# Patient Record
Sex: Female | Born: 1945
Health system: Southern US, Community
[De-identification: ages and names within clinical notes are randomized; demographics above are authoritative.]

## PROBLEM LIST (undated history)

## (undated) DIAGNOSIS — M8580 Other specified disorders of bone density and structure, unspecified site: Secondary | ICD-10-CM

## (undated) DIAGNOSIS — H04123 Dry eye syndrome of bilateral lacrimal glands: Secondary | ICD-10-CM

## (undated) DIAGNOSIS — M25559 Pain in unspecified hip: Secondary | ICD-10-CM

## (undated) DIAGNOSIS — C439 Malignant melanoma of skin, unspecified: Secondary | ICD-10-CM

## (undated) DIAGNOSIS — M810 Age-related osteoporosis without current pathological fracture: Secondary | ICD-10-CM

## (undated) DIAGNOSIS — R251 Tremor, unspecified: Secondary | ICD-10-CM

## (undated) DIAGNOSIS — I456 Pre-excitation syndrome: Secondary | ICD-10-CM

## (undated) DIAGNOSIS — R55 Syncope and collapse: Principal | ICD-10-CM

## (undated) HISTORY — DX: Syncope and collapse: R55

## (undated) HISTORY — PX: TONSILLECTOMY: SUR1361

## (undated) HISTORY — PX: BREAST BIOPSY: SHX20

## (undated) HISTORY — PX: SPINE SURGERY: SHX786

## (undated) HISTORY — DX: Age-related osteoporosis without current pathological fracture: M81.0

## (undated) HISTORY — PX: WISDOM TOOTH EXTRACTION: SHX21

## (undated) HISTORY — DX: Malignant melanoma of skin, unspecified: C43.9

## (undated) HISTORY — DX: Pre-excitation syndrome: I45.6

## (undated) HISTORY — DX: Tremor, unspecified: R25.1

## (undated) HISTORY — PX: TUBAL LIGATION: SHX77

## (undated) HISTORY — DX: Other specified disorders of bone density and structure, unspecified site: M85.80

## (undated) HISTORY — PX: BREAST CYST ASPIRATION: SHX578

## (undated) HISTORY — PX: BUNIONECTOMY: SHX129

## (undated) HISTORY — PX: COLONOSCOPY: SHX174

---

## 1973-10-12 HISTORY — PX: BREAST BIOPSY: SHX20

## 1973-10-12 HISTORY — PX: BREAST EXCISIONAL BIOPSY: SUR124

## 1975-10-13 HISTORY — PX: DILATION AND CURETTAGE OF UTERUS: SHX78

## 1990-10-12 DIAGNOSIS — I456 Pre-excitation syndrome: Secondary | ICD-10-CM

## 1990-10-12 HISTORY — PX: OTHER SURGICAL HISTORY: SHX169

## 1990-10-12 HISTORY — DX: Pre-excitation syndrome: I45.6

## 2009-02-05 ENCOUNTER — Ambulatory Visit: Payer: Self-pay | Admitting: Sports Medicine

## 2009-02-05 DIAGNOSIS — M21619 Bunion of unspecified foot: Secondary | ICD-10-CM

## 2009-02-05 DIAGNOSIS — M79609 Pain in unspecified limb: Secondary | ICD-10-CM | POA: Insufficient documentation

## 2009-02-05 DIAGNOSIS — M7750 Other enthesopathy of unspecified foot: Secondary | ICD-10-CM | POA: Insufficient documentation

## 2009-02-05 DIAGNOSIS — Q667 Congenital pes cavus, unspecified foot: Secondary | ICD-10-CM

## 2009-06-12 HISTORY — PX: MELANOMA EXCISION: SHX5266

## 2009-10-12 DIAGNOSIS — C439 Malignant melanoma of skin, unspecified: Secondary | ICD-10-CM

## 2009-10-12 HISTORY — DX: Malignant melanoma of skin, unspecified: C43.9

## 2013-05-19 ENCOUNTER — Encounter: Payer: Self-pay | Admitting: Obstetrics & Gynecology

## 2013-05-19 ENCOUNTER — Ambulatory Visit (INDEPENDENT_AMBULATORY_CARE_PROVIDER_SITE_OTHER): Payer: BC Managed Care – PPO | Admitting: Obstetrics & Gynecology

## 2013-05-19 ENCOUNTER — Other Ambulatory Visit: Payer: Self-pay | Admitting: Obstetrics & Gynecology

## 2013-05-19 VITALS — BP 126/82 | HR 60 | Resp 16 | Ht 63.75 in | Wt 107.6 lb

## 2013-05-19 DIAGNOSIS — Z1231 Encounter for screening mammogram for malignant neoplasm of breast: Secondary | ICD-10-CM

## 2013-05-19 DIAGNOSIS — Z124 Encounter for screening for malignant neoplasm of cervix: Secondary | ICD-10-CM

## 2013-05-19 DIAGNOSIS — Z1211 Encounter for screening for malignant neoplasm of colon: Secondary | ICD-10-CM

## 2013-05-19 DIAGNOSIS — C439 Malignant melanoma of skin, unspecified: Secondary | ICD-10-CM

## 2013-05-19 DIAGNOSIS — Z01419 Encounter for gynecological examination (general) (routine) without abnormal findings: Secondary | ICD-10-CM

## 2013-05-19 NOTE — Progress Notes (Signed)
67 y.o. G2P2 MarriedCaucasianF here for annual exam.  Moving back to [place] from [facility].  No vaginal bleeding.  Knows she needs colonoscopy.  Fine with me referring her.  Seeing Dr. Mila Palmer.  Has follow-up this fall.  H/O melanoma.  Sees dermatologist in [place]. [place] every six months.    Patient's last menstrual period was 10/12/1997.          Sexually active: yes  The current method of family planning is post menopausal status.    Exercising: yes  walking and weights Smoker:  no  Health Maintenance: Pap:  11/13/11 WNL History of abnormal Pap:  no MMG:  2012 normal Colonoscopy:  none BMD:   2012 osteopenia TDaP:  ? 10 years  Screening Labs: PCP, Hb today: PCP, Urine today: PCP   reports that she has never smoked. She has never used smokeless tobacco. She reports that she drinks about 3.5 ounces of alcohol per week. She reports that she does not use illicit drugs.  Past Medical History  Diagnosis Date  . Wolff-Parkinson-White (WPW) syndrome 1992    heart ablation  . Osteopenia     was on fosamax for awhile  . Tremor     Past Surgical History  Procedure Laterality Date  . Heart ablation  1992  . Tonsillectomy    . Breast biopsy  1975    benign lump  . Breast cyst aspiration      benign    Current Outpatient Prescriptions  Medication Sig Dispense Refill  . aspirin 325 MG tablet Take 325 mg by mouth daily.      . Cholecalciferol (VITAMIN D) 2000 UNITS CAPS Take by mouth daily.      . Coenzyme Q10 (CO Q 10 PO) Take by mouth daily.      . fish oil-omega-3 fatty acids 1000 MG capsule Take 2 g by mouth daily.      . Multiple Vitamins-Minerals (MULTIVITAL PO) Take by mouth daily.      Marland Kitchen OVER THE COUNTER MEDICATION Joint stemi flex       No current facility-administered medications for this visit.    Family History  Problem Relation Age of Onset  . Lung cancer Father 63  . Hodgkin's lymphoma Paternal Grandfather   . Hypertension Mother   . Hyperlipidemia Mother    . Congestive Heart Failure Mother   . Hypertension Maternal Grandmother   . Congestive Heart Failure Paternal Grandmother   . Hyperlipidemia Maternal Grandmother   . Tremor Daughter     ROS:  Pertinent items are noted in HPI.  Otherwise, a comprehensive ROS was negative.  Exam:   BP 126/82  Pulse 60  Resp 16  Ht 5' 3.75" (1.619 m)  Wt 107 lb 9.6 oz (48.807 kg)  BMI 18.62 kg/m2  LMP 10/12/1997   Height: 5' 3.75" (161.9 cm)  Ht Readings from Last 3 Encounters:  05/19/13 5' 3.75" (1.619 m)  02/05/09 5\' 3"  (1.6 m)    General appearance: alert, cooperative and appears stated age Head: Normocephalic, without obvious abnormality, atraumatic Neck: no adenopathy, supple, symmetrical, trachea midline and thyroid normal to inspection and palpation Lungs: clear to auscultation bilaterally Breasts: normal appearance, no masses or tenderness Heart: regular rate and rhythm Abdomen: soft, non-tender; bowel sounds normal; no masses,  no organomegaly Extremities: extremities normal, atraumatic, no cyanosis or edema Skin: Skin color, texture, turgor normal. No rashes or lesions Lymph nodes: Cervical, supraclavicular, and axillary nodes normal. No abnormal inguinal nodes palpated Neurologic: Grossly normal  Pelvic: External genitalia:  no lesions              Urethra:  normal appearing urethra with no masses, tenderness or lesions              Bartholins and Skenes: normal                 Vagina: normal appearing vagina with normal color and discharge, no lesions              Cervix: no lesions              Pap taken: yes Bimanual Exam:  Uterus:  normal size, contour, position, consistency, mobility, non-tender              Adnexa: normal adnexa and no mass, fullness, tenderness               Rectovaginal: Confirms               Anus:  normal sphincter tone, no lesions  A:  Well Woman with normal exam PMP, on HRT but weaning self off Osteopenia H/O melanoma  P:   Mammogram yearly.   Appt scheduled for patient--06/05/13 @ 2:30pm. pap smear with HR HPV today. Dr. Paulino Rily gave rx for [place].  D/w pt weaning.  Recommended she have MMG. Recommended Tdap.  Declines today. Referral to Dr. Loreta Ave for screening colonoscopy. [facility]. Seeing Dr. Paulino Rily in fall.  Labs then. return annually or prn  An After Visit Summary was printed and given to the patient.

## 2013-05-19 NOTE — Patient Instructions (Signed)

## 2013-05-23 ENCOUNTER — Telehealth: Payer: Self-pay | Admitting: Obstetrics & Gynecology

## 2013-05-23 NOTE — Telephone Encounter (Signed)
Patient called to let know of appt. With Dr. Loreta Ave and Dermatology to be after the Sept. 15th and afternoon time appt.

## 2013-05-23 NOTE — Telephone Encounter (Signed)
Calling about the her appointment with Dr. Loreta Ave

## 2013-06-05 ENCOUNTER — Ambulatory Visit: Payer: Self-pay

## 2013-06-20 ENCOUNTER — Telehealth: Payer: Self-pay | Admitting: Obstetrics & Gynecology

## 2013-06-20 NOTE — Telephone Encounter (Signed)
Patient called to speak to [facility] said she called her yesterday. Did not see a phone note.

## 2013-06-20 NOTE — Telephone Encounter (Signed)
9/9 [id number]//kn

## 2013-06-21 ENCOUNTER — Ambulatory Visit
Admission: RE | Admit: 2013-06-21 | Discharge: 2013-06-21 | Disposition: A | Discharge status: Home / Self Care | Payer: Medicare Other | Source: Ambulatory Visit | Attending: Obstetrics & Gynecology | Admitting: Obstetrics & Gynecology

## 2013-06-21 DIAGNOSIS — Z1231 Encounter for screening mammogram for malignant neoplasm of breast: Secondary | ICD-10-CM

## 2013-06-29 ENCOUNTER — Encounter: Payer: Self-pay | Admitting: Obstetrics & Gynecology

## 2013-07-12 ENCOUNTER — Telehealth: Payer: Self-pay

## 2013-07-12 NOTE — Telephone Encounter (Signed)
Appointment made for 11/13/13 with Dr Karlyn Agee. It was going to be over one year to get in with Dr Swaziland. Talked with patient, this appointment date did not work for her so she is going to call them to reschedule and will call us back with the date and MD she will be seeing.

## 2013-07-12 NOTE — Telephone Encounter (Signed)
Message copied by Elisha Headland on Wed Jul 12, 2013 10:43 AM ------      Message from: Jerene Bears      Created: Sat Jul 01, 2013  3:05 AM       Megan Taylor was working on this because pt wanted to see Dr. Swaz[place]and and that appt was going to be a year out.  I don't know if she ever made it with anyone else.  Can you call [facility] and make appt for pt?  Thanks.            MSM      ----- Message -----         From: Lorrene Reid, CMA         Sent: 06/29/2013   1:21 PM           To: Annamaria Boots, MD            Spoke with patient-states did have MMG done on 06/21/13 at the [facility] center of [place]. I called them to get report, they are currently waiting to get a copy of her last one that was done in [place]. Also, patient states has colonoscopy scheduled 08/30/13. The only thing she is waiting on is a referral to Dr Amy Swaziland, dermatologist. She was not sure if she needs to make this appointment or if we were working on it. Please advise.        ------

## 2014-08-13 ENCOUNTER — Encounter: Payer: Self-pay | Admitting: Obstetrics & Gynecology

## 2014-09-11 ENCOUNTER — Ambulatory Visit (INDEPENDENT_AMBULATORY_CARE_PROVIDER_SITE_OTHER): Payer: Medicare Other | Admitting: Podiatry

## 2014-09-11 ENCOUNTER — Ambulatory Visit (INDEPENDENT_AMBULATORY_CARE_PROVIDER_SITE_OTHER): Payer: Medicare Other

## 2014-09-11 ENCOUNTER — Encounter: Payer: Self-pay | Admitting: Podiatry

## 2014-09-11 VITALS — BP 125/74 | HR 74 | Resp 16

## 2014-09-11 DIAGNOSIS — M2010 Hallux valgus (acquired), unspecified foot: Secondary | ICD-10-CM

## 2014-09-11 DIAGNOSIS — M2042 Other hammer toe(s) (acquired), left foot: Secondary | ICD-10-CM

## 2014-09-11 NOTE — Progress Notes (Signed)
   Subjective:    Patient ID: Megan Taylor, female    DOB: 1946-01-27, 68 y.o.   MRN: [id number]  HPI Comments: "I have these bunions and a toe that really hurts"  Patient c/o aching plantar forefoot and 1st MPJ bilateral for several years. Shoes are becoming uncomfortable. Also, the 2nd toe left sits on top of 1st toe left.   Foot Pain Associated symptoms include arthralgias.  Toe Pain       Review of Systems  Musculoskeletal: Positive for arthralgias.  All other systems reviewed and are negative.      Objective:   Physical Exam: I have reviewed her past metal history medications allergies surgery social history and review of systems. Pulses are strongly palpable bilateral. Neurologic sensorium is intact per Semmes-Weinstein monofilament. Deep tendon reflexes are intact bilateral and muscle strength is 5 over 5 dorsiflexion plantar flexors and inverters and everters all intrinsic musculature is intact. Orthopedic evaluation demonstrates hallux abductovalgus deformity bilateral left greater than right. She also has contracted second metatarsophalangeal joint with hammertoe deformity which appears to be flexible at the PIPJ second left. Radiographic evaluation confirms increase in the first intermetatarsal angle bilateral and elongated second metatarsals bilateral. This is resulting in the hammertoe deformity which does appear to be fixed in dorsiflexion but flexible otherwise.        Assessment & Plan:  Assessment: Hallux abductovalgus deformity plantar flexed elongated second metatarsal left hammertoe deformity second left.  Plan: Discussed etiology pathology conservative versus surgical therapies. We discussed surgery in great detail today. She will follow up with [place] in early spring for surgical consult.

## 2014-09-11 NOTE — Patient Instructions (Signed)

## 2014-09-21 ENCOUNTER — Ambulatory Visit: Payer: BC Managed Care – PPO | Admitting: Podiatrist

## 2014-10-29 ENCOUNTER — Encounter: Payer: Self-pay | Admitting: Nurse Practitioner

## 2014-10-29 ENCOUNTER — Ambulatory Visit (INDEPENDENT_AMBULATORY_CARE_PROVIDER_SITE_OTHER): Payer: Medicare Other | Admitting: Nurse Practitioner

## 2014-10-29 VITALS — BP 116/72 | HR 80 | Resp 18 | Ht 64.0 in | Wt 107.2 lb

## 2014-10-29 DIAGNOSIS — Z01419 Encounter for gynecological examination (general) (routine) without abnormal findings: Secondary | ICD-10-CM

## 2014-10-29 NOTE — Patient Instructions (Signed)

## 2014-10-29 NOTE — Progress Notes (Signed)
Encounter reviewed by Dr. Azazel Franze Silva.  

## 2014-10-29 NOTE — Progress Notes (Signed)
69 y.o. G2P2 Married  Caucasian Fe here for annual exam.  Off HRT 09/2013.  Mother passed 08/2013.  She complains that her work out sweats and long underwear have caused an irritation at the clitoral area.  Does not sting or burn just irritated.  She is now going to wear a pad for protection.  Patient's last menstrual period was 10/12/1997.          Sexually active: Yes.    The current method of family planning is post menopausal status.    Exercising: Yes.    Walking, weights 3-5x/wk, 3x/wk Smoker:  no  Health Maintenance: Pap: 05/19/13 NEG HR HPV negative MMG:  06/21/13 Bi-Rads 1: Negative Colonoscopy:  10/2013- Normal f/u in 10 years (2026) BMD:   4-5 years ago TDaP: over 10 years  Shingles vaccine: 08/09/14 Labs: Jonathon Jordan, MD ; Urine: Declined   reports that she has never smoked. She has never used smokeless tobacco. She reports that she drinks about 3.0 - 3.6 oz of alcohol per week. She reports that she does not use illicit drugs.  Past Medical History  Diagnosis Date  . Wolff-Parkinson-White (WPW) syndrome 1992    heart ablation  . Osteopenia     was on fosamax for awhile  . Tremor   . Melanoma     Past Surgical History  Procedure Laterality Date  . Heart ablation  1992  . Tonsillectomy    . Breast biopsy  1975    benign lump  . Breast cyst aspiration      benign  . Melanoma excision Right 06/2009    right ankle  . Dilation and curettage of uterus  1977    with BTL    Current Outpatient Prescriptions  Medication Sig Dispense Refill  . aspirin 81 MG tablet Take 81 mg by mouth daily.    . Cholecalciferol (VITAMIN D) 2000 UNITS CAPS Take by mouth daily.    . Coenzyme Q10 (CO Q 10 PO) Take by mouth daily.    . fish oil-omega-3 fatty acids 1000 MG capsule Take 2 g by mouth daily.    . Multiple Vitamins-Minerals (MULTIVITAL PO) Take by mouth daily.    Marland Kitchen OVER THE COUNTER MEDICATION Joint stemi flex    . Probiotic Product (PROBIOTIC DAILY PO) Take by mouth.     No  current facility-administered medications for this visit.    Family History  Problem Relation Age of Onset  . Lung cancer Father 40  . Hodgkin's lymphoma Paternal Grandfather   . Hypertension Mother   . Hyperlipidemia Mother   . Congestive Heart Failure Mother   . Hypertension Maternal Grandmother   . Hyperlipidemia Maternal Grandmother   . Congestive Heart Failure Paternal Grandmother   . Tremor Daughter     ROS:  Pertinent items are noted in HPI.  Otherwise, a comprehensive ROS was negative.  Exam:   BP 116/72 mmHg  Pulse 80  Resp 18  Ht 5\' 4"  (1.626 m)  Wt 107 lb 3.2 oz (48.626 kg)  BMI 18.39 kg/m2  LMP 10/12/1997 Height: 5\' 4"  (162.6 cm) Ht Readings from Last 3 Encounters:  10/29/14 5\' 4"  (1.626 m)  05/19/13 5' 3.75" (1.619 m)  02/05/09 5\' 3"  (1.6 m)    General appearance: alert, cooperative and appears stated age Head: Normocephalic, without obvious abnormality, atraumatic Neck: no adenopathy, supple, symmetrical, trachea midline and thyroid normal to inspection and palpation Lungs: clear to auscultation bilaterally Breasts: normal appearance, no masses or tenderness Heart: regular rate  and rhythm Abdomen: soft, non-tender; no masses,  no organomegaly Extremities: extremities normal, atraumatic, no cyanosis or edema Skin: Skin color, texture, turgor normal. No rashes or lesions Lymph nodes: Cervical, supraclavicular, and axillary nodes normal. No abnormal inguinal nodes palpated Neurologic: Grossly normal   Pelvic: External genitalia:  no lesions other than abrasive area above th clitoral hood that is healing.  Non tender and does not look like HSV.              Urethra:  normal appearing urethra with no masses, tenderness or lesions              Bartholin's and Skene's: normal                 Vagina: normal appearing vagina with normal color and discharge, no lesions              Cervix: anteverted              Pap taken: No. Bimanual Exam:  Uterus:  normal  size, contour, position, consistency, mobility, non-tender              Adnexa: no mass, fullness, tenderness               Rectovaginal: Confirms               Anus:  normal sphincter tone, no lesions  Chaperone present: NO  A:  Well Woman with normal exam  Postmenopausal off HRT 12/14  Osteopenia  H/O melanoma on right ankle 5 years ago  Abrasive area above the clitoris  P:   Reviewed health and wellness pertinent to exam  Pap smear not taken today  Mammogram is due now and will schedule 3D  May use OTC Neosporin cream if needed for abrasive area  Counseled on breast self exam, mammography screening, adequate intake of calcium and vitamin D, diet and exercise return annually or prn  An After Visit Summary was printed and given to the patient. Will get copy of BMD.

## 2014-12-18 ENCOUNTER — Encounter: Payer: Self-pay | Admitting: Podiatry

## 2014-12-18 ENCOUNTER — Ambulatory Visit (INDEPENDENT_AMBULATORY_CARE_PROVIDER_SITE_OTHER): Payer: Medicare Other | Admitting: Podiatry

## 2014-12-18 VITALS — BP 129/75 | HR 73

## 2014-12-18 DIAGNOSIS — M779 Enthesopathy, unspecified: Secondary | ICD-10-CM

## 2014-12-18 DIAGNOSIS — M2010 Hallux valgus (acquired), unspecified foot: Secondary | ICD-10-CM | POA: Diagnosis not present

## 2014-12-18 DIAGNOSIS — M778 Other enthesopathies, not elsewhere classified: Secondary | ICD-10-CM

## 2014-12-18 DIAGNOSIS — M7752 Other enthesopathy of left foot: Secondary | ICD-10-CM | POA: Diagnosis not present

## 2014-12-18 NOTE — Progress Notes (Signed)
   Subjective:    Patient ID: Megan Taylor, female    DOB: [birth year], 69 y.o.   MRN: [id number]  HPI wants to discuss bunion surgery   Review of Systems     Objective:   Physical Exam: I have reviewed her past medical history medications allergies surgery social history and review of systems. At this point she is still having severe pain about the first metatarsophalangeal joint and the second metatarsophalangeal joint. Conservative therapies have failed at this point and she is requesting surgical intervention.  Pulses are strongly palpable left foot. Hallux abductovalgus deformity is present with painful range of motion of the first metatarsophalangeal joint. Plantarflexed elongated second metatarsal with medial dislocation of a flexible toe second digit left foot. Radiographs were reviewed and confirm this. Cutaneous evaluation demonstrates supple well-hydrated cutis no erythema edema saline as drainage or odor.        Assessment & Plan:  Assessment: Wife altering changes with hallux abductovalgus deformity of her left foot with an elongated plantarflexed second metatarsal resulting in capsulitis of the second metatarsophalangeal joint.  Plan: We discussed the etiology pathology conservatives versus surgical therapies. Due to failure of conservative therapies at this point I have suggested surgical intervention regarding [facility] [place] bunion repair and screw fixation left foot. Second metatarsal osteotomy with double screw fixation left foot. We went over the consent form today line by line number by number giving her ample time to ask questions she saw fit regarding these procedures to the best of my ability in layman's terms I answered all of these questions. We did discuss the possible postoperative patient's which may include but are not limited to postop pain bleeding swelling infection overcorrection under correction need for further surgery loss of digit loss of limb loss of life. She  signed all 3 pages of the consent form and was dispensed a cam walker for postop. I will follow up with her in the near future for surgical intervention.

## 2014-12-18 NOTE — Patient Instructions (Signed)
Pre-Operative Instructions  Congratulations, you have decided to take an important step to improving your quality of life.  You can be assured that the doctors of [facility] will be with you every step of the way.  1. Plan to be at the surgery center/hospital at least 1 (one) hour prior to your scheduled time unless otherwise directed by the surgical center/hospital staff.  You must have a responsible adult accompany you, remain during the surgery and drive you home.  Make sure you have directions to the surgical center/hospital and know how to get there on time. 2. For hospital based surgery you will need to obtain a history and physical form from your family physician within 1 month prior to the date of surgery- we will give you a form for you primary physician.  3. We make every effort to accommodate the date you request for surgery.  There are however, times where surgery dates or times have to be moved.  We will contact you as soon as possible if a change in schedule is required.   4. No Aspirin/Ibuprofen for one week before surgery.  If you are on aspirin, any non-steroidal anti-inflammatory medications (Mobic, Aleve, Ibuprofen) you should stop taking it 7 days prior to your surgery.  You make take Tylenol  For pain prior to surgery.  5. Medications- If you are taking daily heart and blood pressure medications, seizure, reflux, allergy, asthma, anxiety, pain or diabetes medications, make sure the surgery center/hospital is aware before the day of surgery so they may notify you which medications to take or avoid the day of surgery. 6. No food or drink after midnight the night before surgery unless directed otherwise by surgical center/hospital staff. 7. No alcoholic beverages 24 hours prior to surgery.  No smoking 24 hours prior to or 24 hours after surgery. 8. Wear loose pants or shorts- loose enough to fit over bandages, boots, and casts. 9. No slip on shoes, sneakers are best. 10. Bring  your boot with you to the surgery center/hospital.  Also bring crutches or a walker if your physician has prescribed it for you.  If you do not have this equipment, it will be provided for you after surgery. 11. If you have not been contracted by the surgery center/hospital by the day before your surgery, call to confirm the date and time of your surgery. 12. Leave-time from work may vary depending on the type of surgery you have.  Appropriate arrangements should be made prior to surgery with your employer. 13. Prescriptions will be provided immediately following surgery by your doctor.  Have these filled as soon as possible after surgery and take the medication as directed. 14. Remove nail polish on the operative foot. 15. Wash the night before surgery.  The night before surgery wash the foot and leg well with the antibacterial soap provided and water paying special attention to beneath the toenails and in between the toes.  Rinse thoroughly with water and dry well with a towel.  Perform this wash unless told not to do so by your physician.  Enclosed: 1 Ice pack (please put in freezer the night before surgery)   1 Hibiclens skin cleaner   Pre-op Instructions  If you have any questions regarding the instructions, do not hesitate to call our office.  [place]: [address] [place], [place] [address] [phone]  [place]: [address] [place], [place] [address] [phone]  [place]: [address]  [place], [place] [address] [phone]  Dr. Richard   Tuchman DPM, Dr. Norman Regal DPM Dr. Richard Sikora DPM, Dr. M. Todd Hyatt DPM, Dr. Kathryn Egerton DPM 

## 2015-01-17 ENCOUNTER — Other Ambulatory Visit: Payer: Self-pay | Admitting: Podiatry

## 2015-01-17 MED ORDER — CLINDAMYCIN HCL 150 MG PO CAPS: 150.0000 mg | ORAL_CAPSULE | Freq: Three times a day (TID) | ORAL | Status: DC

## 2015-01-17 MED ORDER — PROMETHAZINE HCL 25 MG PO TABS: 25.0000 mg | ORAL_TABLET | Freq: Three times a day (TID) | ORAL | Status: DC | PRN

## 2015-01-17 MED ORDER — OXYCODONE-ACETAMINOPHEN 10-325 MG PO TABS: ORAL_TABLET | ORAL | Status: DC

## 2015-01-18 DIAGNOSIS — M2012 Hallux valgus (acquired), left foot: Secondary | ICD-10-CM | POA: Diagnosis not present

## 2015-01-18 DIAGNOSIS — M21542 Acquired clubfoot, left foot: Secondary | ICD-10-CM | POA: Diagnosis not present

## 2015-01-24 ENCOUNTER — Encounter: Payer: Self-pay | Admitting: Podiatry

## 2015-01-24 ENCOUNTER — Ambulatory Visit (INDEPENDENT_AMBULATORY_CARE_PROVIDER_SITE_OTHER): Payer: Medicare Other | Admitting: Podiatry

## 2015-01-24 ENCOUNTER — Ambulatory Visit (INDEPENDENT_AMBULATORY_CARE_PROVIDER_SITE_OTHER): Payer: Medicare Other

## 2015-01-24 VITALS — BP 119/78 | HR 78 | Resp 16

## 2015-01-24 DIAGNOSIS — M2012 Hallux valgus (acquired), left foot: Secondary | ICD-10-CM

## 2015-01-24 DIAGNOSIS — Z9889 Other specified postprocedural states: Secondary | ICD-10-CM

## 2015-01-24 NOTE — Progress Notes (Signed)
She presents today 6 days status post [facility] bunion repair left foot second metatarsal osteotomy left foot. She states this seems to be doing pretty good. She denies fever chills nausea vomiting muscle aches pain shortness of breath or chest pain.  Objective: Vital signs are stable she is alert and oriented 3 presents in her Cam Walker and a dry sterile compressive dressing which was removed demonstrates no erythema edema saline as drainage or odor she has great range of motion of the first metatarsophalangeal joint passively and actively. Radiographic evaluation demonstrates a well placed osteotomy with internal fixation both the first metatarsal and the second metatarsal.  Assessment: Well-healing surgical foot date of surgery 01/18/2015.  Plan: [place] today dressed or compressive dressing encouraged range of motion exercises follow up with her in 1 week.

## 2015-01-31 ENCOUNTER — Ambulatory Visit (INDEPENDENT_AMBULATORY_CARE_PROVIDER_SITE_OTHER): Payer: Medicare Other | Admitting: Podiatry

## 2015-01-31 DIAGNOSIS — M2012 Hallux valgus (acquired), left foot: Secondary | ICD-10-CM

## 2015-01-31 DIAGNOSIS — M2042 Other hammer toe(s) (acquired), left foot: Secondary | ICD-10-CM

## 2015-01-31 DIAGNOSIS — Z9889 Other specified postprocedural states: Secondary | ICD-10-CM

## 2015-01-31 DIAGNOSIS — M79673 Pain in unspecified foot: Secondary | ICD-10-CM

## 2015-02-05 NOTE — Progress Notes (Signed)
She presents today 2 weeks status post [facility] bunion repair as a metatarsal osteotomy left foot. She denies fevers chills nausea vomiting muscle aches and pains.  Objective: Vital signs are stable she is alert and oriented 3. Minimal edema no erythema saline shortage or odor sutures are intact margins well coapted. We removed the ends of the sutures today.  Assessment: Well-healing surgical foot left.  Plan: Place her in a compression anklet [place] she will follow up with her in 2 weeks.

## 2015-02-14 ENCOUNTER — Ambulatory Visit (INDEPENDENT_AMBULATORY_CARE_PROVIDER_SITE_OTHER): Payer: Medicare Other

## 2015-02-14 ENCOUNTER — Ambulatory Visit (INDEPENDENT_AMBULATORY_CARE_PROVIDER_SITE_OTHER): Payer: Medicare Other | Admitting: Podiatry

## 2015-02-14 ENCOUNTER — Encounter: Payer: Self-pay | Admitting: Podiatry

## 2015-02-14 VITALS — BP 132/71 | HR 79 | Resp 16

## 2015-02-14 DIAGNOSIS — M2042 Other hammer toe(s) (acquired), left foot: Secondary | ICD-10-CM

## 2015-02-14 DIAGNOSIS — M2012 Hallux valgus (acquired), left foot: Secondary | ICD-10-CM

## 2015-02-14 DIAGNOSIS — Z9889 Other specified postprocedural states: Secondary | ICD-10-CM

## 2015-02-14 NOTE — Progress Notes (Signed)
She presents today status post [facility] bunion repair and second metatarsal osteotomy left foot. Date of surgery was 01/18/2015. She states I am doing good.  Objective: Vital signs are stable she is alert and oriented 3. No erythema edema saline as drainage or odor. She has full range of motion of the first metatarsophalangeal joint. I see no signs of infection. Radiographs confirmed today well placed osteotomy with internal fixation.  Assessment: Well-healed surgical flip left.  Plan: I would allow her to get into a pair of tennis shoes and I will follow-up with her in 1 month for evaluation and another set of x-rays.

## 2015-03-14 ENCOUNTER — Encounter: Payer: Self-pay | Admitting: Podiatry

## 2015-03-14 ENCOUNTER — Ambulatory Visit (INDEPENDENT_AMBULATORY_CARE_PROVIDER_SITE_OTHER): Payer: Medicare Other | Admitting: Podiatry

## 2015-03-14 ENCOUNTER — Ambulatory Visit (INDEPENDENT_AMBULATORY_CARE_PROVIDER_SITE_OTHER): Payer: Medicare Other

## 2015-03-14 VITALS — BP 123/70 | HR 64 | Resp 16

## 2015-03-14 DIAGNOSIS — Z9889 Other specified postprocedural states: Secondary | ICD-10-CM

## 2015-03-14 DIAGNOSIS — M2012 Hallux valgus (acquired), left foot: Secondary | ICD-10-CM

## 2015-03-14 NOTE — Progress Notes (Signed)
She presents today for follow-up of her bunion repair second metatarsal osteotomy and hammertoe repair left. She states that she is doing wonderful and she is back into her regular shoes for the majority of the time the switches back to her Darco shoe occasionally.  Objective: Low signs are stable alert and oriented 3. Pulses are palpable. She great range of motion minimal edema no erythema cellulitis drainage or odor. Radiographs demonstrate well-healing surgical foot.  Assessment: Well-healing surgical foot date of surgery 04/08/[place] bunion repair second metatarsal osteotomy with screws.  Plan: Follow up with me in 1 month as necessary otherwise discharged.

## 2015-04-11 ENCOUNTER — Ambulatory Visit (INDEPENDENT_AMBULATORY_CARE_PROVIDER_SITE_OTHER): Payer: Medicare Other | Admitting: Podiatry

## 2015-04-11 ENCOUNTER — Ambulatory Visit: Payer: Self-pay

## 2015-04-11 ENCOUNTER — Encounter: Payer: Self-pay | Admitting: Podiatry

## 2015-04-11 VITALS — BP 124/69 | HR 74 | Resp 16

## 2015-04-11 DIAGNOSIS — Z9889 Other specified postprocedural states: Secondary | ICD-10-CM

## 2015-04-11 DIAGNOSIS — M2042 Other hammer toe(s) (acquired), left foot: Secondary | ICD-10-CM

## 2015-04-11 DIAGNOSIS — M2012 Hallux valgus (acquired), left foot: Secondary | ICD-10-CM

## 2015-04-11 MED ORDER — MELOXICAM 7.5 MG PO TABS
7.5000 mg | ORAL_TABLET | Freq: Every day | ORAL | Status: DC
Start: 2015-04-11 — End: 2016-02-18

## 2015-04-14 NOTE — Progress Notes (Signed)
She presents today nearly 2 months status post and also bunion repair and a second metatarsal osteotomy. She continues to wear her digital night splint on a regular basis. She states that she started to have pain on the dorsal and dorsolateral aspect of the left foot at this time the surgical sites are perfectly fine she says. She denies any trauma.  Objective: Vital signs are stable she is alert and oriented 3. Pulses are palpable. She has great range of motion of both the first and second metatarsophalangeal joints and second toe appears to be sitting much more rectus than previously noted. Obviously she was utilizing her digital splint at nighttime. She has pain on palpation to the fourth and fifth metatarsal bases at the cuboid articulation. This is indicative of lateral compensatory syndrome from off walking her surgical site.  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 Radiographs do not demonstrate any type of fractures.  Plan: Encouraged her to get back into her Darco shoe for a while it necessary. Also she is to continue the use of her digital night splint and will follow up with me in 4-6 weeks. I also ordered her a prescription for meloxicam 7.5 mg.

## 2015-05-23 ENCOUNTER — Ambulatory Visit: Payer: Medicare Other | Admitting: Podiatry

## 2015-09-03 ENCOUNTER — Other Ambulatory Visit: Payer: Self-pay

## 2015-09-03 DIAGNOSIS — Z1231 Encounter for screening mammogram for malignant neoplasm of breast: Secondary | ICD-10-CM

## 2015-09-27 ENCOUNTER — Ambulatory Visit
Admission: RE | Admit: 2015-09-27 | Discharge: 2015-09-27 | Disposition: A | Discharge status: Home / Self Care | Payer: Medicare Other | Source: Ambulatory Visit

## 2015-09-27 DIAGNOSIS — Z1231 Encounter for screening mammogram for malignant neoplasm of breast: Secondary | ICD-10-CM

## 2016-02-06 ENCOUNTER — Ambulatory Visit: Payer: Self-pay | Admitting: Orthopedic Surgery

## 2016-02-12 ENCOUNTER — Encounter (HOSPITAL_COMMUNITY): Payer: Self-pay

## 2016-02-12 ENCOUNTER — Encounter (HOSPITAL_COMMUNITY)
Admission: RE | Admit: 2016-02-12 | Discharge: 2016-02-12 | Disposition: A | Discharge status: Home / Self Care | Payer: Medicare Other | Source: Ambulatory Visit | Attending: Orthopedic Surgery | Admitting: Orthopedic Surgery

## 2016-02-12 DIAGNOSIS — Z8669 Personal history of other diseases of the nervous system and sense organs: Secondary | ICD-10-CM | POA: Diagnosis not present

## 2016-02-12 DIAGNOSIS — Z01818 Encounter for other preprocedural examination: Secondary | ICD-10-CM | POA: Diagnosis present

## 2016-02-12 HISTORY — DX: Dry eye syndrome of bilateral lacrimal glands: H04.123

## 2016-02-12 HISTORY — DX: Pain in unspecified hip: M25.559

## 2016-02-12 LAB — CBC
HCT: 42.2 % (ref 36.0–46.0)
Hemoglobin: 14.4 g/dL (ref 12.0–15.0)
MCH: 31.6 pg (ref 26.0–34.0)
MCHC: 34.1 g/dL (ref 30.0–36.0)
MCV: 92.7 fL (ref 78.0–100.0)
PLATELETS: 220 10*3/uL (ref 150–400)
RBC: 4.55 MIL/uL (ref 3.87–5.11)
RDW: 12.6 % (ref 11.5–15.5)
WBC: 5.5 10*3/uL (ref 4.0–10.5)

## 2016-02-12 LAB — SURGICAL PCR SCREEN
MRSA, PCR: NEGATIVE
Staphylococcus aureus: NEGATIVE

## 2016-02-12 NOTE — Pre-Procedure Instructions (Signed)
EKG done today.

## 2016-02-12 NOTE — Patient Instructions (Addendum)
CHAD SALVESEN  02/12/2016   Your procedure is scheduled on: 02-17-16  Report to [facility]  Entrance take [facility]  elevators to 3rd floor to  [place] at  2:00PM.  Call this number if you have problems the morning of surgery [phone]   Remember: ONLY 1 PERSON MAY GO WITH YOU TO SHORT STAY TO GET  READY MORNING OF [place].  Do not eat food or drink liquids :After Midnight.  Take these medicines the morning of surgery with A SIP OF WATER: Tylenol,Tramadol - if need.   _  DO NOT TAKE ANY DIABETIC MEDICATIONS DAY OF YOUR SURGERY                               You may not have any metal on your body including hair pins and              piercings  Do not wear jewelry, make-up, lotions, powders or perfumes, deodorant             Do not wear nail polish.  Do not shave  48 hours prior to surgery.              Men may shave face and neck.   Do not bring valuables to the hospital. [place].  Contacts, dentures or bridgework may not be worn into surgery.  Leave suitcase in the car. After surgery it may be brought to your room.     Patients discharged the day of surgery will not be allowed to drive home.  Name and phone number of your driver:Robert-spouse [phone] cell  Special Instructions: N/A              Please read over the following fact sheets you were given: _____________________________________________________________________             [facility] - Preparing for Surgery Before surgery, you can play an important role.  Because skin is not sterile, your skin needs to be as free of germs as possible.  You can reduce the number of germs on your skin by washing with CHG (chlorahexidine gluconate) soap before surgery.  CHG is an antiseptic cleaner which kills germs and bonds with the skin to continue killing germs even after washing. Please DO NOT use if you have an allergy to CHG or  antibacterial soaps.  If your skin becomes reddened/irritated stop using the CHG and inform your nurse when you arrive at Short Stay. Do not shave (including legs and underarms) for at least 48 hours prior to the first CHG shower.  You may shave your face/neck. Please follow these instructions carefully:  1.  Shower with CHG Soap the night before surgery and the  morning of Surgery.  2.  If you choose to wash your hair, wash your hair first as usual with your  normal  shampoo.  3.  After you shampoo, rinse your hair and body thoroughly to remove the  shampoo.                           4.  Use CHG as you would any other liquid soap.  You can apply chg directly  to  the skin and wash                       Gently with a scrungie or clean washcloth.  5.  Apply the CHG Soap to your body ONLY FROM THE NECK DOWN.   Do not use on face/ open                           Wound or open sores. Avoid contact with eyes, ears mouth and genitals (private parts).                       Wash face,  Genitals (private parts) with your normal soap.             6.  Wash thoroughly, paying special attention to the area where your surgery  will be performed.  7.  Thoroughly rinse your body with warm water from the neck down.  8.  DO NOT shower/wash with your normal soap after using and rinsing off  the CHG Soap.                9.  Pat yourself dry with a clean towel.            10.  Wear clean pajamas.            11.  Place clean sheets on your bed the night of your first shower and do not  sleep with pets. Day of Surgery : Do not apply any lotions/deodorants the morning of surgery.  Please wear clean clothes to the hospital/surgery center.  FAILURE TO FOLLOW THESE INSTRUCTIONS MAY RESULT IN THE CANCELLATION OF YOUR SURGERY PATIENT SIGNATURE_________________________________  NURSE SIGNATURE__________________________________  ________________________________________________________________________   Adam Phenix  An incentive spirometer is a tool that can help keep your lungs clear and active. This tool measures how well you are filling your lungs with each breath. Taking long deep breaths may help reverse or decrease the chance of developing breathing (pulmonary) problems (especially infection) following:  A long period of time when you are unable to move or be active. BEFORE THE PROCEDURE   If the spirometer includes an indicator to show your best effort, your nurse or respiratory therapist will set it to a desired goal.  If possible, sit up straight or lean slightly forward. Try not to slouch.  Hold the incentive spirometer in an upright position. INSTRUCTIONS FOR USE  1. Sit on the edge of your bed if possible, or sit up as far as you can in bed or on a chair. 2. Hold the incentive spirometer in an upright position. 3. Breathe out normally. 4. Place the mouthpiece in your mouth and seal your lips tightly around it. 5. Breathe in slowly and as deeply as possible, raising the piston or the ball toward the top of the column. 6. Hold your breath for 3-5 seconds or for as long as possible. Allow the piston or ball to fall to the bottom of the column. 7. Remove the mouthpiece from your mouth and breathe out normally. 8. Rest for a few seconds and repeat Steps 1 through 7 at least 10 times every 1-2 hours when you are awake. Take your time and take a few normal breaths between deep breaths. 9. The spirometer may include an indicator to show your best effort. Use the indicator as a goal to work toward during each repetition. 10. After each set  of 10 deep breaths, practice coughing to be sure your lungs are clear. If you have an incision (the cut made at the time of surgery), support your incision when coughing by placing a pillow or rolled up towels firmly against it. Once you are able to get out of bed, walk around indoors and cough well. You may stop using the incentive spirometer when  instructed by your caregiver.  RISKS AND COMPLICATIONS  Take your time so you do not get dizzy or light-headed.  If you are in pain, you may need to take or ask for pain medication before doing incentive spirometry. It is harder to take a deep breath if you are having pain. AFTER USE  Rest and breathe slowly and easily.  It can be helpful to keep track of a log of your progress. Your caregiver can provide you with a simple table to help with this. If you are using the spirometer at home, follow these instructions: [place] IF:   You are having difficultly using the spirometer.  You have trouble using the spirometer as often as instructed.  Your pain medication is not giving enough relief while using the spirometer.  You develop fever of 100.5 F (38.1 C) or higher. SEEK IMMEDIATE MEDICAL CARE IF:   You cough up bloody sputum that had not been present before.  You develop fever of 102 F (38.9 C) or greater.  You develop worsening pain at or near the incision site. MAKE SURE YOU:   Understand these instructions.  Will watch your condition.  Will get help right away if you are not doing well or get worse. Document Released: 02/08/2007 Document Revised: 12/21/2011 Document Reviewed: 04/11/2007 [facility] Patient Information 2014 [place], [place].   ________________________________________________________________________

## 2016-02-17 ENCOUNTER — Encounter (HOSPITAL_COMMUNITY)
Admission: RE | Disposition: A | Discharge status: Home / Self Care | Payer: Self-pay | Source: Ambulatory Visit | Attending: Orthopedic Surgery

## 2016-02-17 ENCOUNTER — Ambulatory Visit (HOSPITAL_COMMUNITY): Payer: Medicare Other | Admitting: Registered Nurse

## 2016-02-17 ENCOUNTER — Encounter (HOSPITAL_COMMUNITY): Payer: Self-pay | Admitting: *Deleted

## 2016-02-17 ENCOUNTER — Observation Stay (HOSPITAL_COMMUNITY)
Admission: RE | Admit: 2016-02-17 | Discharge: 2016-02-18 | Disposition: A | Discharge status: Home / Self Care | Payer: Medicare Other | Source: Ambulatory Visit | Attending: Orthopedic Surgery | Admitting: Orthopedic Surgery

## 2016-02-17 DIAGNOSIS — Z79899 Other long term (current) drug therapy: Secondary | ICD-10-CM | POA: Insufficient documentation

## 2016-02-17 DIAGNOSIS — S76911A Strain of unspecified muscles, fascia and tendons at thigh level, right thigh, initial encounter: Secondary | ICD-10-CM | POA: Diagnosis not present

## 2016-02-17 DIAGNOSIS — Z791 Long term (current) use of non-steroidal anti-inflammatories (NSAID): Secondary | ICD-10-CM | POA: Diagnosis not present

## 2016-02-17 DIAGNOSIS — X58XXXA Exposure to other specified factors, initial encounter: Secondary | ICD-10-CM | POA: Diagnosis not present

## 2016-02-17 DIAGNOSIS — M7061 Trochanteric bursitis, right hip: Principal | ICD-10-CM | POA: Insufficient documentation

## 2016-02-17 DIAGNOSIS — M25551 Pain in right hip: Secondary | ICD-10-CM | POA: Diagnosis present

## 2016-02-17 DIAGNOSIS — M6798 Unspecified disorder of synovium and tendon, other site: Secondary | ICD-10-CM | POA: Diagnosis present

## 2016-02-17 DIAGNOSIS — M67951 Unspecified disorder of synovium and tendon, right thigh: Secondary | ICD-10-CM | POA: Diagnosis present

## 2016-02-17 HISTORY — PX: EXCISION/RELEASE BURSA HIP: SHX5014

## 2016-02-17 LAB — CBC
HCT: 41.5 % (ref 36.0–46.0)
HEMOGLOBIN: 14.2 g/dL (ref 12.0–15.0)
MCH: 31.8 pg (ref 26.0–34.0)
MCHC: 34.2 g/dL (ref 30.0–36.0)
MCV: 92.8 fL (ref 78.0–100.0)
PLATELETS: 194 10*3/uL (ref 150–400)
RBC: 4.47 MIL/uL (ref 3.87–5.11)
RDW: 12.6 % (ref 11.5–15.5)
WBC: 7.9 10*3/uL (ref 4.0–10.5)

## 2016-02-17 LAB — CREATININE, SERUM: CREATININE: 0.54 mg/dL (ref 0.44–1.00)

## 2016-02-17 SURGERY — RELEASE, BURSA, TROCHANTERIC
Anesthesia: General | Site: Hip | Laterality: Right

## 2016-02-17 MED ORDER — ACETAMINOPHEN 10 MG/ML IV SOLN
1000.0000 mg | Freq: Once | INTRAVENOUS | Status: AC
  Administered 2016-02-17: 1000 mg via INTRAVENOUS
  Filled 2016-02-17: qty 100

## 2016-02-17 MED ORDER — BUPIVACAINE LIPOSOME 1.3 % IJ SUSP
20.0000 mL | Freq: Once | INTRAMUSCULAR | Status: DC
  Filled 2016-02-17: qty 20

## 2016-02-17 MED ORDER — POVIDONE-IODINE 10 % EX SWAB
2.0000 "application " | Freq: Once | CUTANEOUS | Status: AC
  Administered 2016-02-17: 2 via TOPICAL

## 2016-02-17 MED ORDER — FENTANYL CITRATE (PF) 100 MCG/2ML IJ SOLN
INTRAMUSCULAR | Status: AC
Start: 2016-02-17 — End: 2016-02-18
  Filled 2016-02-17: qty 2

## 2016-02-17 MED ORDER — PHENYLEPHRINE 40 MCG/ML (10ML) SYRINGE FOR IV PUSH (FOR BLOOD PRESSURE SUPPORT)
PREFILLED_SYRINGE | INTRAVENOUS | Status: AC
  Filled 2016-02-17: qty 10

## 2016-02-17 MED ORDER — ONDANSETRON HCL 4 MG/2ML IJ SOLN
4.0000 mg | Freq: Four times a day (QID) | INTRAMUSCULAR | Status: DC | PRN
  Administered 2016-02-17: 4 mg via INTRAVENOUS
  Filled 2016-02-17: qty 2

## 2016-02-17 MED ORDER — FENTANYL CITRATE (PF) 100 MCG/2ML IJ SOLN
INTRAMUSCULAR | Status: AC
  Filled 2016-02-17: qty 2

## 2016-02-17 MED ORDER — METHOCARBAMOL 1000 MG/10ML IJ SOLN: 500.0000 mg | Freq: Four times a day (QID) | INTRAVENOUS | Status: DC | PRN

## 2016-02-17 MED ORDER — SUGAMMADEX SODIUM 200 MG/2ML IV SOLN
INTRAVENOUS | Status: AC
  Filled 2016-02-17: qty 2

## 2016-02-17 MED ORDER — ONDANSETRON HCL 4 MG/2ML IJ SOLN: 4.0000 mg | Freq: Once | INTRAMUSCULAR | Status: DC | PRN

## 2016-02-17 MED ORDER — DEXAMETHASONE SODIUM PHOSPHATE 10 MG/ML IJ SOLN
INTRAMUSCULAR | Status: DC | PRN
  Administered 2016-02-17: 10 mg via INTRAVENOUS

## 2016-02-17 MED ORDER — BUPIVACAINE LIPOSOME 1.3 % IJ SUSP
INTRAMUSCULAR | Status: DC | PRN
  Administered 2016-02-17: 20 mL

## 2016-02-17 MED ORDER — PROPOFOL 10 MG/ML IV BOLUS
INTRAVENOUS | Status: DC | PRN
  Administered 2016-02-17: 140 mg via INTRAVENOUS

## 2016-02-17 MED ORDER — ENOXAPARIN SODIUM 40 MG/0.4ML ~~LOC~~ SOLN
40.0000 mg | SUBCUTANEOUS | Status: DC
  Administered 2016-02-18: 40 mg via SUBCUTANEOUS
  Filled 2016-02-17 (×2): qty 0.4

## 2016-02-17 MED ORDER — METOCLOPRAMIDE HCL 10 MG PO TABS: 5.0000 mg | ORAL_TABLET | Freq: Three times a day (TID) | ORAL | Status: DC | PRN

## 2016-02-17 MED ORDER — SODIUM CHLORIDE 0.9 % IV SOLN
INTRAVENOUS | Status: DC
  Administered 2016-02-17: 22:00:00 via INTRAVENOUS

## 2016-02-17 MED ORDER — LACTATED RINGERS IV SOLN
INTRAVENOUS | Status: DC | PRN
  Administered 2016-02-17 (×2): via INTRAVENOUS

## 2016-02-17 MED ORDER — HYDROMORPHONE HCL 1 MG/ML IJ SOLN
0.2500 mg | INTRAMUSCULAR | Status: DC | PRN
  Administered 2016-02-17 (×3): 0.5 mg via INTRAVENOUS

## 2016-02-17 MED ORDER — SODIUM CHLORIDE 0.9 % IJ SOLN
INTRAMUSCULAR | Status: DC | PRN
  Administered 2016-02-17: 30 mL

## 2016-02-17 MED ORDER — ONDANSETRON HCL 4 MG PO TABS: 4.0000 mg | ORAL_TABLET | Freq: Four times a day (QID) | ORAL | Status: DC | PRN

## 2016-02-17 MED ORDER — PROPOFOL 10 MG/ML IV BOLUS
INTRAVENOUS | Status: AC
  Filled 2016-02-17: qty 20

## 2016-02-17 MED ORDER — HYDROCODONE-ACETAMINOPHEN 5-325 MG PO TABS
1.0000 | ORAL_TABLET | ORAL | Status: DC | PRN
  Administered 2016-02-18 (×2): 1 via ORAL

## 2016-02-17 MED ORDER — LIDOCAINE HCL (CARDIAC) 20 MG/ML IV SOLN
INTRAVENOUS | Status: AC
  Filled 2016-02-17: qty 5

## 2016-02-17 MED ORDER — ACETAMINOPHEN 325 MG PO TABS
650.0000 mg | ORAL_TABLET | Freq: Four times a day (QID) | ORAL | Status: DC | PRN
Start: 2016-02-17 — End: 2016-02-18

## 2016-02-17 MED ORDER — SODIUM CHLORIDE 0.9 % IV SOLN
INTRAVENOUS | Status: DC
  Administered 2016-02-17: 15:00:00 via INTRAVENOUS

## 2016-02-17 MED ORDER — ROCURONIUM BROMIDE 100 MG/10ML IV SOLN
INTRAVENOUS | Status: DC | PRN
  Administered 2016-02-17: 30 mg via INTRAVENOUS

## 2016-02-17 MED ORDER — PHENYLEPHRINE HCL 10 MG/ML IJ SOLN
INTRAMUSCULAR | Status: DC | PRN
  Administered 2016-02-17 (×9): 80 ug via INTRAVENOUS

## 2016-02-17 MED ORDER — MIDAZOLAM HCL 5 MG/5ML IJ SOLN
INTRAMUSCULAR | Status: DC | PRN
  Administered 2016-02-17: 2 mg via INTRAVENOUS

## 2016-02-17 MED ORDER — ROCURONIUM BROMIDE 100 MG/10ML IV SOLN
INTRAVENOUS | Status: AC
  Filled 2016-02-17: qty 1

## 2016-02-17 MED ORDER — HYDROMORPHONE HCL 1 MG/ML IJ SOLN
INTRAMUSCULAR | Status: AC
  Filled 2016-02-17: qty 1

## 2016-02-17 MED ORDER — FENTANYL CITRATE (PF) 100 MCG/2ML IJ SOLN
25.0000 ug | INTRAMUSCULAR | Status: DC | PRN
  Administered 2016-02-17 (×3): 50 ug via INTRAVENOUS

## 2016-02-17 MED ORDER — HYDROCODONE-ACETAMINOPHEN 5-325 MG PO TABS: 1.0000 | ORAL_TABLET | ORAL | Status: DC | PRN

## 2016-02-17 MED ORDER — BUPIVACAINE HCL (PF) 0.25 % IJ SOLN
INTRAMUSCULAR | Status: AC
  Filled 2016-02-17: qty 30

## 2016-02-17 MED ORDER — VANCOMYCIN HCL IN DEXTROSE 1-5 GM/200ML-% IV SOLN
1000.0000 mg | INTRAVENOUS | Status: AC
  Administered 2016-02-17: 1000 mg via INTRAVENOUS
  Filled 2016-02-17: qty 200

## 2016-02-17 MED ORDER — DEXAMETHASONE SODIUM PHOSPHATE 10 MG/ML IJ SOLN
INTRAMUSCULAR | Status: AC
  Filled 2016-02-17: qty 1

## 2016-02-17 MED ORDER — SUCCINYLCHOLINE CHLORIDE 20 MG/ML IJ SOLN
INTRAMUSCULAR | Status: DC | PRN
  Administered 2016-02-17: 100 mg via INTRAVENOUS

## 2016-02-17 MED ORDER — METHOCARBAMOL 1000 MG/10ML IJ SOLN
500.0000 mg | Freq: Three times a day (TID) | INTRAVENOUS | Status: DC
  Administered 2016-02-17: 500 mg via INTRAVENOUS
  Filled 2016-02-17 (×2): qty 5
  Filled 2016-02-17: qty 550
  Filled 2016-02-17 (×2): qty 5

## 2016-02-17 MED ORDER — METOCLOPRAMIDE HCL 5 MG/ML IJ SOLN
5.0000 mg | Freq: Three times a day (TID) | INTRAMUSCULAR | Status: DC | PRN
  Filled 2016-02-17: qty 2

## 2016-02-17 MED ORDER — METHOCARBAMOL 500 MG PO TABS
500.0000 mg | ORAL_TABLET | Freq: Four times a day (QID) | ORAL | Status: DC | PRN
  Administered 2016-02-18: 500 mg via ORAL
  Filled 2016-02-17: qty 1

## 2016-02-17 MED ORDER — ONDANSETRON HCL 4 MG/2ML IJ SOLN
INTRAMUSCULAR | Status: DC | PRN
  Administered 2016-02-17: 4 mg via INTRAVENOUS

## 2016-02-17 MED ORDER — MORPHINE SULFATE (PF) 2 MG/ML IV SOLN: 1.0000 mg | INTRAVENOUS | Status: DC | PRN

## 2016-02-17 MED ORDER — SUGAMMADEX SODIUM 200 MG/2ML IV SOLN
INTRAVENOUS | Status: DC | PRN
  Administered 2016-02-17: 100 mg via INTRAVENOUS

## 2016-02-17 MED ORDER — SODIUM CHLORIDE 0.9 % IJ SOLN
INTRAMUSCULAR | Status: AC
  Filled 2016-02-17: qty 50

## 2016-02-17 MED ORDER — FENTANYL CITRATE (PF) 100 MCG/2ML IJ SOLN
INTRAMUSCULAR | Status: DC | PRN
  Administered 2016-02-17: 50 ug via INTRAVENOUS

## 2016-02-17 MED ORDER — DEXAMETHASONE SODIUM PHOSPHATE 10 MG/ML IJ SOLN: 10.0000 mg | Freq: Once | INTRAMUSCULAR | Status: DC

## 2016-02-17 MED ORDER — CHLORHEXIDINE GLUCONATE 4 % EX LIQD: 60.0000 mL | Freq: Once | CUTANEOUS | Status: DC

## 2016-02-17 MED ORDER — MIDAZOLAM HCL 2 MG/2ML IJ SOLN
INTRAMUSCULAR | Status: AC
  Filled 2016-02-17: qty 2

## 2016-02-17 MED ORDER — HYDROCODONE-ACETAMINOPHEN 5-325 MG PO TABS
1.0000 | ORAL_TABLET | ORAL | Status: DC | PRN
  Filled 2016-02-17 (×2): qty 1

## 2016-02-17 MED ORDER — 0.9 % SODIUM CHLORIDE (POUR BTL) OPTIME
TOPICAL | Status: DC | PRN
  Administered 2016-02-17: 1000 mL

## 2016-02-17 MED ORDER — MEPERIDINE HCL 50 MG/ML IJ SOLN: 6.2500 mg | INTRAMUSCULAR | Status: DC | PRN

## 2016-02-17 MED ORDER — LIDOCAINE HCL (CARDIAC) 20 MG/ML IV SOLN
INTRAVENOUS | Status: DC | PRN
  Administered 2016-02-17: 100 mg via INTRAVENOUS

## 2016-02-17 MED ORDER — ONDANSETRON HCL 4 MG/2ML IJ SOLN
INTRAMUSCULAR | Status: AC
  Filled 2016-02-17: qty 2

## 2016-02-17 MED ORDER — ACETAMINOPHEN 10 MG/ML IV SOLN
INTRAVENOUS | Status: AC
  Filled 2016-02-17: qty 100

## 2016-02-17 MED ORDER — BUPIVACAINE HCL 0.25 % IJ SOLN
INTRAMUSCULAR | Status: DC | PRN
  Administered 2016-02-17: 20 mL

## 2016-02-17 MED ORDER — METHOCARBAMOL 500 MG PO TABS: 500.0000 mg | ORAL_TABLET | Freq: Four times a day (QID) | ORAL | Status: DC

## 2016-02-17 MED ORDER — TRAMADOL HCL 50 MG PO TABS: 50.0000 mg | ORAL_TABLET | Freq: Four times a day (QID) | ORAL | Status: DC | PRN

## 2016-02-17 MED ORDER — ACETAMINOPHEN 650 MG RE SUPP: 650.0000 mg | Freq: Four times a day (QID) | RECTAL | Status: DC | PRN

## 2016-02-17 SURGICAL SUPPLY — 47 items
ANCHOR SUPER QUICK (Anchor) ×3 IMPLANT
BAG ZIPLOCK 12X15 (MISCELLANEOUS) IMPLANT
BIT DRILL 2.4X128 (BIT) IMPLANT
BIT DRILL 2.4X128MM (BIT)
BLADE EXTENDED COATED 6.5IN (ELECTRODE) ×3 IMPLANT
CLOSURE WOUND 1/2 X4 (GAUZE/BANDAGES/DRESSINGS) ×1
DRAPE INCISE IOBAN 66X45 STRL (DRAPES) ×3 IMPLANT
DRAPE ORTHO SPLIT 77X108 STRL (DRAPES) ×4
DRAPE POUCH INSTRU U-SHP 10X18 (DRAPES) ×3 IMPLANT
DRAPE SURG ORHT 6 SPLT 77X108 (DRAPES) ×2 IMPLANT
DRAPE U-SHAPE 47X51 STRL (DRAPES) ×3 IMPLANT
DRSG ADAPTIC 3X8 NADH LF (GAUZE/BANDAGES/DRESSINGS) ×3 IMPLANT
DRSG MEPILEX BORDER 4X4 (GAUZE/BANDAGES/DRESSINGS) IMPLANT
DRSG MEPILEX BORDER 4X8 (GAUZE/BANDAGES/DRESSINGS) ×3 IMPLANT
DURAPREP 26ML APPLICATOR (WOUND CARE) ×3 IMPLANT
ELECT REM PT RETURN 9FT ADLT (ELECTROSURGICAL) ×3
ELECTRODE REM PT RTRN 9FT ADLT (ELECTROSURGICAL) ×1 IMPLANT
GAUZE SPONGE 4X4 12PLY STRL (GAUZE/BANDAGES/DRESSINGS) ×3 IMPLANT
GLOVE BIO SURGEON STRL SZ 6.5 (GLOVE) ×2 IMPLANT
GLOVE BIO SURGEON STRL SZ8 (GLOVE) ×3 IMPLANT
GLOVE BIO SURGEONS STRL SZ 6.5 (GLOVE) ×1
GLOVE BIOGEL PI IND STRL 7.5 (GLOVE) ×2 IMPLANT
GLOVE BIOGEL PI IND STRL 8 (GLOVE) ×1 IMPLANT
GLOVE BIOGEL PI INDICATOR 7.5 (GLOVE) ×4
GLOVE BIOGEL PI INDICATOR 8 (GLOVE) ×2
GLOVE INDICATOR 6.5 STRL GRN (GLOVE) ×3 IMPLANT
GLOVE SURG SS PI 7.5 STRL IVOR (GLOVE) ×3 IMPLANT
GOWN STRL REUS W/TWL LRG LVL3 (GOWN DISPOSABLE) ×3 IMPLANT
GOWN STRL REUS W/TWL XL LVL3 (GOWN DISPOSABLE) ×6 IMPLANT
KIT BASIN OR (CUSTOM PROCEDURE TRAY) ×3 IMPLANT
MANIFOLD NEPTUNE II (INSTRUMENTS) ×3 IMPLANT
NDL SAFETY ECLIPSE 18X1.5 (NEEDLE) ×2 IMPLANT
NEEDLE HYPO 18GX1.5 SHARP (NEEDLE) ×4
PACK TOTAL JOINT (CUSTOM PROCEDURE TRAY) ×3 IMPLANT
PASSER SUT SWANSON 36MM LOOP (INSTRUMENTS) IMPLANT
POSITIONER SURGICAL ARM (MISCELLANEOUS) ×3 IMPLANT
STRIP CLOSURE SKIN 1/2X4 (GAUZE/BANDAGES/DRESSINGS) ×2 IMPLANT
SUT ETHIBOND NAB CT1 #1 30IN (SUTURE) IMPLANT
SUT MNCRL AB 4-0 PS2 18 (SUTURE) ×6 IMPLANT
SUT VIC AB 1 CT1 36 (SUTURE) ×3 IMPLANT
SUT VIC AB 2-0 CT1 27 (SUTURE) ×4
SUT VIC AB 2-0 CT1 TAPERPNT 27 (SUTURE) ×2 IMPLANT
SUT VLOC 180 0 24IN GS25 (SUTURE) ×3 IMPLANT
SYR 20CC LL (SYRINGE) ×3 IMPLANT
SYR 50ML LL SCALE MARK (SYRINGE) ×3 IMPLANT
TAPE STRIPS DRAPE STRL (GAUZE/BANDAGES/DRESSINGS) ×3 IMPLANT
TOWEL OR 17X26 10 PK STRL BLUE (TOWEL DISPOSABLE) ×3 IMPLANT

## 2016-02-17 NOTE — H&P (Signed)
CC- Megan Taylor is a 70 y.o. female who presents with right hip pain  Hip Pain: Patient complains of right hip pain. Onset of the symptoms was several months ago. Inciting event: none. Current symptoms include lateral hip pain at all times. Associated symptoms: none. Aggravating symptoms: lateral movements, pivoting and laying on right side. Patient's pain has gotten progressively worse over time.. Patient has had no prior hip problems. Evaluation to date: MRI with gluteal tendon tear.  Treatment to date: physical therapy, which has been not very effective and cortisone injection with temporary benefit.  Past Medical History  Diagnosis Date  . Wolff-Parkinson-White (WPW) syndrome 1992    heart ablation  . Osteopenia     was on fosamax for awhile  . Tremor   . Melanoma ([name])   . Hip pain     "bursitis right hip"  . Dry eyes     uses Restasis eye drops.    Past Surgical History  Procedure Laterality Date  . Heart ablation  1992    [place]"Wolfe-Parkinson white syndrome"  . Tonsillectomy    . Breast biopsy  1975    benign lump  . Breast cyst aspiration      benign  . Melanoma excision Right 06/2009    right ankle  . Dilation and curettage of uterus  1977    with BTL    Prior to Admission medications   Medication Sig Start Date End Date Taking? Authorizing Provider  Ascorbic Acid Buffered (BUFFERED VITAMIN C) 1000 MG CAPS Take 1,000 mg by mouth daily.   Yes Historical Provider, MD  B Complex Vitamins (B COMPLEX PO) Take 1 tablet by mouth daily.   Yes Historical Provider, MD  calcium carbonate (OS-CAL) 600 MG tablet Take 600 mg by mouth daily.   Yes Historical Provider, MD  Cholecalciferol (VITAMIN D) 2000 UNITS CAPS Take 2,000 Units by mouth daily.    Yes Historical Provider, MD  Coenzyme Q10 (CO Q 10 PO) Take 1 tablet by mouth daily.    Yes Historical Provider, MD  cycloSPORINE (RESTASIS) 0.05 % ophthalmic emulsion Place 1 drop into both eyes 2 (two) times daily.   Yes  Historical Provider, MD  fish oil-omega-3 fatty acids 1000 MG capsule Take 1 g by mouth 2 (two) times daily.    Yes Historical Provider, MD  ibuprofen (ADVIL) 200 MG tablet Take 600 mg by mouth every 6 (six) hours as needed (For pain.).   Yes Historical Provider, MD  oseltamivir (TAMIFLU) 75 MG capsule Take 75 mg by mouth daily. She is to take for 10 days. She started on 01/28/16 and currently has 4 doses left to take. 01/28/16  Yes Historical Provider, MD  OVER THE COUNTER MEDICATION Take 1 capsule by mouth 2 (two) times daily. Joint Stimuflex   Yes Historical Provider, MD  Probiotic Product (PROBIOTIC DAILY PO) Take 1 capsule by mouth daily.    Yes Historical Provider, MD  traMADol (ULTRAM) 50 MG tablet Take 50-100 mg by mouth every 6 (six) hours as needed. For pain. 12/23/15  Yes Historical Provider, MD  meloxicam (MOBIC) 7.5 MG tablet Take 1 tablet (7.5 mg total) by mouth daily. Patient not taking: Reported on 02/03/2016 04/11/15   Megan Taylor, DPM    Physical Examination: General appearance - alert, well appearing, and in no distress Mental status - alert, oriented to person, place, and time Chest - clear to auscultation, no wheezes, rales or rhonchi, symmetric air entry Heart - normal rate, regular rhythm,  normal S1, S2, no murmurs, rubs, clicks or gallops Abdomen - soft, nontender, nondistended, no masses or organomegaly Neurological - alert, oriented, normal speech, no focal findings or movement disorder noted  A right hip exam was performed. GENERAL: no acute distress SKIN: intact SWELLING: none WARMTH: no warmth TENDERNESS: maximal at greater trochanter ROM: normal STRENGTH: normal GAIT: antalgic  ASSESSMENT: Right hip intractable bursitis with gluteal tendon tear  Plan Right hip bursectomy with gluteal tendon repair  Dione Plover. Konstantina Nachreiner, MD    02/17/2016, 3:59 PM

## 2016-02-17 NOTE — Anesthesia Preprocedure Evaluation (Addendum)
Anesthesia Evaluation  Patient identified by MRN, date of birth, ID band Patient awake    Reviewed: Allergy & Precautions, NPO status , Patient's Chart, lab work & pertinent test results  Airway Mallampati: I  TM Distance: >3 FB Neck ROM: Full    Dental   Pulmonary    Pulmonary exam normal        Cardiovascular Normal cardiovascular exam     Neuro/Psych    GI/Hepatic   Endo/Other    Renal/GU      Musculoskeletal   Abdominal   Peds  Hematology   Anesthesia Other Findings   Reproductive/Obstetrics                             Anesthesia Physical Anesthesia Plan  ASA: II  Anesthesia Plan: General   Post-op Pain Management:    Induction: Intravenous  Airway Management Planned: Oral ETT  Additional Equipment:   Intra-op Plan:   Post-operative Plan: Extubation in OR  Informed Consent: I have reviewed the patients History and Physical, chart, labs and discussed the procedure including the risks, benefits and alternatives for the proposed anesthesia with the patient or authorized representative who has indicated his/her understanding and acceptance.     Plan Discussed with: CRNA and Surgeon  Anesthesia Plan Comments:         Anesthesia Quick Evaluation  

## 2016-02-17 NOTE — Anesthesia Postprocedure Evaluation (Signed)
Anesthesia Post Note  Patient: Megan Taylor  Procedure(s) Performed: Procedure(s) (LRB): RIGHT HIP BURSECTOMY GLUTEAL TENDON REPAIR (Right)  Patient location during evaluation: PACU Anesthesia Type: General Level of consciousness: awake and alert Pain management: pain level controlled Vital Signs Assessment: post-procedure vital signs reviewed and stable Respiratory status: spontaneous breathing, nonlabored ventilation, respiratory function stable and patient connected to nasal cannula oxygen Cardiovascular status: blood pressure returned to baseline and stable Postop Assessment: no signs of nausea or vomiting Anesthetic complications: no    Last Vitals:  Filed Vitals:   02/17/16 1800 02/17/16 1815  BP: 112/72 115/72  Pulse: 71 65  Temp:    Resp: 10 11    Last Pain:  Filed Vitals:   02/17/16 1826  PainSc: 10-Worst pain ever                 Brier Reid S

## 2016-02-17 NOTE — Interval H&P Note (Signed)
History and Physical Interval Note:  02/17/2016 4:03 PM  Megan Taylor  has presented today for surgery, with the diagnosis of RIGHT HIP [place]  The various methods of treatment have been discussed with the patient and family. After consideration of risks, benefits and other options for treatment, the patient has consented to  Procedure(s): [place] (Right) as a surgical intervention .  The patient's history has been reviewed, patient examined, no change in status, stable for surgery.  I have reviewed the patient's chart and labs.  Questions were answered to the patient's satisfaction.     Gearlean Alf

## 2016-02-17 NOTE — Brief Op Note (Signed)
02/17/2016  4:53 PM  PATIENT:  Megan Taylor  70 y.o. female  PRE-OPERATIVE DIAGNOSIS:  RIGHT HIP TROCHANTERIC BURSITIS AND GLUTEAL TENDON TEAR  POST-OPERATIVE DIAGNOSIS:  RIGHT HIP TROCHANTERIC BURSITIS AND GLUTEAL TENDON TEAR  PROCEDURE:  Procedure(s): RIGHT HIP BURSECTOMY GLUTEAL TENDON REPAIR (Right)  SURGEON:  Surgeon(s) and Role:    * Gaynelle Arabian, MD - Primary  PHYSICIAN ASSISTANT:   ASSISTANTS: Willaim Bane, PA-student   ANESTHESIA:   general  EBL:     BLOOD ADMINISTERED:none  DRAINS: none   LOCAL MEDICATIONS USED:  OTHER [employer]  COUNTS:  YES  TOURNIQUET:  * No tourniquets in log *  DICTATION: .Other Dictation: Dictation Number [id number]  PLAN OF CARE: Discharge to home after PACU  PATIENT DISPOSITION:  PACU - hemodynamically stable.

## 2016-02-17 NOTE — Anesthesia Procedure Notes (Signed)
Procedure Name: Intubation Date/Time: 02/17/2016 4:05 PM Performed by: Lind Covert Pre-anesthesia Checklist: Patient identified, Timeout performed, Emergency Drugs available, Suction available and Patient being monitored Patient Re-evaluated:Patient Re-evaluated prior to inductionOxygen Delivery Method: Circle system utilized Preoxygenation: Pre-oxygenation with 100% oxygen Intubation Type: IV induction Laryngoscope Size: Mac and 3 Grade View: Grade I Tube type: Oral Tube size: 7.0 mm Number of attempts: 1 Airway Equipment and Method: Stylet Placement Confirmation: ETT inserted through vocal cords under direct vision and breath sounds checked- equal and bilateral Secured at: 21 cm Tube secured with: Tape Dental Injury: Teeth and Oropharynx as per pre-operative assessment

## 2016-02-17 NOTE — Transfer of Care (Signed)
Immediate Anesthesia Transfer of Care Note  Patient: Megan Taylor  Procedure(s) Performed: Procedure(s): RIGHT HIP BURSECTOMY GLUTEAL TENDON REPAIR (Right)  Patient Location: PACU  Anesthesia Type:General  Level of Consciousness: sedated  Airway & Oxygen Therapy: Patient Spontanous Breathing and Patient connected to face mask oxygen  Post-op Assessment: Report given to RN and Post -op Vital signs reviewed and stable  Post vital signs: Reviewed and stable  Last Vitals:  Filed Vitals:   02/17/16 1745 02/17/16 1800  BP: 126/74 112/72  Pulse: 84 71  Temp: 36.4 C   Resp: 13 10    Last Pain:  Filed Vitals:   02/17/16 1804  PainSc: 10-Worst pain ever      Patients Stated Pain Goal: 3 ([id number])  Complications: No apparent anesthesia complications

## 2016-02-18 ENCOUNTER — Encounter (HOSPITAL_COMMUNITY): Payer: Self-pay | Admitting: *Deleted

## 2016-02-18 DIAGNOSIS — M7061 Trochanteric bursitis, right hip: Secondary | ICD-10-CM | POA: Diagnosis not present

## 2016-02-18 MED ORDER — HYDROCODONE-ACETAMINOPHEN 5-325 MG PO TABS: 1.0000 | ORAL_TABLET | ORAL | Status: DC | PRN

## 2016-02-18 MED ORDER — METHOCARBAMOL 500 MG PO TABS: 500.0000 mg | ORAL_TABLET | Freq: Four times a day (QID) | ORAL | Status: DC | PRN

## 2016-02-18 MED ORDER — TRAMADOL HCL 50 MG PO TABS: 50.0000 mg | ORAL_TABLET | Freq: Four times a day (QID) | ORAL | Status: DC | PRN

## 2016-02-18 NOTE — Progress Notes (Signed)
Patient denies complaints of dizziness after BP taken. Patient states" she normally has a SBP in the 90-100's." Patient discharged.

## 2016-02-18 NOTE — Discharge Summary (Signed)
Physician Discharge Summary   Patient ID: Megan Taylor MRN: [id number] DOB/AGE: Jul 28, 1946 70 y.o.  Admit date: 02/17/2016 Discharge date: 02-18-2016  Primary Diagnosis:  RIGHT HIP [place] TEAR  Admission Diagnoses:  Past Medical History  Diagnosis Date  . Wolff-Parkinson-White (WPW) syndrome 1992    heart ablation  . Osteopenia     was on fosamax for awhile  . Tremor   . Melanoma ([place])   . Hip pain     "bursitis right hip"  . Dry eyes     uses Restasis eye drops.   Discharge Diagnoses:   Principal Problem:   Tendinopathy of right gluteus medius  Estimated body mass index is 18.83 kg/(m^2) as calculated from the following:   Height as of this encounter: 5' 3.5" (1.613 m).   Weight as of this encounter: 48.988 kg (108 lb).  Procedure(s) (LRB): RIGHT HIP BURSECTOMY GLUTEAL TENDON REPAIR (Right)   Consults: None  HPI: Patient complains of right hip pain. Onset of the symptoms was several months ago. Inciting event: none. Current symptoms include lateral hip pain at all times. Associated symptoms: none. Aggravating symptoms: lateral movements, pivoting and laying on right side. Patient's pain has gotten progressively worse over time.. Patient has had no prior hip problems. Evaluation to date: MRI with gluteal tendon tear. Treatment to date: physical therapy, which has been not very effective and cortisone injection with temporary benefit.  Laboratory Data: Admission on 02/17/2016  Component Date Value Ref Range Status  . WBC 02/17/2016 7.9  4.0 - 10.5 K/uL Final  . RBC 02/17/2016 4.47  3.87 - 5.11 MIL/uL Final  . Hemoglobin 02/17/2016 14.2  12.0 - 15.0 g/dL Final  . HCT 02/17/2016 41.5  36.0 - 46.0 % Final  . MCV 02/17/2016 92.8  78.0 - 100.0 fL Final  . MCH 02/17/2016 31.8  26.0 - 34.0 pg Final  . MCHC 02/17/2016 34.2  30.0 - 36.0 g/dL Final  . RDW 02/17/2016 12.6  11.5 - 15.5 % Final  . Platelets 02/17/2016 194  150 - 400 K/uL Final  .  Creatinine, Ser 02/17/2016 0.54  0.44 - 1.00 mg/dL Final  . GFR calc non Af Amer 02/17/2016 >60  >60 mL/min Final  . GFR calc Af Amer 02/17/2016 >60  >60 mL/min Final   Comment: (NOTE) The eGFR has been calculated using the CKD EPI equation. This calculation has not been validated in all clinical situations. eGFR's persistently <60 mL/min signify possible Chronic Kidney Disease.   Hospital Outpatient Visit on 02/12/2016  Component Date Value Ref Range Status  . WBC 02/12/2016 5.5  4.0 - 10.5 K/uL Final  . RBC 02/12/2016 4.55  3.87 - 5.11 MIL/uL Final  . Hemoglobin 02/12/2016 14.4  12.0 - 15.0 g/dL Final  . HCT 02/12/2016 42.2  36.0 - 46.0 % Final  . MCV 02/12/2016 92.7  78.0 - 100.0 fL Final  . MCH 02/12/2016 31.6  26.0 - 34.0 pg Final  . MCHC 02/12/2016 34.1  30.0 - 36.0 g/dL Final  . RDW 02/12/2016 12.6  11.5 - 15.5 % Final  . Platelets 02/12/2016 220  150 - 400 K/uL Final  . MRSA, PCR 02/12/2016 NEGATIVE  NEGATIVE Final  . Staphylococcus aureus 02/12/2016 NEGATIVE  NEGATIVE Final   Comment:        The Xpert SA Assay (FDA approved for NASAL specimens in patients over 75 years of age), is one component of a comprehensive surveillance program.  Test performance has been validated by  [facility] for patients greater than or equal to 67 year old. It is not intended to diagnose infection nor to guide or monitor treatment.      X-Rays:No results found.  EKG: Orders placed or performed during the hospital encounter of 02/12/16  . EKG 12 lead  . EKG 12 lead     Hospital Course: Patient was admitted to [facility] and taken to the OR and underwent the above state procedure without complications.  Patient tolerated the procedure well and was later transferred to the recovery room and then to the orthopaedic floor for postoperative care.  They were given PO and IV analgesics for pain control following their surgery.  They were given 24 hours of postoperative antibiotics  of  Anti-infectives    Start     Dose/Rate Route Frequency Ordered Stop   02/17/16 1415  vancomycin (VANCOCIN) IVPB 1000 mg/200 mL premix     1,000 mg 200 mL/hr over 60 Minutes Intravenous On call to O.R. 02/17/16 1413 02/17/16 1549     and started on DVT prophylaxis in the form of Lovenox. Patient was encouraged to get up and ambulate the day after surgery.  The patient was allowed to be WBAT with therapy. Discharge planning was consulted to help with postop disposition and equipment needs.  Patient had a tough night on the evening of surgery but better the next day.  They started to get up OOB with therapy on day one.   Patient was seen in rounds and was ready to go home following morning ambulation.  Diet - Regular diet Follow up - in two weeks. Call office for appointment at [phone]. Activity - Weight bearing as tolerated to the surgical leg. No active abduction of the leg, (no pulling leg out to the side away from the body). Walker for first several days until comfortable ambulating. May start showering three days following surgery but do not submerge incision under water. Continue to use ice for pain and swelling from surgery.  Baby Aspirin 81 mg daily for three weeks.  Please use walker for the first couple of days until comfortable ambulating.  May start changing dressing tomorrow with dry gauze and tape.  Disposition - Home Condition Upon Discharge - Good D/C Meds - See DC Summary DVT Prophylaxis - Aspirin 81 mg daily for three weeks.   Discharge Instructions    Call MD / Call 911    Complete by:  As directed   If you experience chest pain or shortness of breath, CALL 911 and be transported to the hospital emergency room.  If you develope a fever above 101 F, pus (white drainage) or increased drainage or redness at the wound, or calf pain, call your surgeon's office.     Call MD / Call 911    Complete by:  As directed   If you experience chest pain or shortness of breath,  CALL 911 and be transported to the hospital emergency room.  If you develope a fever above 101 F, pus (white drainage) or increased drainage or redness at the wound, or calf pain, call your surgeon's office.     Change dressing    Complete by:  As directed   You may change your dressing dressing daily with sterile 4 x 4 inch gauze dressing and paper tape.  Do not submerge the incision under water.     Constipation Prevention    Complete by:  As directed   Drink plenty of fluids.  Prune  juice may be helpful.  You may use a stool softener, such as Colace (over the counter) 100 mg twice a day.  Use MiraLax (over the counter) for constipation as needed.     Diet general    Complete by:  As directed      Discharge instructions    Complete by:  As directed   You may remove your large bandage and shower in 3 days  Take an 81 mg Aspirin daily for the next 4 weeks starting tomorrow  You may bear full weight on your right leg. Use a walker for balance until you are comfortable without it     Discharge instructions    Complete by:  As directed   Pick up stool softner and laxative for home use following surgery while on pain medications. Do not submerge incision under water. Please use good hand washing techniques while changing dressing each day. May shower starting three days after surgery. Please use a clean towel to pat the incision dry following showers. Continue to use ice for pain and swelling after surgery. Do not use any lotions or creams on the incision until instructed by your surgeon.  Take a Baby 81 mg Aspirin daily for three weeks.  Postoperative Constipation Protocol  Constipation - defined medically as fewer than three stools per week and severe constipation as less than one stool per week.  One of the most common issues patients have following surgery is constipation.  Even if you have a regular bowel pattern at home, your normal regimen is likely to be disrupted due to multiple  reasons following surgery.  Combination of anesthesia, postoperative narcotics, change in appetite and fluid intake all can affect your bowels.  In order to avoid complications following surgery, here are some recommendations in order to help you during your recovery period.  Colace (docusate) - Pick up an over-the-counter form of Colace or another stool softener and take twice a day as long as you are requiring postoperative pain medications.  Take with a full glass of water daily.  If you experience loose stools or diarrhea, hold the colace until you stool forms back up.  If your symptoms do not get better within 1 week or if they get worse, check with your doctor.  Dulcolax (bisacodyl) - Pick up over-the-counter and take as directed by the product packaging as needed to assist with the movement of your bowels.  Take with a full glass of water.  Use this product as needed if not relieved by Colace only.   MiraLax (polyethylene glycol) - Pick up over-the-counter to have on hand.  MiraLax is a solution that will increase the amount of water in your bowels to assist with bowel movements.  Take as directed and can mix with a glass of water, juice, soda, coffee, or tea.  Take if you go more than two days without a movement. Do not use MiraLax more than once per day. Call your doctor if you are still constipated or irregular after using this medication for 7 days in a row.  If you continue to have problems with postoperative constipation, please contact the office for further assistance and recommendations.  If you experience "the worst abdominal pain ever" or develop nausea or vomiting, please contact the office immediatly for further recommendations for treatment.     Do not sit on low chairs, stoools or toilet seats, as it may be difficult to get up from low surfaces    Complete by:  As directed      Driving restrictions    Complete by:  As directed   No driving until released by the physician.      Increase activity slowly as tolerated    Complete by:  As directed      Increase activity slowly as tolerated    Complete by:  As directed      Lifting restrictions    Complete by:  As directed   No lifting until released by the physician.     Patient may shower    Complete by:  As directed   You may shower without a dressing once there is no drainage.  Do not wash over the wound.  If drainage remains, do not shower until drainage stops.     TED hose    Complete by:  As directed   Use stockings (TED hose) for 3 weeks on both leg(s).  You may remove them at night for sleeping.     Walker standard    Complete by:  As directed      Weight bearing as tolerated    Complete by:  As directed   Laterality:  right  Extremity:  Lower            Medication List    STOP taking these medications        meloxicam 7.5 MG tablet  Commonly known as:  MOBIC     oseltamivir 75 MG capsule  Commonly known as:  TAMIFLU      TAKE these medications        ADVIL 200 MG tablet  Generic drug:  ibuprofen  Take 600 mg by mouth every 6 (six) hours as needed (For pain.).     B COMPLEX PO  Take 1 tablet by mouth daily.     BUFFERED VITAMIN C 1000 MG Caps  Take 1,000 mg by mouth daily.     calcium carbonate 600 MG tablet  Commonly known as:  OS-CAL  Take 600 mg by mouth daily.     CO Q 10 PO  Take 1 tablet by mouth daily.     cycloSPORINE 0.05 % ophthalmic emulsion  Commonly known as:  RESTASIS  Place 1 drop into both eyes 2 (two) times daily.     fish oil-omega-3 fatty acids 1000 MG capsule  Take 1 g by mouth 2 (two) times daily.     HYDROcodone-acetaminophen 5-325 MG tablet  Commonly known as:  NORCO  Take 1-2 tablets by mouth every 4 (four) hours as needed for moderate pain.     HYDROcodone-acetaminophen 5-325 MG tablet  Commonly known as:  NORCO/VICODIN  Take 1-2 tablets by mouth every 4 (four) hours as needed for moderate pain.     methocarbamol 500 MG tablet  Commonly known  as:  ROBAXIN  Take 1 tablet (500 mg total) by mouth 4 (four) times daily. As needed for muscle spasm     methocarbamol 500 MG tablet  Commonly known as:  ROBAXIN  Take 1 tablet (500 mg total) by mouth every 6 (six) hours as needed for muscle spasms.     OVER THE COUNTER MEDICATION  Take 1 capsule by mouth 2 (two) times daily. Joint Stimuflex     PROBIOTIC DAILY PO  Take 1 capsule by mouth daily.     traMADol 50 MG tablet  Commonly known as:  ULTRAM  Take 1-2 tablets (50-100 mg total) by mouth every 6 (six) hours as needed (mild pain). For pain.  Vitamin D 2000 units Caps  Take 2,000 Units by mouth daily.           Follow-up Information    Follow up with Gearlean Alf, MD. Schedule an appointment as soon as possible for a visit on 03/03/2016.   Specialty:  Orthopedic Surgery   Why:  Call [phone] tomorrow to make the appointment   Contact information:   [address] [place] [address] [phone]       Signed: Arlee Muslim, PA-C Orthopaedic Surgery 02/18/2016, 8:00 AM

## 2016-02-18 NOTE — Progress Notes (Signed)
[facility]    [facility] is providing the following services: RW  If patient discharges after hours, please call [phone].   Linward Headland 02/18/2016, 10:30 AM

## 2016-02-18 NOTE — Evaluation (Signed)
Physical Therapy Evaluation Patient Details Name: Megan Taylor MRN: [id number] DOB: [birth year] Today's Date: 02/18/2016   History of Present Illness  70 yo female s/p R hip bursectomy, gluteal tendon repair 02/17/16.   Clinical Impression  On eval, pt required Min guard assist for mobility-walked ~60 feet with RW before c/o dizziness. BP 89/47-made RN aware. Will return later today.     Follow Up Recommendations No PT follow up;Supervision - Intermittent    Equipment Recommendations  Rolling walker with 5" wheels    Recommendations for Other Services       Precautions / Restrictions Precautions Precautions: Other (comment) Precaution Comments: No R hip active abduction Restrictions Weight Bearing Restrictions: No      Mobility  Bed Mobility Overal bed mobility: Needs Assistance Bed Mobility: Supine to Sit     Supine to sit: Supervision     General bed mobility comments: for safety  Transfers Overall transfer level: Needs assistance Equipment used: Rolling walker (2 wheeled) Transfers: Sit to/from Stand Sit to Stand: Supervision         General transfer comment: for safety. VCs hand placement  Ambulation/Gait Ambulation/Gait assistance: Min guard Ambulation Distance (Feet): 60 Feet Assistive device: Rolling walker (2 wheeled) Gait Pattern/deviations: Step-to pattern;Step-through pattern;Decreased stride length;Antalgic     General Gait Details: slow gait speed. Pt c/o dizziness-seated in wheelchair. BP 89/47. Deferred further ambulation.Made RN aware.   Stairs            Wheelchair Mobility    Modified Rankin (Stroke Patients Only)       Balance                                             Pertinent Vitals/Pain Pain Assessment: 0-10 Pain Score: 7  Pain Location: R hip Pain Descriptors / Indicators: Aching;Sore Pain Intervention(s): Monitored during session;Ice applied;Repositioned    Home Living Family/patient expects  to be discharged to:: Private residence Living Arrangements: Spouse/significant other   Type of Home: House Home Access: Stairs to enter Entrance Stairs-Rails: None Technical brewer of Steps: 2 Home Layout: One level Home Equipment: None      Prior Function Level of Independence: Independent               Hand Dominance        Extremity/Trunk Assessment   Upper Extremity Assessment: Overall WFL for tasks assessed           Lower Extremity Assessment: RLE deficits/detail RLE Deficits / Details: WFL-did not MMT    Cervical / Trunk Assessment: Normal  Communication   Communication: No difficulties  Cognition Arousal/Alertness: Awake/alert Behavior During Therapy: WFL for tasks assessed/performed Overall Cognitive Status: Within Functional Limits for tasks assessed                      General Comments      Exercises        Assessment/Plan    PT Assessment Patient needs continued PT services  PT Diagnosis Difficulty walking;Acute pain   PT Problem List Decreased mobility;Pain;Decreased range of motion;Decreased knowledge of precautions  PT Treatment Interventions DME instruction;Gait training;Functional mobility training;Therapeutic activities;Patient/family education;Therapeutic exercise   PT Goals (Current goals can be found in the Care Plan section) Acute Rehab PT Goals Patient Stated Goal: home today. PT Goal Formulation: With patient/family Time For Goal Achievement: 02/25/16 Potential to Achieve Goals:  Good    Frequency Min 5X/week   Barriers to discharge        Co-evaluation               End of Session   Activity Tolerance: Other (comment) (Limited by drop in BP) Patient left: in chair;with family/visitor present      Functional Assessment Tool Used: clinical judgement Functional Limitation: Mobility: Walking and moving around Mobility: Walking and Moving Around Current Status [id number]): At least 1 percent but  less than 20 percent impaired, limited or restricted Mobility: Walking and Moving Around Goal Status [phone]): At least 1 percent but less than 20 percent impaired, limited or restricted    Time: 0943-1000 PT Time Calculation (min) (ACUTE ONLY): 17 min   Charges:   PT Evaluation $PT Eval Low Complexity: 1 Procedure     PT G Codes:   PT G-Codes **NOT FOR INPATIENT CLASS** Functional Assessment Tool Used: clinical judgement Functional Limitation: Mobility: Walking and moving around Mobility: Walking and Moving Around Current Status [id number]): At least 1 percent but less than 20 percent impaired, limited or restricted Mobility: Walking and Moving Around Goal Status [phone]): At least 1 percent but less than 20 percent impaired, limited or restricted    [name], MPT Pager: [phone]

## 2016-02-18 NOTE — Progress Notes (Signed)
   Subjective: 1 Day Post-Op Procedure(s) (LRB): RIGHT HIP BURSECTOMY GLUTEAL TENDON REPAIR (Right) Patient reports pain as moderate.   Patient seen in rounds with Dr. Wynelle Link. Family in room. Briefly discussed the surgical findings.  Will allow home today. Patient is well, but has had some minor complaints of pain in the hip, requiring pain medications Patient is ready to go home today.  Objective: Vital signs in last 24 hours: Temp:  [97.4 F (36.3 C)-98.4 F (36.9 C)] 97.5 F (36.4 C) (05/09 0602) Pulse Rate:  [58-84] 72 (05/09 0602) Resp:  [6-16] 15 (05/09 0602) BP: (95-156)/(50-83) 104/56 mmHg (05/09 0602) SpO2:  [98 %-100 %] 100 % (05/09 0602) Weight:  [48.988 kg (108 lb)] 48.988 kg (108 lb) (05/08 1445)  Intake/Output from previous day:  Intake/Output Summary (Last 24 hours) at 02/18/16 0753 Last data filed at 02/18/16 0602  Gross per 24 hour  Intake   2600 ml  Output    570 ml  Net   2030 ml    EXAM: General - Patient is Alert, Appropriate and Oriented Extremity - Neurovascular intact Sensation intact distally Dorsiflexion/Plantar flexion intact Dressing - clean, dry, no drainage Motor Function - intact, moving foot and toes well on exam.   Assessment/Plan: 1 Day Post-Op Procedure(s) (LRB): RIGHT HIP BURSECTOMY GLUTEAL TENDON REPAIR (Right) Procedure(s) (LRB): RIGHT HIP BURSECTOMY GLUTEAL TENDON REPAIR (Right) Past Medical History  Diagnosis Date  . Wolff-Parkinson-White (WPW) syndrome 1992    heart ablation  . Osteopenia     was on fosamax for awhile  . Tremor   . Melanoma ([place])   . Hip pain     "bursitis right hip"  . Dry eyes     uses Restasis eye drops.   Principal Problem:   Tendinopathy of right gluteus medius  Estimated body mass index is 18.83 kg/(m^2) as calculated from the following:   Height as of this encounter: 5' 3.5" (1.613 m).   Weight as of this encounter: 48.988 kg (108 lb). Up with therapy Diet - Regular diet Follow up - in  two weeks. Call office for appointment at [phone]. Activity - Weight bearing as tolerated to the surgical leg.  No active abduction of the leg, (no pulling leg out to the side away from the body).  Walker for first several days until comfortable ambulating. May start showering three days following surgery but do not submerge incision under water. Continue to use ice for pain and swelling from surgery.  Baby Aspirin 81 mg daily for three weeks.  Please use walker for the first couple of days until comfortable ambulating.  May start changing dressing tomorrow with dry gauze and tape.  Disposition - Home Condition Upon Discharge - Good D/C Meds - See DC Summary DVT Prophylaxis - Aspirin 81 mg daily for three weeks.  Arlee Muslim, PA-C Orthopaedic Surgery 02/18/2016, 7:53 AM

## 2016-02-18 NOTE — Care Management Note (Signed)
Case Management Note  Patient Details  Name: Megan Taylor MRN: [id number] Date of Birth: 1946/01/31  Subjective/Objective:    S/p RIGHT HIP BURSECTOMY GLUTEAL TENDON REPAIR                 Action/Plan: Discharge planning, no PT needs at d/c. Needs RW, contacted [facility] to deliver to room  Expected Discharge Date:                  Expected Discharge Plan:  Home/Self Care  In-House Referral:  NA  Discharge planning Services  CM Consult  Post Acute Care Choice:  NA Choice offered to:  NA  DME Arranged:  N/A DME Agency:  NA  HH Arranged:  NA HH Agency:  NA  Status of Service:  Completed, signed off  Medicare Important Message Given:    Date Medicare IM Given:    Medicare IM give by:    Date Additional Medicare IM Given:    Additional Medicare Important Message give by:     If discussed at [place] of Stay Meetings, dates discussed:    Additional Comments:  Guadalupe Maple, RN 02/18/2016, 11:51 AM [phone]

## 2016-02-18 NOTE — Progress Notes (Signed)
Physical Therapy Treatment Patient Details Name: Megan Taylor MRN: [id number] DOB: Jan 07, 1946 Today's Date: 02/18/2016    History of Present Illness 70 yo female s/p R hip bursectomy, gluteal tendon repair 02/17/16.     PT Comments    Improved this session. BP 122/59 at end of session. Pt tolerated ambulation and stair training. All education completed-reviewed no hip abduction for bed mobility and car transfer. Ready to d/c from PT standpoint.   Follow Up Recommendations  No PT follow up;Supervision - Intermittent     Equipment Recommendations  Rolling walker with 5" wheels    Recommendations for Other Services       Precautions / Restrictions Precautions Precautions: Other (comment) Precaution Comments: No R hip active abduction Restrictions Weight Bearing Restrictions: No    Mobility  Bed Mobility Overal bed mobility: Needs Assistance Bed Mobility: Supine to Sit     Supine to sit: Supervision     General bed mobility comments: oob in recliner  Transfers Overall transfer level: Needs assistance Equipment used: Rolling walker (2 wheeled) Transfers: Sit to/from Stand Sit to Stand: Supervision         General transfer comment: for safety. VCs hand placement  Ambulation/Gait Ambulation/Gait assistance: Supervision Ambulation Distance (Feet): 150 Feet Assistive device: Rolling walker (2 wheeled) Gait Pattern/deviations: Step-to pattern;Step-through pattern;Decreased stride length;Antalgic     General Gait Details: slow gait speed. No c/o dizziness. slow gait speed.    Stairs Stairs: Yes Stairs assistance: Min assist Stair Management: Step to pattern;Forwards;Two rails Number of Stairs: 2 General stair comments: small amount of assist to steady. VCs safety, sequence, technique.   Wheelchair Mobility    Modified Rankin (Stroke Patients Only)       Balance                                    Cognition Arousal/Alertness:  Awake/alert Behavior During Therapy: WFL for tasks assessed/performed Overall Cognitive Status: Within Functional Limits for tasks assessed                      Exercises      General Comments        Pertinent Vitals/Pain Pain Assessment: 0-10 Pain Score: 7  Pain Location: R hip Pain Descriptors / Indicators: Aching;Sore Pain Intervention(s): Monitored during session;Ice applied;Repositioned    Home Living Family/patient expects to be discharged to:: Private residence Living Arrangements: Spouse/significant other   Type of Home: House Home Access: Stairs to enter Entrance Stairs-Rails: None Home Layout: One level Home Equipment: None      Prior Function Level of Independence: Independent          PT Goals (current goals can now be found in the care plan section) Acute Rehab PT Goals Patient Stated Goal: home today. PT Goal Formulation: With patient/family Time For Goal Achievement: 02/25/16 Potential to Achieve Goals: Good Progress towards PT goals: Progressing toward goals    Frequency  Min 5X/week    PT Plan Current plan remains appropriate    Co-evaluation             End of Session Equipment Utilized During Treatment: Gait belt Activity Tolerance: Patient tolerated treatment well Patient left: in chair;with call bell/phone within reach;with family/visitor present     Time: 0943-1000 PT Time Calculation (min) (ACUTE ONLY): 17 min  Charges:  $Gait Training: 8-22 mins  G Codes:  Functional Assessment Tool Used: clinical judgement Functional Limitation: Mobility: Walking and moving around Mobility: Walking and Moving Around Goal Status [phone]): At least 1 percent but less than 20 percent impaired, limited or restricted Mobility: Walking and Moving Around Discharge Status [phone]): At least 1 percent but less than 20 percent impaired, limited or restricted   [name] [facility] 02/18/2016, 12:32 PM

## 2016-02-18 NOTE — Op Note (Signed)
Megan Taylor, GARNO NO.:  [id number]  MEDICAL RECORD NO.:  [id number]  LOCATION:  [id number]                         FACILITY:  [facility]  PHYSICIAN:  Gaynelle Arabian, M.D.    DATE OF BIRTH:  [birth year]  DATE OF PROCEDURE:  02/17/2016 DATE OF DISCHARGE:                              OPERATIVE REPORT   PREOPERATIVE DIAGNOSIS:  Right hip intractable trochanteric bursitis with gluteal tendon tear.  POSTOPERATIVE DIAGNOSIS:  Right hip intractable trochanteric bursitis with gluteal tendon tear.  PROCEDURE:  Right hip bursectomy with gluteal tendon repair.  SURGEON:  Gaynelle Arabian, M.D.  ASSISTANT:  __________  ANESTHESIA:  General.  ESTIMATED BLOOD LOSS:  Minimal.  DRAINS:  None.  COMPLICATIONS:  None.  CONDITION:  Stable to recovery.  BRIEF CLINICAL NOTE:  [name] is a 70 year old female with a chronic right lateral hip pain.  She has had physical therapy, injections of trochanteric bursa as well as intra-articular without significant long- lasting improvement.  MRI shows questionable gluteal tendon tear.  She presents now for bursectomy and tendon repair.  PROCEDURE IN DETAIL:  After successful administration of general anesthetic, the patient was placed in the left lateral decubitus position with the right side up and held with the hip positioner.  Right lower extremity was isolated from her perineum with plastic drapes and prepped and draped in the usual sterile fashion.  Short lateral incision was made centered over the greater trochanter.  Skin cut with a 10 blade through subcutaneous tissue to the fascia lata, which was incised in line with the skin incision.  There was a large thickened fluid-filled bursa underneath the fascia lata.  This was excised and meticulous hemostasis was achieved.  There was a large bursa and was inflamed and fluid filled.  After removal of the bursa, I inspected gluteal tendons. It was not a full-thickness tear, but on  gluteus medius centrally there is an area where the tendon felt very thin.  I detached that area, which was about 1 cm in length and there was degeneration of tendon and debrided back to normal-appearing tendon.  Mitek anchors placed in the greater trochanter and the sutures were passed through the free edges of the tendon and it was advanced down on to bone.  The sutures were tied and then the tendon was oversewn for very stable repair.  I again inspected.  No other bursal tissues present.  No other tears noted. When I had the tendon exposed, I was able to palpate the gluteus medius, which felt normal and appeared normal.  After repair then thoroughly irrigated with saline solution injected the gluteal muscles and tendons, the fascia lata and subcu tissues, a total of 20 mL of Exparel mixed with 30 mL of saline and then an additional 20 mL of 0.25% Marcaine was injected into the same tissues.  The fascia lata was closed with a running V-lock from the tip of the trochanter superiorly.  Triangle of tissue was removed overlying the tip of the trochanter and going about a cm distal to prevent rubbing against the trochanter formation of the bursa.  Distally, this tissue was closed with a #1 Vicryl.  Subcu was  then closed with interrupted 2-0 Vicryl subcuticular running 4-0 Monocryl.  Incision was cleaned and dried.  Steri-Strips and a bulky sterile dressing applied.  She was then awakened and transported to recovery in stable condition.     Gaynelle Arabian, M.D.     FA/MEDQ  D:  02/17/2016  T:  02/18/2016  Job:  [id number]

## 2016-02-18 NOTE — Discharge Instructions (Signed)
Dr. Gaynelle Arabian [facility] [address], [place], [place] [address] [phone]  Bursectomy / Gluteal Tendon Repair Discharge Instructions  HOME CARE INSTRUCTIONS  Remove items at home which could result in a fall. This includes throw rugs or furniture in walking pathways.   ICE to the affected hip every three hours for 30 minutes at a time and then as needed for pain and swelling.  Continue to use ice on the hip for pain and swelling from surgery. You may notice swelling that will progress down to the foot and ankle.  This is normal after surgery.  Elevate the leg when you are not up walking on it.    Continue to use the breathing machine which will help keep your temperature down.  It is common for your temperature to cycle up and down following surgery, especially at night when you are not up moving around and exerting yourself.  The breathing machine keeps your lungs expanded and your temperature down. Sit on high chairs which makes it easier to stand.  Sit on chairs with arms. Use the chair arms to help push yourself up when arising.   No Active Abduction of the leg (No pulling leg out to the side away from the body).  Use your walker for first several days until comfortable ambulating.  DIET You may resume your previous home diet once your are discharged from the hospital.  DRESSING / WOUND CARE / SHOWERING You may start showering three days following surgery but do not submerge the incision under water.Just pat the incision dry and apply a dry gauze dressing on daily. Change the surgical dressing daily and reapply a dry dressing each time.  ACTIVITY Walk with your walker as instructed. Use walker as long as suggested by your caregivers. Avoid periods of inactivity such as sitting longer than an hour when not asleep. This helps prevent blood clots.  You may resume a sexual relationship in one month or when given the OK by  your doctor.  You may return to work once you are cleared by your doctor.  Do not drive a car for 6 weeks or until released by you surgeon.  Do not drive while taking narcotics.  WEIGHT BEARING AS TOLERATED  POSTOPERATIVE CONSTIPATION PROTOCOL Constipation - defined medically as fewer than three stools per week and severe constipation as less than one stool per week.  One of the most common issues patients have following surgery is constipation.  Even if you have a regular bowel pattern at home, your normal regimen is likely to be disrupted due to multiple reasons following surgery.  Combination of anesthesia, postoperative narcotics, change in appetite and fluid intake all can affect your bowels.  In order to avoid complications following surgery, here are some recommendations in order to help you during your recovery period.  Colace (docusate) - Pick up an over-the-counter form of Colace or another stool softener and take twice a day as long as you are requiring postoperative pain medications.  Take with a full glass of water daily.  If you experience loose stools or diarrhea, hold the colace until you stool forms back up.  If your symptoms do not get better within 1 week or if they get worse, check with your doctor.  Dulcolax (bisacodyl) - Pick up over-the-counter and take as directed by the product packaging as needed to assist with the movement of your bowels.  Take with a full glass of water.  Use this product as needed if not relieved by Colace only.   MiraLax (polyethylene glycol) - Pick up over-the-counter to have on hand.  MiraLax is a solution that will increase the amount of water in your bowels to assist with bowel movements.  Take as directed and can mix with a glass of water, juice, soda, coffee, or tea.  Take if you go more than two days without a movement. Do not use MiraLax more than once per day. Call your doctor if you are still constipated or irregular after using this  medication for 7 days in a row.  If you continue to have problems with postoperative constipation, please contact the office for further assistance and recommendations.  If you experience "the worst abdominal pain ever" or develop nausea or vomiting, please contact the office immediatly for further recommendations for treatment.  ITCHING  If you experience itching with your medications, try taking only a single pain pill, or even half a pain pill at a time.  You can also use Benadryl over the counter for itching or also to help with sleep.   TED HOSE STOCKINGS Wear the elastic stockings on both legs for three weeks following surgery during the day but you may remove then at night for sleeping.  MEDICATIONS See your medication summary on the After Visit Summary that the nursing staff will review with you prior to discharge.  You may have some home medications which will be placed on hold until you complete the course of blood thinner medication.  It is important for you to complete the blood thinner medication as prescribed by your surgeon.  Continue your approved medications as instructed at time of discharge.  PRECAUTIONS If you experience chest pain or shortness of breath - call 911 immediately for transfer to the hospital emergency department.  If you develop a fever greater that 101 F, purulent drainage from wound, increased redness or drainage from wound, foul odor from the wound/dressing, or calf pain - CONTACT YOUR SURGEON.                                                   FOLLOW-UP APPOINTMENTS Make sure you keep all of your appointments after your operation with your surgeon and caregivers. You should call the office at the above phone number and make an appointment for approximately two weeks after the date of your surgery or on the date instructed by your surgeon outlined in the "After Visit Summary".   Pick up stool softner and laxative for home use following surgery while on pain  medications. Do not submerge incision under water. Please use good hand washing techniques while changing dressing each day. May shower starting three days after surgery. Please use a clean towel to pat the incision dry following showers. Continue to use ice for pain and swelling after surgery. Do not use any lotions or creams on the incision until instructed by your surgeon.  Take a Baby 81 mg Aspirin daily for three weeks.       General Anesthesia, Adult, Care After Refer to this sheet in the next few weeks. These instructions provide you with information on caring for yourself after your procedure. Your health care provider may also give you more specific instructions. Your treatment has been planned according to current medical practices, but problems sometimes occur. Call your  health care provider if you have any problems or questions after your procedure. WHAT TO EXPECT AFTER THE PROCEDURE After the procedure, it is typical to experience:  Sleepiness.  Nausea and vomiting. HOME CARE INSTRUCTIONS  For the first 24 hours after general anesthesia:  Have a responsible person with you.  Do not drive a car. If you are alone, do not take public transportation.  Do not drink alcohol.  Do not take medicine that has not been prescribed by your health care provider.  Do not sign important papers or make important decisions.  You may resume a normal diet and activities as directed by your health care provider.  Change bandages (dressings) as directed.  If you have questions or problems that seem related to general anesthesia, call the hospital and ask for the anesthetist or anesthesiologist on call. SEEK MEDICAL CARE IF:  You have nausea and vomiting that continue the day after anesthesia.  You develop a rash. SEEK IMMEDIATE MEDICAL CARE IF:   You have difficulty breathing.  You have chest pain.  You have any allergic problems.   This information is not intended to  replace advice given to you by your health care provider. Make sure you discuss any questions you have with your health care provider.   Document Released: 01/04/2001 Document Revised: 10/19/2014 Document Reviewed: 01/27/2012 Elsevier Interactive Patient Education [employer].

## 2016-04-27 ENCOUNTER — Other Ambulatory Visit: Payer: Self-pay | Admitting: Orthopedic Surgery

## 2016-04-27 DIAGNOSIS — M5440 Lumbago with sciatica, unspecified side: Secondary | ICD-10-CM

## 2016-04-29 ENCOUNTER — Ambulatory Visit
Admission: RE | Admit: 2016-04-29 | Discharge: 2016-04-29 | Disposition: A | Discharge status: Home / Self Care | Payer: Medicare Other | Source: Ambulatory Visit | Attending: Orthopedic Surgery | Admitting: Orthopedic Surgery

## 2016-04-29 DIAGNOSIS — M5440 Lumbago with sciatica, unspecified side: Secondary | ICD-10-CM

## 2016-05-11 ENCOUNTER — Ambulatory Visit (INDEPENDENT_AMBULATORY_CARE_PROVIDER_SITE_OTHER): Payer: Medicare Other | Admitting: Nurse Practitioner

## 2016-05-11 ENCOUNTER — Telehealth: Payer: Self-pay | Admitting: Nurse Practitioner

## 2016-05-11 ENCOUNTER — Encounter: Payer: Self-pay | Admitting: Nurse Practitioner

## 2016-05-11 VITALS — BP 120/62 | HR 88 | Resp 16 | Ht 63.5 in | Wt 109.2 lb

## 2016-05-11 DIAGNOSIS — L309 Dermatitis, unspecified: Secondary | ICD-10-CM

## 2016-05-11 DIAGNOSIS — N7689 Other specified inflammation of vagina and vulva: Secondary | ICD-10-CM

## 2016-05-11 NOTE — Patient Instructions (Signed)
We will call when results come back.  OTC Neosporin prn.

## 2016-05-11 NOTE — Telephone Encounter (Signed)
Spoke with patient. Patient reports she has a scab on her clitoris that she first noticed yesterday. Denies any pain or swelling to the area. Reports this has happened before, but will go away. Denies any recent activity that could irritate the area. "I just don't know what it could be and want to get in before it goes away again." Appointment scheduled for today 05/11/2016 at 12:45 pm with Kem Boroughs, FNP. She is agreeable to date and time.  Routing to provider for final review. Patient agreeable to disposition. Will close encounter.

## 2016-05-11 NOTE — Progress Notes (Signed)
70 y.o. Married Caucasian female G2P2 here with complaint of vaginal symptoms of a 'crusty' spot above the clitoris.  Describes discharge as none.  Last SA 1 week ago without any problems. Onset of symptoms yesterday. Denies new personal products but does use lubrication for SA secondary to vaginal dryness.   No STD concerns. Urinary symptoms none . Contraception is postmenopausal.  Denies any URI symptoms herself or husband.  There was no tenderness and no lesions.  She has not applied any medication or treatment.  She does walk almost daily and at times will wear a pad.  She thinks the sports wear is causing the irritation.  She has no history of HSV.   O:  Healthy female WDWN Affect: normal, orientation x 3  Exam: no distress Abdomen: soft and non tender Lymph node: no enlargement or tenderness Pelvic exam: External genital: normal female with a dry crusty place X 3 above the clitoris.  HSV culture is done but it is not classic for this at all. BUS: negative except for urethral caruncle and vaginal atrophy        A: Dry scaly area that could be from irritation  Could be clothing that caused the dryness  R/O HSV - not classic  Urethral caruncle   P.  Avoid moist clothes or pads for extended period of time. If working out in gym clothes or swim suits for long periods of time change underwear or bottoms of swimsuit if possible. Olive Oil/Coconut Oil use for skin protection prior to activity can be used to external skin.  Rx: may try OTC Neosporin to aid in healing or just coconut oil prn  Follow with HSV culture  She will make apt for AEX which is past due.  RV prn

## 2016-05-11 NOTE — Progress Notes (Signed)
Reviewed personally.  M. Suzanne Nashonda Limberg, MD.  

## 2016-05-11 NOTE — Telephone Encounter (Signed)
Patient has a place in her vaginal area and wants to be seen right away. She states it is like a scab.

## 2016-05-13 ENCOUNTER — Telehealth: Payer: Self-pay | Admitting: Nurse Practitioner

## 2016-05-13 LAB — HERPES SIMPLEX VIRUS CULTURE: ORGANISM ID, BACTERIA: NOT DETECTED

## 2016-05-13 NOTE — Telephone Encounter (Signed)
See results note. 

## 2016-05-25 ENCOUNTER — Ambulatory Visit (INDEPENDENT_AMBULATORY_CARE_PROVIDER_SITE_OTHER): Payer: Medicare Other | Admitting: Nurse Practitioner

## 2016-05-25 ENCOUNTER — Encounter: Payer: Self-pay | Admitting: Nurse Practitioner

## 2016-05-25 VITALS — BP 126/80 | HR 64 | Ht 63.5 in | Wt 107.0 lb

## 2016-05-25 DIAGNOSIS — Z0000 Encounter for general adult medical examination without abnormal findings: Secondary | ICD-10-CM | POA: Diagnosis not present

## 2016-05-25 DIAGNOSIS — N7689 Other specified inflammation of vagina and vulva: Secondary | ICD-10-CM | POA: Diagnosis not present

## 2016-05-25 DIAGNOSIS — E2839 Other primary ovarian failure: Secondary | ICD-10-CM

## 2016-05-25 DIAGNOSIS — Z01419 Encounter for gynecological examination (general) (routine) without abnormal findings: Secondary | ICD-10-CM

## 2016-05-25 DIAGNOSIS — Z1159 Encounter for screening for other viral diseases: Secondary | ICD-10-CM | POA: Diagnosis not present

## 2016-05-25 DIAGNOSIS — L309 Dermatitis, unspecified: Secondary | ICD-10-CM

## 2016-05-25 MED ORDER — FLUCONAZOLE 150 MG PO TABS: 150.0000 mg | ORAL_TABLET | Freq: Once | ORAL | 0 refills | Status: AC

## 2016-05-25 NOTE — Patient Instructions (Addendum)

## 2016-05-25 NOTE — Progress Notes (Signed)
Reviewed personally.  M. Suzanne Honor Frison, MD.  

## 2016-05-25 NOTE — Progress Notes (Deleted)
Patient ID: Megan Taylor, female   DOB: 01/20/1946, 70 y.o.   MRN: [id number]  69 y.o. G2P0002 Married  Caucasian Fe here for annual exam.  Vulvar irritation got better but now having a little irritation form Olive oil ?  Patient's last menstrual period was 10/12/1997.          Sexually active: Yes.    The current method of family planning is post menopausal status.    Exercising: Yes.    walking and stretching Smoker:  no  Health Maintenance: Pap: 05/19/13, Negative with neg HR HPV MMG: 09/30/15, Bi-Rads 1: Negative Colonoscopy:  10/2014- Normal f/u in 10 years (2026) BMD: 05/29/09 T-Score, -0.8 Spine / -1.3 Right Femur Total / -1.2 Left Femur Total TDaP: over 10 years - will discuss with PCP Shingles vaccine: 08/09/14 Pneumonia: None - will discuss with PCP Hep C: done today Labs: PCP takes care of all labs   reports that she has never smoked. She has never used smokeless tobacco. She reports that she drinks about 3.0 - 3.6 oz of alcohol per week . She reports that she does not use drugs.  Past Medical History:  Diagnosis Date  . Dry eyes    uses Restasis eye drops.  . Hip pain    "bursitis right hip"  . Melanoma ([place])   . Osteopenia    was on fosamax for awhile  . Tremor   . Wolff-Parkinson-White (WPW) syndrome 1992   heart ablation    Past Surgical History:  Procedure Laterality Date  . BREAST BIOPSY  1975   benign lump  . BREAST CYST ASPIRATION     benign  . DILATION AND CURETTAGE OF UTERUS  1977   with BTL  . EXCISION/RELEASE BURSA HIP Right 02/17/2016   Procedure: RIGHT HIP BURSECTOMY GLUTEAL TENDON REPAIR;  Surgeon: Gaynelle Arabian, MD;  Location: [facility];  Service: Orthopedics;  Laterality: Right;  . heart ablation  1992   [place]"Wolfe-Parkinson white syndrome"  . MELANOMA EXCISION Right 06/2009   right ankle  . TONSILLECTOMY      Current Outpatient Prescriptions  Medication Sig Dispense Refill  . Ascorbic Acid Buffered (BUFFERED VITAMIN C) 1000 MG CAPS Take  1,000 mg by mouth daily.    . B Complex Vitamins (B COMPLEX PO) Take 1 tablet by mouth daily.    . calcium carbonate (OS-CAL) 600 MG tablet Take 600 mg by mouth daily.    . Cholecalciferol (VITAMIN D) 2000 UNITS CAPS Take 2,000 Units by mouth daily.     . Coenzyme Q10 (CO Q 10 PO) Take 1 tablet by mouth daily.     . cycloSPORINE (RESTASIS) 0.05 % ophthalmic emulsion Place 1 drop into both eyes 2 (two) times daily.    . fish oil-omega-3 fatty acids 1000 MG capsule Take 1 g by mouth 2 (two) times daily.     Marland Kitchen ibuprofen (ADVIL) 200 MG tablet Take 600 mg by mouth every 6 (six) hours as needed (For pain.).    Marland Kitchen OVER THE COUNTER MEDICATION Take 1 capsule by mouth 2 (two) times daily. Joint Stimuflex    . Probiotic Product (PROBIOTIC DAILY PO) Take 1 capsule by mouth daily.      No current facility-administered medications for this visit.     Family History  Problem Relation Age of Onset  . Lung cancer Father 70  . Hypertension Mother   . Hyperlipidemia Mother   . Congestive Heart Failure Mother   . Hodgkin's lymphoma Paternal Grandfather   .  Hypertension Maternal Grandmother   . Hyperlipidemia Maternal Grandmother   . Congestive Heart Failure Paternal Grandmother   . Tremor Daughter     ROS:  Pertinent items are noted in HPI.  Otherwise, a comprehensive ROS was negative.  Exam:   BP 126/80 (BP Location: Right Arm, Patient Position: Sitting, Cuff Size: Normal)   Pulse 64   Ht 5' 3.5" (1.613 m)   Wt 107 lb (48.5 kg)   LMP 10/12/1997   BMI 18.66 kg/m  Height: 5' 3.5" (161.3 cm) Ht Readings from Last 3 Encounters:  05/25/16 5' 3.5" (1.613 m)  05/11/16 5' 3.5" (1.613 m)  02/17/16 5' 3.5" (1.613 m)    General appearance: alert, cooperative and appears stated age Head: Normocephalic, without obvious abnormality, atraumatic Neck: no adenopathy, supple, symmetrical, trachea midline and thyroid normal to inspection and palpation Lungs: clear to auscultation bilaterally Breasts: normal  appearance, no masses or tenderness Heart: regular rate and rhythm Abdomen: soft, non-tender; no masses,  no organomegaly Extremities: extremities normal, atraumatic, no cyanosis or edema Skin: Skin color, texture, turgor normal. No rashes or lesions Lymph nodes: Cervical, supraclavicular, and axillary nodes normal. No abnormal inguinal nodes palpated Neurologic: Grossly normal   Pelvic: External genitalia:  At the top and left side of labial folds is a faint macular rash and linear cuts along the side wall of labia that looks like yeast.  There are no crusty lesions. ? Reaction to Olive oil or coconut.              Urethra:  normal appearing urethra with no masses, tenderness or lesions              Bartholin's and Skene's: normal                 Vagina: normal appearing vagina with normal color and discharge, no lesions              Cervix: anteverted              Pap taken: Yes.   Bimanual Exam:  Uterus:  normal size, contour, position, consistency, mobility, non-tender              Adnexa: no mass, fullness, tenderness               Rectovaginal: Confirms               Anus:  normal sphincter tone, no lesions  Chaperone present: yes  A:  Well Woman with normal exam  Postmenopausal off HRT Combipatch for over 3 yrs  Osteopenia at hip sites  H/O melanoma right ankle 2010  P:   Reviewed health and wellness pertinent to exam  Pap smear as above  Mammogram is due 09/2016 and have sent an order to repeat BMD  Will try Diflucan X 2 doses and if not better in 7-10 days will call for a recheck of vulva tissue - may need to see Dr. Sabra Heck again for this  Follow with lab  Counseled on breast self exam, mammography screening, adequate intake of calcium and vitamin D, diet and exercise, Kegel's exercises return annually or prn  An After Visit Summary was printed and given to the patient.

## 2016-05-26 LAB — HEPATITIS C ANTIBODY: HCV AB: NEGATIVE

## 2016-05-26 LAB — IPS PAP SMEAR ONLY

## 2016-05-26 NOTE — Progress Notes (Signed)
Patient ID: Megan Taylor, female   DOB: [birth year], 70 y.o.   MRN: [id number]  69 y.o. G2P0002 Married  Caucasian Fe here for annual exam.  Vulvar irritation got better but now having a little irritation form Olive oil ?  Patient's last menstrual period was 10/12/1997.          Sexually active: Yes.    The current method of family planning is post menopausal status.    Exercising: Yes.    walking and stretching Smoker:  no  Health Maintenance: Pap: 05/19/13, Negative with neg HR HPV MMG: 09/30/15, Bi-Rads 1: Negative Colonoscopy:  10/2014- Normal f/u in 10 years (2026) BMD: 05/29/09 T-Score, -0.8 Spine / -1.3 Right Femur Total / -1.2 Left Femur Total TDaP: over 10 years - will discuss with PCP Shingles vaccine: 08/09/14 Pneumonia: None - will discuss with PCP Hep C: done today Labs: PCP takes care of all labs   reports that she has never smoked. She has never used smokeless tobacco. She reports that she drinks about 3.0 - 3.6 oz of alcohol per week . She reports that she does not use drugs.  Past Medical History:  Diagnosis Date  . Dry eyes    uses Restasis eye drops.  . Hip pain    "bursitis right hip"  . Melanoma ([facility])   . Osteopenia    was on fosamax for awhile  . Tremor   . Wolff-Parkinson-White (WPW) syndrome 1992   heart ablation    Past Surgical History:  Procedure Laterality Date  . BREAST BIOPSY  1975   benign lump  . BREAST CYST ASPIRATION     benign  . DILATION AND CURETTAGE OF UTERUS  1977   with BTL  . EXCISION/RELEASE BURSA HIP Right 02/17/2016   Procedure: RIGHT HIP BURSECTOMY GLUTEAL TENDON REPAIR;  Surgeon: Gaynelle Arabian, MD;  Location: [facility];  Service: Orthopedics;  Laterality: Right;  . heart ablation  1992   [place]"Wolfe-Parkinson white syndrome"  . MELANOMA EXCISION Right 06/2009   right ankle  . TONSILLECTOMY      Current Outpatient Prescriptions  Medication Sig Dispense Refill  . Ascorbic Acid Buffered (BUFFERED VITAMIN C) 1000 MG CAPS Take  1,000 mg by mouth daily.    . B Complex Vitamins (B COMPLEX PO) Take 1 tablet by mouth daily.    . calcium carbonate (OS-CAL) 600 MG tablet Take 600 mg by mouth daily.    . Cholecalciferol (VITAMIN D) 2000 UNITS CAPS Take 2,000 Units by mouth daily.     . Coenzyme Q10 (CO Q 10 PO) Take 1 tablet by mouth daily.     . cycloSPORINE (RESTASIS) 0.05 % ophthalmic emulsion Place 1 drop into both eyes 2 (two) times daily.    . fish oil-omega-3 fatty acids 1000 MG capsule Take 1 g by mouth 2 (two) times daily.     Marland Kitchen ibuprofen (ADVIL) 200 MG tablet Take 600 mg by mouth every 6 (six) hours as needed (For pain.).    Marland Kitchen OVER THE COUNTER MEDICATION Take 1 capsule by mouth 2 (two) times daily. Joint Stimuflex    . Probiotic Product (PROBIOTIC DAILY PO) Take 1 capsule by mouth daily.      No current facility-administered medications for this visit.     Family History  Problem Relation Age of Onset  . Lung cancer Father 64  . Hypertension Mother   . Hyperlipidemia Mother   . Congestive Heart Failure Mother   . Hodgkin's lymphoma Paternal Grandfather   .  Hypertension Maternal Grandmother   . Hyperlipidemia Maternal Grandmother   . Congestive Heart Failure Paternal Grandmother   . Tremor Daughter     ROS:  Pertinent items are noted in HPI.  Otherwise, a comprehensive ROS was negative.  Exam:   BP 126/80 (BP Location: Right Arm, Patient Position: Sitting, Cuff Size: Normal)   Pulse 64   Ht 5' 3.5" (1.613 m)   Wt 107 lb (48.5 kg)   LMP 10/12/1997   BMI 18.66 kg/m  Height: 5' 3.5" (161.3 cm) Ht Readings from Last 3 Encounters:  05/25/16 5' 3.5" (1.613 m)  05/11/16 5' 3.5" (1.613 m)  02/17/16 5' 3.5" (1.613 m)    General appearance: alert, cooperative and appears stated age Head: Normocephalic, without obvious abnormality, atraumatic Neck: no adenopathy, supple, symmetrical, trachea midline and thyroid normal to inspection and palpation Lungs: clear to auscultation bilaterally Breasts: normal  appearance, no masses or tenderness Heart: regular rate and rhythm Abdomen: soft, non-tender; no masses,  no organomegaly Extremities: extremities normal, atraumatic, no cyanosis or edema Skin: Skin color, texture, turgor normal. No rashes or lesions Lymph nodes: Cervical, supraclavicular, and axillary nodes normal. No abnormal inguinal nodes palpated Neurologic: Grossly normal   Pelvic: External genitalia:  At the top and left side of labial folds is a faint macular rash and linear cuts along the side wall of labia that looks like yeast.  There are no crusty lesions. ? Reaction to Olive oil or coconut.              Urethra:  normal appearing urethra with no masses, tenderness or lesions              Bartholin's and Skene's: normal                 Vagina: normal appearing vagina with normal color and discharge, no lesions              Cervix: anteverted              Pap taken: Yes.   Bimanual Exam:  Uterus:  normal size, contour, position, consistency, mobility, non-tender              Adnexa: no mass, fullness, tenderness               Rectovaginal: Confirms               Anus:  normal sphincter tone, no lesions  Chaperone present: yes  A:  Well Woman with normal exam  Postmenopausal off HRT Combipatch for over 3 yrs  Osteopenia at hip sites  H/O melanoma right ankle 2010  P:   Reviewed health and wellness pertinent to exam  Pap smear as above  Mammogram is due 09/2016 and have sent an order to repeat BMD  Will try Diflucan X 2 doses and if not better in 7-10 days will call for a recheck of vulva tissue - may need to see Dr. Sabra Heck again for this  Follow with lab  Counseled on breast self exam, mammography screening, adequate intake of calcium and vitamin D, diet and exercise, Kegel's exercises return annually or prn  An After Visit Summary was printed and given to the patient.

## 2016-08-27 ENCOUNTER — Other Ambulatory Visit: Payer: Self-pay | Admitting: Obstetrics & Gynecology

## 2016-08-27 DIAGNOSIS — Z1231 Encounter for screening mammogram for malignant neoplasm of breast: Secondary | ICD-10-CM

## 2016-09-30 ENCOUNTER — Ambulatory Visit: Payer: Medicare Other | Admitting: Neurology

## 2016-10-01 ENCOUNTER — Telehealth: Payer: Self-pay | Admitting: Neurology

## 2016-10-01 ENCOUNTER — Encounter: Payer: Self-pay | Admitting: Neurology

## 2016-10-01 ENCOUNTER — Ambulatory Visit (INDEPENDENT_AMBULATORY_CARE_PROVIDER_SITE_OTHER): Payer: Medicare Other | Admitting: Neurology

## 2016-10-01 DIAGNOSIS — R55 Syncope and collapse: Secondary | ICD-10-CM

## 2016-10-01 HISTORY — DX: Syncope and collapse: R55

## 2016-10-01 NOTE — Progress Notes (Signed)
Reason for visit: Syncope  Referring physician: Dr. Colonel Bald is a 70 y.o. female  History of present illness:  Ms. Hausmann is a 70 year old right-handed white female with a history of Wolf Parkinson White syndrome, status post ablation in 1992. The patient is no longer followed actively through a cardiologist as she has had no problems whatsoever since that time. The patient was on a flight from [place] to [place] about 10 days ago. The patient felt very tired and fatigued before boarding the plane. On the plane, she slept most of the way, but later in the flight she had perception of things going on around her, but she was not able to interact. After the plane landed, the patient would not completely alert. Her husband noted that her arms went into flexion and would jerk a couple times and then she sustained urinary incontinence. The patient was taken off of the airplane on a stretcher, and shortly after deboarding the airplane, she began feeling much better. Blood pressure at that time was [id number] systolic. The patient went to a local hospital and underwent a CT scan of the head that she claims was unremarkable. She was found to have a urinary tract infection and a potassium level of 2.7. The patient denied any history of palpitations of the heart around the time of the episode. The patient claims that the actual duration of loss of consciousness was only for a few moments around the time of the urinary incontinence and arm jerking. The patient denies any previous events. She has not had any numbness or weakness of the face, arms, or legs. She denies any balance issues or other issues controlling the bowels or the bladder. The patient claims that she has had blood work to recheck the potassium level, the results of this are unknown.  Past Medical History:  Diagnosis Date  . Dry eyes    uses Restasis eye drops.  . Hip pain    "bursitis right hip"  . Melanoma ([facility])   .  Osteopenia    was on fosamax for awhile  . Syncope and collapse 10/01/2016  . Tremor   . Wolff-Parkinson-White (WPW) syndrome 1992   heart ablation    Past Surgical History:  Procedure Laterality Date  . BREAST BIOPSY  1975   benign lump  . BREAST CYST ASPIRATION     benign  . DILATION AND CURETTAGE OF UTERUS  1977   with BTL  . EXCISION/RELEASE BURSA HIP Right 02/17/2016   Procedure: RIGHT HIP BURSECTOMY GLUTEAL TENDON REPAIR;  Surgeon: Gaynelle Arabian, MD;  Location: [facility];  Service: Orthopedics;  Laterality: Right;  . heart ablation  1992   [place]"Wolfe-Parkinson white syndrome"  . MELANOMA EXCISION Right 06/2009   right ankle  . TONSILLECTOMY      Family History  Problem Relation Age of Onset  . Lung cancer Father 60  . Hypertension Mother   . Hyperlipidemia Mother   . Congestive Heart Failure Mother   . Osteoporosis Sister 54  . Osteoporosis Sister   . Hodgkin's lymphoma Paternal Grandfather   . Hypertension Maternal Grandmother   . Hyperlipidemia Maternal Grandmother   . Congestive Heart Failure Paternal Grandmother   . Tremor Daughter     Social history:  reports that she has never smoked. She has never used smokeless tobacco. She reports that she drinks about 3.0 - 3.6 oz of alcohol per week . She reports that she does not use drugs.  Medications:  Prior to Admission medications   Medication Sig Start Date End Date Taking? Authorizing Provider  acetaminophen (TYLENOL) 325 MG tablet Take 650 mg by mouth as needed.   Yes Historical Provider, MD  Ascorbic Acid Buffered (BUFFERED VITAMIN C) 1000 MG CAPS Take 1,000 mg by mouth daily.   Yes Historical Provider, MD  aspirin 81 MG tablet Take 81 mg by mouth daily.   Yes Historical Provider, MD  B Complex Vitamins (B COMPLEX PO) Take 1 tablet by mouth daily.   Yes Historical Provider, MD  calcium carbonate (OS-CAL) 600 MG tablet Take 600 mg by mouth daily.   Yes Historical Provider, MD  Cholecalciferol (VITAMIN D) 2000  UNITS CAPS Take 2,000 Units by mouth daily.    Yes Historical Provider, MD  cycloSPORINE (RESTASIS) 0.05 % ophthalmic emulsion Place 1 drop into both eyes 2 (two) times daily.   Yes Historical Provider, MD  fish oil-omega-3 fatty acids 1000 MG capsule Take 1 g by mouth 2 (two) times daily.    Yes Historical Provider, MD  ibuprofen (ADVIL,MOTRIN) 200 MG tablet Take 200 mg by mouth as needed.   Yes Historical Provider, MD  Probiotic Product (PROBIOTIC DAILY PO) Take 1 capsule by mouth daily.    Yes Historical Provider, MD      Allergies  Allergen Reactions  . Augmentin [Amoxicillin-Pot Clavulanate] Other (See Comments)    Severe diarrhea after one dose    ROS:  Out of a complete 14 system review of symptoms, the patient complains only of the following symptoms, and all other reviewed systems are negative.  Syncope Not enough sleep  Blood pressure (!) 118/58, pulse 64, resp. rate 14, height 5' 3.5" (1.613 m), weight 110 lb (49.9 kg), last menstrual period 10/12/1997.  Physical Exam  General: The patient is alert and cooperative at the time of the examination.  Eyes: Pupils are equal, round, and reactive to light. Discs are flat bilaterally.  Neck: The neck is supple, no carotid bruits are noted.  Respiratory: The respiratory examination is clear.  Cardiovascular: The cardiovascular examination reveals a regular rate and rhythm, no obvious murmurs or rubs are noted.  Skin: Extremities are without significant edema.  Neurologic Exam  Mental status: The patient is alert and oriented x 3 at the time of the examination. The patient has apparent normal recent and remote memory, with an apparently normal attention span and concentration ability.  Cranial nerves: Facial symmetry is present. There is good sensation of the face to pinprick and soft touch bilaterally. The strength of the facial muscles and the muscles to head turning and shoulder shrug are normal bilaterally. Speech is  well enunciated, no aphasia or dysarthria is noted. Extraocular movements are full. Visual fields are full. The tongue is midline, and the patient has symmetric elevation of the soft palate. No obvious hearing deficits are noted.  Motor: The motor testing reveals 5 over 5 strength of all 4 extremities. Good symmetric motor tone is noted throughout.  Sensory: Sensory testing is intact to pinprick, soft touch, vibration sensation, and position sense on all 4 extremities. No evidence of extinction is noted.  Coordination: Cerebellar testing reveals good finger-nose-finger and heel-to-shin bilaterally.  Gait and station: Gait is normal. Tandem gait is normal. Romberg is negative. No drift is seen.  Reflexes: Deep tendon reflexes are symmetric and normal bilaterally. Toes are downgoing bilaterally.   Assessment/Plan:  1. Syncope  The patient reports an episode of a "twilight consciousness" subsequently associated with myoclonic jerks and urinary  incontinence. The event likely represented a prolonged episode of hypotension and subsequent syncope. The episode does not appear to be consistent with a seizure event. We will perform a further workup. The patient will undergo a MRI of the brain, and an EEG study. The hypokalemia could have resulted in a cardiac rhythm abnormality. Very low potassium levels may also result in paralysis, but the patient indicates that as soon as she got off of the airplane she felt more normal. I will contact the patient with the results of the above studies, she will follow-up on an as-needed basis. If the above workup is unremarkable, the patient may return to driving.  Jill Alexanders MD 10/01/2016 9:20 AM  [facility] [address] [place], [place] [address]  Phone [phone] Fax [phone]

## 2016-10-01 NOTE — Telephone Encounter (Signed)
MRI of the brain was ordered with and without gadolinium enhancement.

## 2016-10-01 NOTE — Patient Instructions (Signed)
   We will check an EEG study and MRI of the brain.

## 2016-10-01 NOTE — Telephone Encounter (Signed)
Cathy at [facility] called me and said that the MRI order needs to be changed to MRI Brain w/wo contrast.

## 2016-10-06 ENCOUNTER — Other Ambulatory Visit: Payer: Medicare Other

## 2016-10-08 ENCOUNTER — Ambulatory Visit
Admission: RE | Admit: 2016-10-08 | Discharge: 2016-10-08 | Disposition: A | Discharge status: Home / Self Care | Payer: Medicare Other | Source: Ambulatory Visit | Attending: Nurse Practitioner | Admitting: Nurse Practitioner

## 2016-10-08 ENCOUNTER — Ambulatory Visit
Admission: RE | Admit: 2016-10-08 | Discharge: 2016-10-08 | Disposition: A | Discharge status: Home / Self Care | Payer: Medicare Other | Source: Ambulatory Visit | Attending: Obstetrics & Gynecology | Admitting: Obstetrics & Gynecology

## 2016-10-08 DIAGNOSIS — E2839 Other primary ovarian failure: Secondary | ICD-10-CM

## 2016-10-08 DIAGNOSIS — Z1231 Encounter for screening mammogram for malignant neoplasm of breast: Secondary | ICD-10-CM

## 2016-10-12 HISTORY — PX: LUMBAR DISC SURGERY: SHX700

## 2016-10-13 ENCOUNTER — Ambulatory Visit
Admission: RE | Admit: 2016-10-13 | Discharge: 2016-10-13 | Disposition: A | Discharge status: Home / Self Care | Payer: Medicare Other | Source: Ambulatory Visit | Attending: Neurology | Admitting: Neurology

## 2016-10-13 DIAGNOSIS — R55 Syncope and collapse: Secondary | ICD-10-CM | POA: Diagnosis not present

## 2016-10-13 MED ORDER — GADOBENATE DIMEGLUMINE 529 MG/ML IV SOLN
10.0000 mL | Freq: Once | INTRAVENOUS | Status: AC | PRN
  Administered 2016-10-13: 10 mL via INTRAVENOUS

## 2016-10-14 ENCOUNTER — Telehealth: Payer: Self-pay | Admitting: Neurology

## 2016-10-14 NOTE — Telephone Encounter (Signed)
   I called patient. MRI the brain appears to be normal, the EEG study is pending.  MRI brain 10/14/16:  IMPRESSION:  Normal MRI brain w/wo contrast

## 2016-10-21 ENCOUNTER — Ambulatory Visit: Payer: Medicare Other | Admitting: Neurology

## 2016-10-22 DIAGNOSIS — R030 Elevated blood-pressure reading, without diagnosis of hypertension: Secondary | ICD-10-CM | POA: Diagnosis not present

## 2016-10-22 DIAGNOSIS — M5416 Radiculopathy, lumbar region: Secondary | ICD-10-CM | POA: Diagnosis not present

## 2016-10-26 ENCOUNTER — Encounter: Payer: Self-pay | Admitting: Neurology

## 2016-10-30 DIAGNOSIS — M5416 Radiculopathy, lumbar region: Secondary | ICD-10-CM | POA: Diagnosis not present

## 2016-10-30 DIAGNOSIS — R030 Elevated blood-pressure reading, without diagnosis of hypertension: Secondary | ICD-10-CM | POA: Diagnosis not present

## 2016-11-05 ENCOUNTER — Telehealth: Payer: Self-pay | Admitting: Neurology

## 2016-11-05 ENCOUNTER — Ambulatory Visit (INDEPENDENT_AMBULATORY_CARE_PROVIDER_SITE_OTHER): Payer: Medicare Other | Admitting: Neurology

## 2016-11-05 DIAGNOSIS — R55 Syncope and collapse: Secondary | ICD-10-CM | POA: Diagnosis not present

## 2016-11-05 NOTE — Procedures (Signed)
    History:  Megan Taylor is a 71 year old patient with a history of an event of altered consciousness that occurred while flying on an airplane. The patient was unable to move, she was unable to respond. She had urinary incontinence and some arm jerking. The patient is being evaluated for possible seizure-type events.  This is a routine EEG. No skull defects are noted. Medications include Tylenol, aspirin, multivitamins, calcium supplementation, vitamin D supplementation, fish oil, and ibuprofen.    EEG classification: Normal awake and drowsy  Description of the recording: The background rhythms of this recording consists of a fairly well modulated medium amplitude alpha rhythm of 8 Hz that is reactive to eye opening and closure. As the record progresses, the patient appears to remain in the waking state throughout the recording. Photic stimulation was performed, resulting in a bilateral and symmetric photic driving response. Hyperventilation was also performed, resulting in a minimal buildup of the background rhythm activities without significant slowing seen. Toward the end of the recording, the patient enters the drowsy state with slight symmetric slowing seen. The patient never enters stage II sleep. At no time during the recording does there appear to be evidence of spike or spike wave discharges or evidence of focal slowing. EKG monitor shows no evidence of cardiac rhythm abnormalities with a heart rate of 66.  Impression: This is a normal EEG recording in the waking and drowsy state. No evidence of ictal or interictal discharges are seen.

## 2016-11-05 NOTE — Telephone Encounter (Signed)
I called patient. The EEG study done was normal. I discussed this with the patient.

## 2016-11-24 DIAGNOSIS — M5416 Radiculopathy, lumbar region: Secondary | ICD-10-CM | POA: Diagnosis not present

## 2016-11-30 DIAGNOSIS — Z01812 Encounter for preprocedural laboratory examination: Secondary | ICD-10-CM | POA: Diagnosis not present

## 2016-11-30 DIAGNOSIS — Z01818 Encounter for other preprocedural examination: Secondary | ICD-10-CM | POA: Diagnosis not present

## 2016-11-30 DIAGNOSIS — M79604 Pain in right leg: Secondary | ICD-10-CM | POA: Diagnosis not present

## 2016-11-30 DIAGNOSIS — M150 Primary generalized (osteo)arthritis: Secondary | ICD-10-CM | POA: Diagnosis not present

## 2016-11-30 DIAGNOSIS — M545 Low back pain: Secondary | ICD-10-CM | POA: Diagnosis not present

## 2016-11-30 DIAGNOSIS — M47816 Spondylosis without myelopathy or radiculopathy, lumbar region: Secondary | ICD-10-CM | POA: Diagnosis not present

## 2016-11-30 DIAGNOSIS — Z01810 Encounter for preprocedural cardiovascular examination: Secondary | ICD-10-CM | POA: Diagnosis not present

## 2016-11-30 DIAGNOSIS — M5416 Radiculopathy, lumbar region: Secondary | ICD-10-CM | POA: Diagnosis not present

## 2016-11-30 DIAGNOSIS — M79651 Pain in right thigh: Secondary | ICD-10-CM | POA: Diagnosis not present

## 2016-11-30 DIAGNOSIS — M4807 Spinal stenosis, lumbosacral region: Secondary | ICD-10-CM | POA: Diagnosis not present

## 2016-11-30 DIAGNOSIS — M5127 Other intervertebral disc displacement, lumbosacral region: Secondary | ICD-10-CM | POA: Diagnosis not present

## 2016-12-01 DIAGNOSIS — M5117 Intervertebral disc disorders with radiculopathy, lumbosacral region: Secondary | ICD-10-CM | POA: Diagnosis not present

## 2016-12-01 DIAGNOSIS — Z01818 Encounter for other preprocedural examination: Secondary | ICD-10-CM | POA: Diagnosis not present

## 2016-12-02 DIAGNOSIS — M48062 Spinal stenosis, lumbar region with neurogenic claudication: Secondary | ICD-10-CM | POA: Diagnosis not present

## 2016-12-02 DIAGNOSIS — M4726 Other spondylosis with radiculopathy, lumbar region: Secondary | ICD-10-CM | POA: Diagnosis not present

## 2016-12-02 DIAGNOSIS — M5116 Intervertebral disc disorders with radiculopathy, lumbar region: Secondary | ICD-10-CM | POA: Diagnosis not present

## 2016-12-15 DIAGNOSIS — M5431 Sciatica, right side: Secondary | ICD-10-CM | POA: Diagnosis not present

## 2016-12-15 DIAGNOSIS — Z9889 Other specified postprocedural states: Secondary | ICD-10-CM | POA: Diagnosis not present

## 2017-01-26 ENCOUNTER — Encounter: Payer: Self-pay | Admitting: Sports Medicine

## 2017-01-26 ENCOUNTER — Ambulatory Visit (INDEPENDENT_AMBULATORY_CARE_PROVIDER_SITE_OTHER): Payer: Medicare Other | Admitting: Sports Medicine

## 2017-01-26 ENCOUNTER — Ambulatory Visit: Payer: Self-pay

## 2017-01-26 VITALS — BP 124/78 | HR 78 | Ht 63.5 in | Wt 110.4 lb

## 2017-01-26 DIAGNOSIS — M25551 Pain in right hip: Secondary | ICD-10-CM | POA: Diagnosis not present

## 2017-01-26 DIAGNOSIS — M6798 Unspecified disorder of synovium and tendon, other site: Secondary | ICD-10-CM

## 2017-01-26 DIAGNOSIS — M47816 Spondylosis without myelopathy or radiculopathy, lumbar region: Secondary | ICD-10-CM | POA: Diagnosis not present

## 2017-01-26 DIAGNOSIS — M7061 Trochanteric bursitis, right hip: Secondary | ICD-10-CM | POA: Diagnosis not present

## 2017-01-26 DIAGNOSIS — M67951 Unspecified disorder of synovium and tendon, right thigh: Secondary | ICD-10-CM

## 2017-01-26 NOTE — Progress Notes (Signed)
OFFICE VISIT NOTE [name]. Karen Kinnard, [place] at [facility] [phone]  Megan Taylor - 71 y.o. female MRN [id number]  Date of birth: 1946-02-05  Visit Date: 01/26/2017  PCP: Lilian Coma, MD   Referred by: Jonathon Jordan, MD  Burlene Arnt, CMA acting as scribe for Dr. Paulla Fore.  SUBJECTIVE:   Chief Complaint  Patient presents with  . pain in hip   Pain in right hip. Pain started 2 years ago. She had hip surgery May 8th, 2017 performed by Dr. Maureen Ralphs. Pt had RIGHT HIP BURSECTOMY GLUTEAL TENDON REPAIR last year. She then had back surgery 8 weeks ago at [facility], performed by Dr. Rosana Hoes.   Pain is constant and mainly located over the lateral hip over the greater trochanter.  She has difficult time laying on this.  She has the sensation of fullness in having it feels like it is filling up like a balloon. She does have focal TTP over the greater trochanter  Prior to surgery her hip was radiating down the medial aspect of her thigh and towards her toes.  This has resolved since her low back surgery The pain is described as throbbing, dull ache, tender and is rated as 3/10 currently.  Worsened with laying on the right side. Laying on either hip, in general, causes the right hip to hurt more.  Improves with changes positions while laying.  Therapies tried include Steroid injection which gave minimal relief. Pt did PT before and after surgery and still has not had any relief of hip pain. She has taken Ibuprofen and did not get much relief so she doesn't really take it anymore.   Other associated symptoms include: radiation down lateral aspect of leg. The pain radiates from the hip down towards the knee but not past at this time.   Dr. Maureen Ralphs felt like the pain that she was having in her hip was coming from her back and that's why she had back surgery. She still has no relief from the hip pain and is still very sore from  the back surgery. The incision site from the hip surgery is still tender and at times still stings.   She had MRI L-spine 04/2016 that showed the following:  1. L4-L5 right lateral disc protrusion, possibly contacting the exiting right L4 nerve root. Correlate for symptoms of right L4 radiculopathy. 2. L4-5 central disc extrusion with minimal caudal migration. No spinal canal stenosis. 3. Multilevel severe facet arthrosis, worst at L3-L4.  Otherwise ROS as it pertains to the Chief Complaint is as below:  Denies fevers, chills, recent weight gain or weight loss.  No night sweats. No significant nighttime awakenings due to this issue. Pt denies any change in bowel or bladder habits, muscle weakness, numbness or falls associated with this pain.    Review of Systems  Constitutional: Negative for chills, fever, malaise/fatigue and weight loss.  Cardiovascular: Negative for chest pain, palpitations and leg swelling.  Gastrointestinal: Negative for nausea and vomiting.  Musculoskeletal: Positive for back pain and joint pain. Negative for falls.  Skin: Negative for rash.  Neurological: Negative for dizziness, tingling and headaches.  Endo/Heme/Allergies: Does not bruise/bleed easily.  Psychiatric/Behavioral: The patient does not have insomnia.     Otherwise per HPI.  HISTORY & PERTINENT PRIOR DATA:  No specialty comments available. She reports that she has never smoked. She has never used smokeless tobacco. No results for input(s): HGBA1C, LABURIC in the  last 8760 hours. @1HISTORY @  OBJECTIVE:  VS:  HT:5' 3.5" (161.3 cm)   WT:110 lb 6.4 oz (50.1 kg)  BMI:19.3    BP:124/78  HR:78bpm  TEMP: ( )  RESP:94 % Physical Exam  Constitutional: She appears well-developed and well-nourished. She is cooperative.  Non-toxic appearance.  HENT:  Head: Normocephalic and atraumatic.  Cardiovascular: Intact distal pulses.   Pulmonary/Chest: No accessory muscle usage. No respiratory distress.    Neurological: She is alert. She is not disoriented. She displays normal reflexes. No sensory deficit.  Skin: Skin is warm, dry and intact. Capillary refill takes less than 2 seconds. No abrasion and no rash noted.  Psychiatric: She has a normal mood and affect. Her speech is normal and behavior is normal. Thought content normal.   Back & Lower Extremities:  No significant rashes/lesions/ulcerations overlying the legs or back.    Well-healed lumbar postsurgical scar.  No significant pretibial edema.  No LE clubbing or cyanosis.  DP & PT pulses 2+/4.  LE Sensation intact to light touch in LE dermatomes.  Bilateral negative straight leg raise.  No significant midline tenderness.    Marked TTP over the greater trochanter and glute medius musculature.  Minimal pain over the piriformis and moderate pain over the belly of the ITB.  Good internal and external rotation of the hips.  Without reproduction of hip pain.  Manual muscle testing is 5+/5 in BLE myotomes without focality  Lower extremity DTRs 2+/4 diffusely and symmetric  Markedly weak with right-sided hip abduction testing with tensor fascia lata predominance.  No pain with Stinchfield testing, FABER or FADIR.     IMAGING & PROCEDURES: No results found. No additional findings.   PROCEDURE NOTE: ULTRASOUND GUIDED RIGHT GREATER TROCHANTERIC BURSALINJECTION  Images were obtained and interpreted by myself, Teresa Coombs, DO  Images have been saved and stored to PACS system. Images obtained on: GE S7 Ultrasound machine  ULTRASOUND FINDINGS: Right greater trochanter has postsurgical changes with suturing in the inferior portion of the muscle belly insertions.  There is a large bursa that is present superficial to the musculature that projects down approximately 3 cm distally.  This is the area of maximal focal tenderness.  Glute medius musculature appears to be intact however significant fraying thickening and hypoechoic  change within the tendon.  There is only minimal neovascularity within the diseased tissue.  DESCRIPTION OF PROCEDURE:  The patient's clinical condition is marked by substantial pain and/or significant functional disability. Other conservative therapy has not provided relief, is contraindicated, or not appropriate. There is a reasonable likelihood that injection will significantly improve the patient's pain and/or functional impairment. After discussing the risks, benefits and expected outcomes of the injection and all questions were reviewed and answered, the patient wished to undergo the above named procedure. Verbal consent was obtained. The ultrasound was used to identify the target structure and adjacent neurovascular structures. The skin was then prepped in sterile fashion and the target structure was injected under direct visualization using sterile technique as below: PREP: Alcohol, Ethel Chloride APPROACH: lateral, 21g 2" needle INJECTATE: 2cc 0.5% marcaine, 2cc 40mg  DepoMedrol ASPIRATE: N/A DRESSING: Band-Aid   Post procedural instructions including recommending icing and warning signs for infection were reviewed. This procedure was well tolerated and there were no complications.   IMPRESSION: Succesful US Guided Injection of prominent R Greater Trochanteric Bursa    ASSESSMENT & PLAN:  Visit Diagnoses:  1. Right hip pain   2. Tendinopathy of right gluteus medius   3.  Lumbar spondylosis   4. Greater trochanteric bursitis of right hip    Meds: No orders of the defined types were placed in this encounter.   Orders:  Orders Placed This Encounter  Procedures  . [place] LIMITED JOINT SPACE STRUCTURES LOW RIGHT(NO LINKED CHARGES)  . Ambulatory referral to Physical Therapy    Follow-up: Return in about 2 weeks (around 02/09/2017).  Otherwise please see problem oriented charting as below.  CMA/ATC served as Education administrator during this visit. History, Physical, and Plan performed by medical  provider. Documentation and orders reviewed and attested to.      Teresa Coombs, [facility] Physician    01/27/2017 8:20 AM

## 2017-01-27 DIAGNOSIS — M7061 Trochanteric bursitis, right hip: Secondary | ICD-10-CM | POA: Insufficient documentation

## 2017-01-27 NOTE — Assessment & Plan Note (Signed)
She is status post lumbar discectomy.  Referral physical therapy has been placed for recovery.  Ultimately I do think she has multiple things that are contributing to her hip and leg pain.  It sounds as though her radicular pain that was going down the medial aspect of her thigh towards her feet and ankles has resolved with the surgery however the underlying dysfunctional neuromuscular recruitment pattern is likely also continuing to contribute to some of her gluteal dysfunction.  Once again therapeutic exercises emphasized at this time.

## 2017-01-27 NOTE — Assessment & Plan Note (Signed)
Underlying marked tendinopathy of the glute medius tendon is likely the ongoing nidus for the majority of her focal hip pain.  I believe this is resulting in significant iliotibial friction syndrome at the hip.  She is markedly weak and hip abduction and she does have a predominant TFL recruitment pattern.  Formal physical therapy with the focus on Glute medius recruitment and eccentric phase will need to be emphasized.  Referral placed.  She also has a prior referral for physical therapy for her recovery from lumbar spine surgery.

## 2017-01-27 NOTE — Assessment & Plan Note (Signed)
Patient has previously undergone a bursectomy although has a prominent bursa today on [facility] ultrasound.  Suspect this is recurrent due to the underlying dysfunction as outlined above.  Injection today performed for symptomatic control but she will need to likely be started on a nitroglycerin protocol in addition to the therapeutic exercises.  I would like to see her back in 2 weeks reevaluate again with MSK ultrasound and check on the bursal swelling as well as discuss adding nitroglycerin.

## 2017-01-28 ENCOUNTER — Ambulatory Visit (INDEPENDENT_AMBULATORY_CARE_PROVIDER_SITE_OTHER): Payer: Medicare Other | Admitting: Physical Therapy

## 2017-01-28 DIAGNOSIS — M25551 Pain in right hip: Secondary | ICD-10-CM | POA: Diagnosis not present

## 2017-01-28 DIAGNOSIS — M6281 Muscle weakness (generalized): Secondary | ICD-10-CM | POA: Diagnosis not present

## 2017-01-28 NOTE — Patient Instructions (Addendum)
1.  Bring knee up to opposite shoulder and hold for 30 seconds.  Repeat 2-3 times.  Perform 2 sessions per day.  2.  With band around thighs, perform bridge and hold resistance against the band.  Hold for 5 seconds.  Perform 10 reps.  Do 2 sessions per day.   3. With band looped around feet, step right foot slightly back and kick leg out to the side.  LEAD WITH HEEL!  Perform 10 reps on each leg.  Do 2 sessions per day.

## 2017-01-28 NOTE — Therapy (Signed)
[facility] [address] [place], [place], [address] Phone: [phone]   Fax:  [phone]  Physical Therapy Evaluation  Patient Details  Name: Megan Taylor MRN: [id number] Date of Birth: 11/17/1945 Referring Provider: Dr. Teresa Coombs  Encounter Date: 01/28/2017      PT End of Session - 01/28/17 0857    Visit Number 1   Number of Visits 12   Date for PT Re-Evaluation 03/11/17   Authorization Type Medicare   PT Start Time 0805   PT Stop Time 0850   PT Time Calculation (min) 45 min   Activity Tolerance Patient tolerated treatment well   Behavior During Therapy [facility] for tasks assessed/performed      Past Medical History:  Diagnosis Date  . Dry eyes    uses Restasis eye drops.  . Hip pain    "bursitis right hip"  . Melanoma ([place])   . Osteopenia    was on fosamax for awhile  . Syncope and collapse 10/01/2016  . Tremor   . Wolff-Parkinson-White (WPW) syndrome 1992   heart ablation    Past Surgical History:  Procedure Laterality Date  . BREAST BIOPSY  1975   benign lump  . BREAST CYST ASPIRATION     benign  . DILATION AND CURETTAGE OF UTERUS  1977   with BTL  . EXCISION/RELEASE BURSA HIP Right 02/17/2016   Procedure: RIGHT HIP BURSECTOMY GLUTEAL TENDON REPAIR;  Surgeon: Gaynelle Arabian, MD;  Location: [facility];  Service: Orthopedics;  Laterality: Right;  . heart ablation  1992   [place]"Wolfe-Parkinson white syndrome"  . MELANOMA EXCISION Right 06/2009   right ankle  . TONSILLECTOMY      There were no vitals filed for this visit.       Subjective Assessment - 01/28/17 0809    Subjective Pt is a 71 y/o female who presents to OPPT with Rt hip pain x 2 years.  Pt has had physical therapy, injections and even surgery last May.  Pt reports following surgery MD felt pain may have been coming from back, with radicular symptoms.  Pt then went to [facility] for back surgery (L4/5 hemilaminectomy, medial facetectomy, foraminotomy) on  12/02/16.  Pt reports some continued radicular pain on lateral lower extremity to foot.    Limitations Sitting;House hold activities   How long can you sit comfortably? with legs crossed   Patient Stated Goals improve pain and strength, sleep better   Currently in Pain? Yes   Pain Score 0-No pain  up to 9/10   Pain Location Hip   Pain Orientation Right   Pain Descriptors / Indicators Throbbing;Dull;Aching   Pain Type Chronic pain   Pain Radiating Towards Rt knee and sometimes to foot   Pain Onset More than a month ago   Pain Frequency Intermittent   Aggravating Factors  sleeping, can tell it's there "all the time"   Pain Relieving Factors repositioning            [facility] PT Assessment - 01/28/17 0815      Assessment   Medical Diagnosis Rt hip pain   Referring Provider Dr. Teresa Coombs   Onset Date/Surgical Date --  2 years   Next MD Visit 02/09/17   Prior Therapy before surgery and following     Precautions   Precautions Other (comment)   Precaution Comments back precautions-no lifting > 25#; no traction x 3 months (03/01/17)     Restrictions   Weight Bearing Restrictions No  Balance Screen   Has the patient fallen in the past 6 months No   Has the patient had a decrease in activity level because of a fear of falling?  No   Is the patient reluctant to leave their home because of a fear of falling?  No     Home Environment   Living Environment Private residence   Living Arrangements Spouse/significant other   Type of [place] to enter   Entrance Stairs-Number of Steps 2   [place] One level   Additional Comments denies difficulty with stairs     Prior Function   Level of Independence Independent   Vocation Retired   Public librarian, walking, reading     Posture/Postural Control   Posture/Postural Control No significant limitations   Posture Comments pt demonstrated proper body mechanics with activities  during eval     AROM   Overall AROM Comments bil LEs WNL     Strength   Strength Assessment Site Hip;Knee;Ankle   Right/Left Hip Right;Left   Right Hip Flexion 3/5   Right Hip Extension 4/5   Right Hip External Rotation  3+/5   Right Hip Internal Rotation 3/5   Right Hip ABduction 3/5   Right Hip ADduction 3/5   Left Hip Flexion 5/5   Left Hip Extension 4/5   Left Hip External Rotation 4/5   Left Hip Internal Rotation 3+/5   Left Hip ABduction 4/5   Left Hip ADduction 3+/5   Right/Left Knee Right;Left   Right Knee Flexion 5/5   Right Knee Extension 5/5   Left Knee Flexion 5/5   Left Knee Extension 5/5   Right/Left Ankle Right;Left   Right Ankle Dorsiflexion 5/5   Left Ankle Dorsiflexion 5/5     Palpation   Palpation comment tenderness Rt greater trochanter; glut med     Ambulation/Gait   Gait Comments pt amb independently                   [facility] Adult PT Treatment/Exercise - 01/28/17 0815      Exercises   Exercises Knee/Hip     Knee/Hip Exercises: Stretches   Piriformis Stretch Right;1 rep;30 seconds     Knee/Hip Exercises: Standing   Hip Abduction Both;10 reps;Knee straight   Abduction Limitations with red theraband, slight hip extension; min cues for proper form     Knee/Hip Exercises: Supine   Bridges Both;10 reps   Bridges Limitations with red theraband holding isometric abdct/er                PT Education - 01/28/17 0857    Education provided Yes   Education Details clinical findings, POC, goals of care, HEP   Person(s) Educated Patient   Methods Explanation;Demonstration;Handout   Comprehension Verbalized understanding;Returned demonstration             PT Long Term Goals - 01/28/17 0903      PT LONG TERM GOAL #1   Title independent with HEP (03/11/17)   Time 6   Period Weeks   Status New     PT LONG TERM GOAL #2   Title report ability to sleep on Rt side without increase in pain (03/11/17)   Time 6   Period Weeks    Status New     PT LONG TERM GOAL #3   Title demonstrate at least 4/5 strength Rt hip for improved functional mobiltiy and decreased risk of  reinjury (03/11/17)   Time 6   Period Weeks   Status New               Plan - 01/28/17 0857    Clinical Impression Statement Pt is a 71 y/o female who presents to OPPT for low complexity PT eval for Rt hip pain with recent back surgery.  Pt has order for PT following back surgery and will address weakness and functional deficits through exercise and education.  Pt demonstrates pain affecting daily life, as well as weakness and decreased mobility.  Pt will benefit from PT to address deficits listed.   Rehab Potential Excellent   Clinical Impairments Affecting Rehab Potential recent hip and back surgery   PT Frequency 2x / week   PT Duration 6 weeks   PT Treatment/Interventions ADLs/Self Care Home Management;Cryotherapy;Electrical Stimulation;Iontophoresis 4mg /ml Dexamethasone;Ultrasound;Moist Heat;Neuromuscular re-education;Therapeutic exercise;Therapeutic activities;Functional mobility training;Patient/family education;Manual techniques;Taping;Dry needling   PT Next Visit Plan review HEP, add ER/IR to HEP, glut med strengthening   Consulted and Agree with Plan of Care Patient      Patient will benefit from skilled therapeutic intervention in order to improve the following deficits and impairments:  Pain, Decreased strength, Decreased mobility, Impaired flexibility  Visit Diagnosis: Pain in right hip - Plan: PT plan of care cert/re-cert  Muscle weakness (generalized) - Plan: PT plan of care cert/re-cert     Problem List Patient Active Problem List   Diagnosis Date Noted  . Greater trochanteric bursitis of right hip 01/27/2017  . Lumbar spondylosis 01/26/2017  . Syncope and collapse 10/01/2016  . Tendinopathy of right gluteus medius 02/17/2016  . METATARSALGIA 02/05/2009  . BUNION, LEFT FOOT 02/05/2009  . FOOT PAIN, BILATERAL  02/05/2009  . TALIPES CAVUS 02/05/2009      Laureen Abrahams, PT, DPT 01/28/17 9:46 AM    [facility] [place], [place], [address] Phone: [phone]   Fax:  [phone]  Name: AIDAH FORQUER MRN: [id number] Date of Birth: 1946-03-02

## 2017-02-04 ENCOUNTER — Ambulatory Visit (INDEPENDENT_AMBULATORY_CARE_PROVIDER_SITE_OTHER): Payer: Medicare Other | Admitting: Physical Therapy

## 2017-02-04 DIAGNOSIS — M6281 Muscle weakness (generalized): Secondary | ICD-10-CM

## 2017-02-04 DIAGNOSIS — M25551 Pain in right hip: Secondary | ICD-10-CM

## 2017-02-04 NOTE — Therapy (Signed)
[facility] [place], [place], [address] Phone: [phone]   Fax:  [phone]  Physical Therapy Treatment  Patient Details  Name: Megan Taylor MRN: [id number] Date of Birth: 1946-08-04 Referring Provider: Dr. Teresa Coombs  Encounter Date: 02/04/2017      PT End of Session - 02/04/17 1011    Visit Number 2   Number of Visits 12   Date for PT Re-Evaluation 03/11/17   Authorization Type Medicare   PT Start Time 0929   PT Stop Time 1009   PT Time Calculation (min) 40 min   Activity Tolerance Patient tolerated treatment well   Behavior During Therapy [facility] for tasks assessed/performed      Past Medical History:  Diagnosis Date  . Dry eyes    uses Restasis eye drops.  . Hip pain    "bursitis right hip"  . Melanoma ([place])   . Osteopenia    was on fosamax for awhile  . Syncope and collapse 10/01/2016  . Tremor   . Wolff-Parkinson-White (WPW) syndrome 1992   heart ablation    Past Surgical History:  Procedure Laterality Date  . BREAST BIOPSY  1975   benign lump  . BREAST CYST ASPIRATION     benign  . DILATION AND CURETTAGE OF UTERUS  1977   with BTL  . EXCISION/RELEASE BURSA HIP Right 02/17/2016   Procedure: RIGHT HIP BURSECTOMY GLUTEAL TENDON REPAIR;  Surgeon: Gaynelle Arabian, MD;  Location: [facility];  Service: Orthopedics;  Laterality: Right;  . heart ablation  1992   [place]"Wolfe-Parkinson white syndrome"  . MELANOMA EXCISION Right 06/2009   right ankle  . TONSILLECTOMY      There were no vitals filed for this visit.      Subjective Assessment - 02/04/17 0928    Subjective went to [facility] and did exercises while she was gone.  feels she's sleeping better and Rt hip is feeling better.  "Feels like there's something there."   Patient Stated Goals improve pain and strength, sleep better   Pain Score 0-No pain                         [facility] Adult PT Treatment/Exercise - 02/04/17 0933      Knee/Hip Exercises: Stretches   Piriformis Stretch 2 reps;30 seconds;Both   Other Knee/Hip Stretches ITB stretch 2x30 sec bil     Knee/Hip Exercises: Aerobic   Recumbent Bike L2 x 6 min     Knee/Hip Exercises: Standing   Hip Abduction Both;10 reps;Knee straight   Abduction Limitations with red theraband, slight hip extension   Hip Extension Both;10 reps;Knee straight   Extension Limitations with red theraband   SLS RLE with LLE march/kick back x 10 reps     Knee/Hip Exercises: Seated   Sit to Sand 10 reps;without UE support  with red theraband and focus on standing with RLE     Knee/Hip Exercises: Supine   Bridges Both;10 reps   Bridges Limitations with red theraband holding isometric abdct/er   Other Supine Knee/Hip Exercises hooklying single limb hip abdct/er x 10 bil with red theraband   Other Supine Knee/Hip Exercises bridging on orange physioball with and without hamstring curl x 10 each                PT Education - 02/04/17 1011    Education provided Yes   Education Details HEP   Person(s) Educated Patient   Methods Explanation;Demonstration;Handout  Comprehension Verbalized understanding;Returned demonstration             PT Long Term Goals - 01/28/17 0903      PT LONG TERM GOAL #1   Title independent with HEP (03/11/17)   Time 6   Period Weeks   Status New     PT LONG TERM GOAL #2   Title report ability to sleep on Rt side without increase in pain (03/11/17)   Time 6   Period Weeks   Status New     PT LONG TERM GOAL #3   Title demonstrate at least 4/5 strength Rt hip for improved functional mobiltiy and decreased risk of reinjury (03/11/17)   Time 6   Period Weeks   Status New               Plan - 02/04/17 1011    Clinical Impression Statement Pt is independent with initial HEP and added additional exercises today.  Tolerated session well without increase in pain, and pt feels pain is improving and expresses improved sleep.  Will  continue to benefit from PT to maximize function.   Clinical Impairments Affecting Rehab Potential recent hip and back surgery   PT Treatment/Interventions ADLs/Self Care Home Management;Cryotherapy;Electrical Stimulation;Iontophoresis 4mg /ml Dexamethasone;Ultrasound;Moist Heat;Neuromuscular re-education;Therapeutic exercise;Therapeutic activities;Functional mobility training;Patient/family education;Manual techniques;Taping;Dry needling   PT Next Visit Plan review HEP, add ER/IR to HEP, glut med strengthening   Consulted and Agree with Plan of Care Patient      Patient will benefit from skilled therapeutic intervention in order to improve the following deficits and impairments:  Pain, Decreased strength, Decreased mobility, Impaired flexibility  Visit Diagnosis: Pain in right hip  Muscle weakness (generalized)     Problem List Patient Active Problem List   Diagnosis Date Noted  . Greater trochanteric bursitis of right hip 01/27/2017  . Lumbar spondylosis 01/26/2017  . Syncope and collapse 10/01/2016  . Tendinopathy of right gluteus medius 02/17/2016  . METATARSALGIA 02/05/2009  . BUNION, LEFT FOOT 02/05/2009  . FOOT PAIN, BILATERAL 02/05/2009  . TALIPES CAVUS 02/05/2009      Laureen Abrahams, PT, DPT 02/04/17 10:13 AM    [facility] [place], [place], [address] Phone: [phone]   Fax:  [phone]  Name: Megan Taylor MRN: [id number] Date of Birth: 1946-06-07

## 2017-02-04 NOTE — Patient Instructions (Signed)
   1.  With red band around ankles, keep knee and back straight and kick leg straight back.  Perform 10 reps on each side.  Do 1-2 sessions per day.  2.  With gym ball, lift hips up and hold 5 seconds.  Perform 10 reps.  3. With gym ball, lift hips up and bend knees rolling ball towards you then away from you.  Lower hips back down.  Repeat 10 times.

## 2017-02-08 ENCOUNTER — Encounter: Payer: Self-pay | Admitting: Sports Medicine

## 2017-02-08 ENCOUNTER — Ambulatory Visit (INDEPENDENT_AMBULATORY_CARE_PROVIDER_SITE_OTHER): Payer: Medicare Other | Admitting: Physical Therapy

## 2017-02-08 ENCOUNTER — Ambulatory Visit (INDEPENDENT_AMBULATORY_CARE_PROVIDER_SITE_OTHER): Payer: Medicare Other | Admitting: Sports Medicine

## 2017-02-08 VITALS — BP 100/80 | HR 64 | Ht 63.5 in | Wt 109.6 lb

## 2017-02-08 DIAGNOSIS — M47816 Spondylosis without myelopathy or radiculopathy, lumbar region: Secondary | ICD-10-CM | POA: Diagnosis not present

## 2017-02-08 DIAGNOSIS — M7061 Trochanteric bursitis, right hip: Secondary | ICD-10-CM | POA: Diagnosis not present

## 2017-02-08 DIAGNOSIS — M6281 Muscle weakness (generalized): Secondary | ICD-10-CM

## 2017-02-08 DIAGNOSIS — M67951 Unspecified disorder of synovium and tendon, right thigh: Secondary | ICD-10-CM

## 2017-02-08 DIAGNOSIS — M6798 Unspecified disorder of synovium and tendon, other site: Secondary | ICD-10-CM

## 2017-02-08 DIAGNOSIS — M25551 Pain in right hip: Secondary | ICD-10-CM | POA: Diagnosis not present

## 2017-02-08 MED ORDER — NITROGLYCERIN 0.2 MG/HR TD PT24: MEDICATED_PATCH | TRANSDERMAL | 1 refills | Status: DC

## 2017-02-08 NOTE — Patient Instructions (Signed)

## 2017-02-08 NOTE — Progress Notes (Signed)
OFFICE VISIT NOTE Megan Taylor. Megan Taylor, [place] at [facility] [phone]  Megan Taylor - 71 y.o. female MRN [id number]  Date of birth: [birth year]  Visit Date: 02/08/2017  PCP: Megan Jordan, MD   Referred by: Megan Jordan, MD  [address] cma acting as scribe for Dr. Paulla Taylor.  SUBJECTIVE:   Chief Complaint  Patient presents with  . Follow-up RT Hip Pain   Pt received a steriod injection on 01/26/2017 (last visit) and reports improvement 1 week later. Pain is still present typically triggered at night when laying down. Severity has decreased and allowing her more sleep at night. Today was her 3rd visit with PT and doing well.     Review of Systems  Constitutional: Negative for chills, fever and weight loss.  Cardiovascular: Negative for leg swelling.  Skin: Negative for rash.  Neurological: Negative for sensory change and focal weakness.    Otherwise per HPI.  HISTORY & PERTINENT PRIOR DATA:  No specialty comments available. She reports that she has never smoked. She has never used smokeless tobacco. No results for input(s): HGBA1C, LABURIC in the last 8760 hours. Medications & Allergies reviewed per EMR Patient Active Problem List   Diagnosis Date Noted  . Greater trochanteric bursitis of right hip 01/27/2017  . Lumbar spondylosis 01/26/2017  . Syncope and collapse 10/01/2016  . Tendinopathy of right gluteus medius 02/17/2016  . METATARSALGIA 02/05/2009  . BUNION, LEFT FOOT 02/05/2009  . FOOT PAIN, BILATERAL 02/05/2009  . TALIPES CAVUS 02/05/2009   Past Medical History:  Diagnosis Date  . Dry eyes    uses Restasis eye drops.  . Hip pain    "bursitis right hip"  . Melanoma ([place])   . Osteopenia    was on fosamax for awhile  . Syncope and collapse 10/01/2016  . Tremor   . Wolff-Parkinson-White (WPW) syndrome 1992   heart ablation   Family History  Problem Relation Age of Onset  . Lung cancer Father 46  .  Hypertension Mother   . Hyperlipidemia Mother   . Congestive Heart Failure Mother   . Osteoporosis Sister 56  . Osteoporosis Sister   . Hodgkin's lymphoma Paternal Grandfather   . Hypertension Maternal Grandmother   . Hyperlipidemia Maternal Grandmother   . Congestive Heart Failure Paternal Grandmother   . Tremor Daughter    Past Surgical History:  Procedure Laterality Date  . BREAST BIOPSY  1975   benign lump  . BREAST CYST ASPIRATION     benign  . DILATION AND CURETTAGE OF UTERUS  1977   with BTL  . EXCISION/RELEASE BURSA HIP Right 02/17/2016   Procedure: RIGHT HIP BURSECTOMY GLUTEAL TENDON REPAIR;  Surgeon: Megan Arabian, MD;  Location: [facility];  Service: Orthopedics;  Laterality: Right;  . heart ablation  1992   [place]"Wolfe-Parkinson white syndrome"  . MELANOMA EXCISION Right 06/2009   right ankle  . TONSILLECTOMY     Social History   Occupational History  . Retired    Social History Main Topics  . Smoking status: Never Smoker  . Smokeless tobacco: Never Used  . Alcohol use 3.0 - 3.6 oz/week    5 - 6 Standard drinks or equivalent per week     Comment: wine  . Drug use: No  . Sexual activity: Yes    Partners: Male    Birth control/ protection: Post-menopausal    OBJECTIVE:  VS:  HT:5' 3.5" (161.3 cm)   WT:109 lb 9.6 oz (  49.7 kg)  BMI:19.2    BP:100/80  HR:64bpm  TEMP: ( )  RESP:99 % Physical Exam Findings:  WDWN, NAD, Non-toxic appearing Alert & appropriately interactive Not depressed or anxious appearing No increased work of breathing. Pupils are equal. EOM intact without nystagmus No clubbing or cyanosis of the extremities appreciated No significant rashes/lesions/ulcerations overlying the examined area. DP & PT pulses 2+/4.  No significant pretibial edema.  No clubbing or cyanosis  Overall improved range of motion while side lying with hip abduction.  She is  Markedly less tender than last visit.  Hip abduction strength is still 4 out of 5 with T FL  prominence.     ASSESSMENT & PLAN:   Problem List Items Addressed This Visit    Tendinopathy of right gluteus medius - Primary    She does seem to be improved from the bursitis standpoint.   \We discussed the options for biologic therapy with nitroglycerin protocol and she wishes to proceed.   She should continue with physical therapy  As well as with her home exercise program.       Relevant Medications   nitroGLYCERIN (NITRODUR - DOSED IN MG/24 HR) 0.2 mg/hr patch   Lumbar spondylosis    This has been an ongoing issue for her in the past.  Suspect some of this may be related to neurogenic deactivation of the hip abductor's.  Should improve with core conditioning program.       Greater trochanteric bursitis of right hip    Much less tender today on exam.  Feels this is likely secondary to the degenerative glute medius tendon and for hip abduction strength.   Should resolve with continued focused therapy.         Follow-up: Return in about 4 weeks (around 03/08/2017).   CMA/ATC served as Education administrator during this visit. History, Physical, and Plan performed by medical provider. Documentation and orders reviewed and attested to.      Megan Taylor, [facility] [facility] Physician

## 2017-02-08 NOTE — Therapy (Signed)
[facility] [address] [place], [place], [address] Phone: [phone]   Fax:  [phone]  Physical Therapy Treatment  Patient Details  Name: Megan Taylor MRN: [id number] Date of Birth: 1946-08-12 Referring Provider: Dr. Teresa Coombs  Encounter Date: 02/08/2017      PT End of Session - 02/08/17 1146    Visit Number 3   Number of Visits 12   Date for PT Re-Evaluation 03/11/17   Authorization Type Medicare   PT Start Time 0930   PT Stop Time 1010   PT Time Calculation (min) 40 min   Activity Tolerance Patient tolerated treatment well   Behavior During Therapy [facility] for tasks assessed/performed      Past Medical History:  Diagnosis Date  . Dry eyes    uses Restasis eye drops.  . Hip pain    "bursitis right hip"  . Melanoma ([place])   . Osteopenia    was on fosamax for awhile  . Syncope and collapse 10/01/2016  . Tremor   . Wolff-Parkinson-White (WPW) syndrome 1992   heart ablation    Past Surgical History:  Procedure Laterality Date  . BREAST BIOPSY  1975   benign lump  . BREAST CYST ASPIRATION     benign  . DILATION AND CURETTAGE OF UTERUS  1977   with BTL  . EXCISION/RELEASE BURSA HIP Right 02/17/2016   Procedure: RIGHT HIP BURSECTOMY GLUTEAL TENDON REPAIR;  Surgeon: Gaynelle Arabian, MD;  Location: [facility];  Service: Orthopedics;  Laterality: Right;  . heart ablation  1992   [place]"Wolfe-Parkinson white syndrome"  . MELANOMA EXCISION Right 06/2009   right ankle  . TONSILLECTOMY      There were no vitals filed for this visit.      Subjective Assessment - 02/08/17 0931    Subjective Sore on Friday and still a little sore today in both hips.  "But it's a good thing, I'm excited."  But hip is less painful.  Sleeping is getting better.   Limitations Sitting;House hold activities   Patient Stated Goals improve pain and strength, sleep better   Currently in Pain? No/denies   Pain Score 0-No pain                          [facility] Adult PT Treatment/Exercise - 02/08/17 0936      Knee/Hip Exercises: Aerobic   Recumbent Bike L2 x 6 min     Knee/Hip Exercises: Standing   Hip Abduction Both;5 reps;Knee straight   Abduction Limitations slight hip extension with end range "pulse" x 10   Wall Squat 10 reps;5 seconds   Wall Squat Limitations with red theraband   SLS RLE: tap back with LLE and manual cues to decrease trunk rotation followed by high LLE march x 10     Knee/Hip Exercises: Supine   Single Leg Bridge Right;10 reps  toe up   Other Supine Knee/Hip Exercises bridging on orange physioball with and without hamstring curl x 10 each     Knee/Hip Exercises: Sidelying   Hip ABduction Right;5 reps   Hip ABduction Limitations 3#; end range "pulse"      Manual Therapy   Manual Therapy Soft tissue mobilization;Myofascial release   Manual therapy comments instructed in self myofacial release with ball   Soft tissue mobilization Rt glut med/min, TFL   Myofascial Release Rolling technique for Rt ITB  PT Long Term Goals - 01/28/17 0903      PT LONG TERM GOAL #1   Title independent with HEP (03/11/17)   Time 6   Period Weeks   Status New     PT LONG TERM GOAL #2   Title report ability to sleep on Rt side without increase in pain (03/11/17)   Time 6   Period Weeks   Status New     PT LONG TERM GOAL #3   Title demonstrate at least 4/5 strength Rt hip for improved functional mobiltiy and decreased risk of reinjury (03/11/17)   Time 6   Period Weeks   Status New               Plan - 02/08/17 1146    Clinical Impression Statement Pt has responded well to PT so far, and is reporting only soreness with exercise and increased activity.  States sleeping is still uncomfortable but better.  Numbness appears to be along incision, and may not significantly improve.  Will continue to benefit from PT to maximize function.   PT  Treatment/Interventions ADLs/Self Care Home Management;Cryotherapy;Electrical Stimulation;Iontophoresis 4mg /ml Dexamethasone;Ultrasound;Moist Heat;Neuromuscular re-education;Therapeutic exercise;Therapeutic activities;Functional mobility training;Patient/family education;Manual techniques;Taping;Dry needling   PT Next Visit Plan add ER/IR to HEP, glut med strengthening   Consulted and Agree with Plan of Care Patient      Patient will benefit from skilled therapeutic intervention in order to improve the following deficits and impairments:  Pain, Decreased strength, Decreased mobility, Impaired flexibility  Visit Diagnosis: Pain in right hip  Muscle weakness (generalized)     Problem List Patient Active Problem List   Diagnosis Date Noted  . Greater trochanteric bursitis of right hip 01/27/2017  . Lumbar spondylosis 01/26/2017  . Syncope and collapse 10/01/2016  . Tendinopathy of right gluteus medius 02/17/2016  . METATARSALGIA 02/05/2009  . BUNION, LEFT FOOT 02/05/2009  . FOOT PAIN, BILATERAL 02/05/2009  . TALIPES CAVUS 02/05/2009      Laureen Abrahams, PT, DPT 02/08/17 11:49 AM    [facility] [address] [place], [place], [address] Phone: [phone]   Fax:  [phone]  Name: Megan Taylor MRN: [id number] Date of Birth: [birth year]

## 2017-02-08 NOTE — Patient Instructions (Signed)
   1. Using right leg only and pointing toes up, lift hips and keep level.  Hold 3-5 seconds.  Perform 10 reps.  2. Standing without a band, kick one leg out and slightly back leading with heel.  Hold at end an "pulse" 5-10 times.  Perform 5-10 times each side.

## 2017-02-11 ENCOUNTER — Ambulatory Visit (INDEPENDENT_AMBULATORY_CARE_PROVIDER_SITE_OTHER): Payer: Medicare Other | Admitting: Physical Therapy

## 2017-02-11 DIAGNOSIS — M25551 Pain in right hip: Secondary | ICD-10-CM | POA: Diagnosis not present

## 2017-02-11 DIAGNOSIS — M6281 Muscle weakness (generalized): Secondary | ICD-10-CM

## 2017-02-11 NOTE — Patient Instructions (Signed)
Achilles / Gastroc, Standing    Stand, right foot behind, heel on floor and turned slightly out, leg straight, forward leg bent. Move hips forward. Hold _20-30__ seconds. Repeat _2-3__ times per session. Do _1-2__ sessions per day.  Copyright  VHI. All rights reserved.    Achilles / Soleus, Standing    Stand, right foot behind, heel on floor and turned slightly out. Lower hips and bend knees. Hold _20-30__ seconds. Repeat _2-3__ times per session. Do _1-2__ sessions per day.  Copyright  VHI. All rights reserved.        Foot Arch Stretch: Plantar Fascia, Sitting    Sit on edge of chair. Place foot on top of frozen water bottle or tennis ball and gently roll foot forward and backward. Feel stretch in arch of foot. Roll for _20-30__ seconds. Repeat _2-3__ times per session. Do _1-2__ sessions per day.  Copyright  VHI. All rights reserved.

## 2017-02-11 NOTE — Therapy (Signed)
[facility] [address] [place], [place], [address] Phone: [phone]   Fax:  [phone]  Physical Therapy Treatment  Patient Details  Name: Megan Taylor MRN: [id number] Date of Birth: [birth year] Referring Provider: Dr. Teresa Coombs  Encounter Date: 02/11/2017      PT End of Session - 02/11/17 1012    Visit Number 4   Number of Visits 12   Date for PT Re-Evaluation 03/11/17   Authorization Type Medicare   PT Start Time 0932   PT Stop Time 1012   PT Time Calculation (min) 40 min   Activity Tolerance Patient tolerated treatment well   Behavior During Therapy [facility] for tasks assessed/performed      Past Medical History:  Diagnosis Date  . Dry eyes    uses Restasis eye drops.  . Hip pain    "bursitis right hip"  . Melanoma ([place])   . Osteopenia    was on fosamax for awhile  . Syncope and collapse 10/01/2016  . Tremor   . Wolff-Parkinson-White (WPW) syndrome 1992   heart ablation    Past Surgical History:  Procedure Laterality Date  . BREAST BIOPSY  1975   benign lump  . BREAST CYST ASPIRATION     benign  . DILATION AND CURETTAGE OF UTERUS  1977   with BTL  . EXCISION/RELEASE BURSA HIP Right 02/17/2016   Procedure: RIGHT HIP BURSECTOMY GLUTEAL TENDON REPAIR;  Surgeon: Gaynelle Arabian, MD;  [facility]: [facility];  Service: Orthopedics;  Laterality: Right;  . heart ablation  1992   [place]"Wolfe-Parkinson white syndrome"  . MELANOMA EXCISION Right 06/2009   right ankle  . TONSILLECTOMY      There were no vitals filed for this visit.      Subjective Assessment - 02/11/17 0937    Subjective Doing well; feels like she may have hurt her foot.  Started to feel a "cramp" in foot and foot is sore.  Hip is feeling good and sleeping better.   Patient Stated Goals improve pain and strength, sleep better   Currently in Pain? No/denies                         [facility] Adult PT Treatment/Exercise - 02/11/17 0939       Knee/Hip Exercises: Stretches   Gastroc Stretch Right;2 reps;30 seconds   Soleus Stretch Right;2 reps;30 seconds     Knee/Hip Exercises: Aerobic   Recumbent Bike L3 x 6 min     Knee/Hip Exercises: Standing   Lateral Step Up Both;2 sets;Hand Hold: 1;10 reps  onto BOSU; with contralateral hip abduction   Forward Step Up Right;2 sets;10 reps;Hand Hold: 1   Forward Step Up Limitations onto BOSU; with Lt hip flexion     Knee/Hip Exercises: Supine   Other Supine Knee/Hip Exercises prone er/ir x 10 reps on Rt with red theraband     Knee/Hip Exercises: Sidelying   Hip ABduction Right;10 reps   Hip ABduction Limitations 2#     Knee/Hip Exercises: Prone   Hip Extension Right;10 reps   Hip Extension Limitations 2#; with knee flexion   Straight Leg Raises Right;10 reps   Straight Leg Raises Limitations 2#                PT Education - 02/11/17 1012    Education provided Yes   Education Details added to HEP   Person(s) Educated Patient   Methods Explanation;Demonstration;Handout   Comprehension Verbalized understanding;Returned  demonstration             PT Long Term Goals - 02/11/17 1012      PT LONG TERM GOAL #1   Title independent with HEP (03/11/17)   Status On-going     PT LONG TERM GOAL #2   Title report ability to sleep on Rt side without increase in pain (03/11/17)   Status On-going     PT LONG TERM GOAL #3   Title demonstrate at least 4/5 strength Rt hip for improved functional mobiltiy and decreased risk of reinjury (03/11/17)   Status On-going               Plan - 02/11/17 1012    Clinical Impression Statement Pt continues to tolerate sessions well and reports improved function.  Pt states she may wake up and have some pain but repositions and pain quickly resolved.  Feel pt may be close to transition to home program, but will continue to monitor.   PT Treatment/Interventions ADLs/Self Care Home Management;Cryotherapy;Electrical  Stimulation;Iontophoresis 4mg /ml Dexamethasone;Ultrasound;Moist Heat;Neuromuscular re-education;Therapeutic exercise;Therapeutic activities;Functional mobility training;Patient/family education;Manual techniques;Taping;Dry needling   PT Next Visit Plan glut med strengthening, plan for transition to home program   Consulted and Agree with Plan of Care Patient      Patient will benefit from skilled therapeutic intervention in order to improve the following deficits and impairments:  Pain, Decreased strength, Decreased mobility, Impaired flexibility  Visit Diagnosis: Pain in right hip  Muscle weakness (generalized)     Problem List Patient Active Problem List   Diagnosis Date Noted  . Greater trochanteric bursitis of right hip 01/27/2017  . Lumbar spondylosis 01/26/2017  . Syncope and collapse 10/01/2016  . Tendinopathy of right gluteus medius 02/17/2016  . METATARSALGIA 02/05/2009  . BUNION, LEFT FOOT 02/05/2009  . FOOT PAIN, BILATERAL 02/05/2009  . TALIPES CAVUS 02/05/2009       Laureen Abrahams, PT, DPT 02/11/17 10:14 AM    [facility] [place], [place], [address] Phone: [phone]   Fax:  [phone]  Name: TONY FRISCIA MRN: [id number] Date of Birth: 02/28/1946

## 2017-02-15 ENCOUNTER — Ambulatory Visit (INDEPENDENT_AMBULATORY_CARE_PROVIDER_SITE_OTHER): Payer: Medicare Other | Admitting: Physical Therapy

## 2017-02-15 DIAGNOSIS — M25551 Pain in right hip: Secondary | ICD-10-CM | POA: Diagnosis not present

## 2017-02-15 DIAGNOSIS — M6281 Muscle weakness (generalized): Secondary | ICD-10-CM

## 2017-02-15 NOTE — Therapy (Signed)
[facility] [address] [place], [place], [address] Phone: [phone]   Fax:  [phone]  Physical Therapy Treatment  Patient Details  Name: Megan Taylor MRN: [id number] Date of Birth: 02-Oct-1946 Referring Provider: Dr. Teresa Coombs  Encounter Date: 02/15/2017      PT End of Session - 02/15/17 1119    Visit Number 5   Number of Visits 12   Date for PT Re-Evaluation 03/11/17   Authorization Type Medicare   PT Start Time 0935   PT Stop Time 0932   PT Time Calculation (min) 39 min   Activity Tolerance Patient tolerated treatment well;No increased pain   Behavior During Therapy [facility] for tasks assessed/performed      Past Medical History:  Diagnosis Date  . Dry eyes    uses Restasis eye drops.  . Hip pain    "bursitis right hip"  . Melanoma ([place])   . Osteopenia    was on fosamax for awhile  . Syncope and collapse 10/01/2016  . Tremor   . Wolff-Parkinson-White (WPW) syndrome 1992   heart ablation    Past Surgical History:  Procedure Laterality Date  . BREAST BIOPSY  1975   benign lump  . BREAST CYST ASPIRATION     benign  . DILATION AND CURETTAGE OF UTERUS  1977   with BTL  . EXCISION/RELEASE BURSA HIP Right 02/17/2016   Procedure: RIGHT HIP BURSECTOMY GLUTEAL TENDON REPAIR;  Surgeon: Gaynelle Arabian, MD;  Location: [facility];  Service: Orthopedics;  Laterality: Right;  . heart ablation  1992   [place]"Wolfe-Parkinson white syndrome"  . MELANOMA EXCISION Right 06/2009   right ankle  . TONSILLECTOMY      There were no vitals filed for this visit.      Subjective Assessment - 02/15/17 0938    Subjective had a little pain Friday night, but otherwise she's doing well.  pain has improved.   Patient Stated Goals improve pain and strength, sleep better   Currently in Pain? No/denies                         [facility] Adult PT Treatment/Exercise - 02/15/17 0940      Knee/Hip Exercises: Stretches   Piriformis Stretch Right;2 reps;30 seconds     Knee/Hip Exercises: Aerobic   Recumbent Bike L3 x 6 min     Knee/Hip Exercises: Standing   Hip Abduction Both;Knee straight;10 reps   Abduction Limitations slight hip extension; with "pulse" at end range x 5 reps bil   Hip Extension Both;10 reps;Knee straight   Extension Limitations with red theraband     Knee/Hip Exercises: Seated   Other Seated Knee/Hip Exercises hip er/ir with red theraband x 10 reps bil     Knee/Hip Exercises: Supine   Bridges Both;10 reps   Bridges Limitations with red theraband holding isometric abdct/er   Other Supine Knee/Hip Exercises bridging on orange physioball with and without hamstring curl x 10 each                PT Education - 02/15/17 1119    Education provided Yes   Education Details hip internal rotation/external rotation   Person(s) Educated Patient   Methods Explanation;Demonstration;Handout   Comprehension Verbalized understanding;Returned demonstration             PT Long Term Goals - 02/15/17 1119      PT LONG TERM GOAL #1   Title independent with HEP (03/11/17)  Baseline 02/15/17: independent with HEP to date   Status Achieved     PT LONG TERM GOAL #2   Title report ability to sleep on Rt side without increase in pain (03/11/17)   Baseline 02/15/17: improved pain and now able to fall asleep on Rt side; reports she does reposition throughout night   Status Partially Met     PT LONG TERM GOAL #3   Title demonstrate at least 4/5 strength Rt hip for improved functional mobiltiy and decreased risk of reinjury (03/11/17)   Status On-going               Plan - 02/15/17 1120    Clinical Impression Statement Pt is doing really well with PT and has demonstrated improved functional progress.  Pt reports minimal pain and increased ability to sleep on Rt side.  Anticipate pt will be ready for d/c or hold next 1-2 sessions.  Pt has extensive HEP and is very compliant with at this  time.   PT Treatment/Interventions ADLs/Self Care Home Management;Cryotherapy;Electrical Stimulation;Iontophoresis '4mg'$ /ml Dexamethasone;Ultrasound;Moist Heat;Neuromuscular re-education;Therapeutic exercise;Therapeutic activities;Functional mobility training;Patient/family education;Manual techniques;Taping;Dry needling   PT Next Visit Plan glut med strengthening, plan for transition to home program; address any concerns, check strength      Patient will benefit from skilled therapeutic intervention in order to improve the following deficits and impairments:  Pain, Decreased strength, Decreased mobility, Impaired flexibility  Visit Diagnosis: Pain in right hip  Muscle weakness (generalized)     Problem List Patient Active Problem List   Diagnosis Date Noted  . Greater trochanteric bursitis of right hip 01/27/2017  . Lumbar spondylosis 01/26/2017  . Syncope and collapse 10/01/2016  . Tendinopathy of right gluteus medius 02/17/2016  . METATARSALGIA 02/05/2009  . BUNION, LEFT FOOT 02/05/2009  . FOOT PAIN, BILATERAL 02/05/2009  . TALIPES CAVUS 02/05/2009     Laureen Abrahams, PT, DPT 02/15/17 11:22 AM    [facility] [place], [place], [address] Phone: [phone]   Fax:  [phone]  Name: DINITA MIGLIACCIO MRN: [id number] Date of Birth: 11/07/1945

## 2017-02-15 NOTE — Patient Instructions (Signed)
Hip Internal Rotation: Resisted    Sit with band loop around right ankle, anchor on other side. Keeping thigh flat and knee bent at right angle, pull ankle away from body. Repeat __10-15__ times per set. Do __1__ sets per session. Do _1-2___ sessions per day.  http://orth.exer.us/698   Copyright  VHI. All rights reserved.    Hip External Rotation: Resisted    Sit with band loop around left ankle, anchor on same side. Keeping thigh flat and knee bent at right angle, pull ankle across body. Repeat __10-15__ times per set. Do __1__ sets per session. Do _1-2___ sessions per day.  http://orth.exer.us/696   Copyright  VHI. All rights reserved.

## 2017-02-18 ENCOUNTER — Ambulatory Visit (INDEPENDENT_AMBULATORY_CARE_PROVIDER_SITE_OTHER): Payer: Medicare Other | Admitting: Physical Therapy

## 2017-02-18 DIAGNOSIS — M25551 Pain in right hip: Secondary | ICD-10-CM | POA: Diagnosis not present

## 2017-02-18 DIAGNOSIS — M6281 Muscle weakness (generalized): Secondary | ICD-10-CM

## 2017-02-18 NOTE — Therapy (Signed)
[facility], [place], [address] Phone: [phone]   Fax:  [phone]  Physical Therapy Treatment/Discharge  Patient Details  Name: Megan Taylor MRN: [id number] Date of Birth: Apr 07, 1946 Referring Provider: Dr. Teresa Coombs  Encounter Date: 02/18/2017      PT End of Session - 02/18/17 1007    Visit Number 6   Date for PT Re-Evaluation 03/11/17   Authorization Type Medicare   PT Start Time 0934   PT Stop Time 1005   PT Time Calculation (min) 31 min   Activity Tolerance Patient tolerated treatment well   Behavior During Therapy [facility] for tasks assessed/performed      Past Medical History:  Diagnosis Date  . Dry eyes    uses Restasis eye drops.  . Hip pain    "bursitis right hip"  . Melanoma ([place])   . Osteopenia    was on fosamax for awhile  . Syncope and collapse 10/01/2016  . Tremor   . Wolff-Parkinson-White (WPW) syndrome 1992   heart ablation    Past Surgical History:  Procedure Laterality Date  . BREAST BIOPSY  1975   benign lump  . BREAST CYST ASPIRATION     benign  . DILATION AND CURETTAGE OF UTERUS  1977   with BTL  . EXCISION/RELEASE BURSA HIP Right 02/17/2016   Procedure: RIGHT HIP BURSECTOMY GLUTEAL TENDON REPAIR;  Surgeon: Gaynelle Arabian, MD;  Location: [facility];  Service: Orthopedics;  Laterality: Right;  . heart ablation  1992   [place]"Wolfe-Parkinson white syndrome"  . MELANOMA EXCISION Right 06/2009   right ankle  . TONSILLECTOMY      There were no vitals filed for this visit.      Subjective Assessment - 02/18/17 0940    Subjective doing well; sat on brick steps monday night and had a lot of pain but resolved quickly.  no trouble sitting on cushion though.   Patient Stated Goals improve pain and strength, sleep better   Currently in Pain? No/denies            [facility] PT Assessment - 02/18/17 0943      Strength   Right Hip Flexion 4/5   Right Hip Extension 5/5   Right  Hip External Rotation  3+/5   Right Hip Internal Rotation 4+/5   Right Hip ABduction 4+/5   Right Hip ADduction 4/5   Left Hip Flexion 5/5   Left Hip Extension 5/5   Left Hip External Rotation 4/5   Left Hip Internal Rotation 5/5   Left Hip ABduction 5/5   Left Hip ADduction 4/5                     [facility] Adult PT Treatment/Exercise - 02/18/17 0942      Self-Care   Self-Care Other Self-Care Comments   Other Self-Care Comments  educated on community fitness options, recommendation to focus on low weight/higher reps and especially focusing on form.  Educated on importance of maintaining correct form with all exercises to decrease risk of reinjury and targets to specific muscles. Recommended continued HEP even if pt going to personal trainer.     Knee/Hip Exercises: Stretches   Piriformis Stretch Right;2 reps;30 seconds   Other Knee/Hip Stretches ITB stretch 2x30 sec right     Knee/Hip Exercises: Aerobic   Recumbent Bike L3 x 6 min     Knee/Hip Exercises: Standing   SLS RLE SLS activities with Lt hip hike for glut  med activation; demonstrated options for HEP.                     PT Long Term Goals - 02/18/17 1007      PT LONG TERM GOAL #1   Title independent with HEP (03/11/17)   Baseline 02/15/17: independent with HEP to date   Status Achieved     PT LONG TERM GOAL #2   Title report ability to sleep on Rt side without increase in pain (03/11/17)   Baseline 02/18/17: pt reports no trouble x 3 days   Status Achieved     PT LONG TERM GOAL #3   Title demonstrate at least 4/5 strength Rt hip for improved functional mobiltiy and decreased risk of reinjury (03/11/17)   Baseline 02/18/17: all met except external rotation still 3+/5; has HEP to perform   Status Achieved               Plan - 02/18/17 1008    Clinical Impression Statement Pt has met all goals and is ready for d/c.  Plans to see personal trainer at [facility].T. by Dedra Skeens; and recommended pt  continue with HEP as this is specific to injury.  Discussed safe progression to community fitness and pt verbalized understanding.     PT Treatment/Interventions ADLs/Self Care Home Management;Cryotherapy;Electrical Stimulation;Iontophoresis '4mg'$ /ml Dexamethasone;Ultrasound;Moist Heat;Neuromuscular re-education;Therapeutic exercise;Therapeutic activities;Functional mobility training;Patient/family education;Manual techniques;Taping;Dry needling   PT Next Visit Plan d/c PT today   Consulted and Agree with Plan of Care Patient      Patient will benefit from skilled therapeutic intervention in order to improve the following deficits and impairments:  Pain, Decreased strength, Decreased mobility, Impaired flexibility  Visit Diagnosis: Pain in right hip  Muscle weakness (generalized)     Problem List Patient Active Problem List   Diagnosis Date Noted  . Greater trochanteric bursitis of right hip 01/27/2017  . Lumbar spondylosis 01/26/2017  . Syncope and collapse 10/01/2016  . Tendinopathy of right gluteus medius 02/17/2016  . METATARSALGIA 02/05/2009  . BUNION, LEFT FOOT 02/05/2009  . FOOT PAIN, BILATERAL 02/05/2009  . TALIPES CAVUS 02/05/2009      Laureen Abrahams, PT, DPT 02/18/17 10:11 AM    [facility], [place], [address] Phone: [phone]   Fax:  [phone]  Name: Megan Taylor MRN: [id number] Date of Birth: Dec 25, 1945       PHYSICAL THERAPY DISCHARGE SUMMARY  Visits from Start of Care: 6  Current functional level related to goals / functional outcomes: See above   Remaining deficits: n/a; all goals met   Education / Equipment: HEP, community fitness  Plan: Patient agrees to discharge.  Patient goals were met. Patient is being discharged due to meeting the stated rehab goals.  ?????     Laureen Abrahams, PT, DPT 02/18/17 10:12 AM

## 2017-03-08 NOTE — Assessment & Plan Note (Signed)
She does seem to be improved from the bursitis standpoint.   \We discussed the options for biologic therapy with nitroglycerin protocol and she wishes to proceed.   She should continue with physical therapy  As well as with her home exercise program.

## 2017-03-08 NOTE — Assessment & Plan Note (Signed)
This has been an ongoing issue for her in the past.  Suspect some of this may be related to neurogenic deactivation of the hip abductor's.  Should improve with core conditioning program.

## 2017-03-08 NOTE — Assessment & Plan Note (Signed)
Much less tender today on exam.  Feels this is likely secondary to the degenerative glute medius tendon and for hip abduction strength.   Should resolve with continued focused therapy.

## 2017-03-10 ENCOUNTER — Encounter: Payer: Self-pay | Admitting: Sports Medicine

## 2017-03-10 ENCOUNTER — Ambulatory Visit: Payer: Self-pay

## 2017-03-10 ENCOUNTER — Ambulatory Visit (INDEPENDENT_AMBULATORY_CARE_PROVIDER_SITE_OTHER): Payer: Medicare Other | Admitting: Sports Medicine

## 2017-03-10 VITALS — BP 100/70 | HR 81 | Ht 63.5 in | Wt 107.4 lb

## 2017-03-10 DIAGNOSIS — M25551 Pain in right hip: Secondary | ICD-10-CM

## 2017-03-10 DIAGNOSIS — M67951 Unspecified disorder of synovium and tendon, right thigh: Secondary | ICD-10-CM

## 2017-03-10 DIAGNOSIS — M47816 Spondylosis without myelopathy or radiculopathy, lumbar region: Secondary | ICD-10-CM

## 2017-03-10 DIAGNOSIS — M7061 Trochanteric bursitis, right hip: Secondary | ICD-10-CM | POA: Diagnosis not present

## 2017-03-10 DIAGNOSIS — M6798 Unspecified disorder of synovium and tendon, other site: Secondary | ICD-10-CM | POA: Diagnosis not present

## 2017-03-10 NOTE — Progress Notes (Signed)
OFFICE VISIT NOTE [name]. Ellayna Hilligoss, [place] at [facility] [id number]  MISCHELL BRANFORD - 71 y.o. female MRN [id number]  Date of birth: [birth year]  Visit Date: 03/10/2017  PCP: Jonathon Jordan, MD   Referred by: Jonathon Jordan, MD  Burlene Arnt, CMA acting as scribe for Dr. Paulla Fore.  SUBJECTIVE:   Chief Complaint  Patient presents with  . Follow-up    pain in right hip   HPI: As below and per problem based documentation when appropriate.  Pt presents today in follow-up of right hip pain.  Pt started a couple of years ago but she had hip surgery Feb 17 2016 and a procedure on her back 11/2016. Pain has improved since her last visit until last night.   The pain is described as aching and is rated as 0/10 currently but 9/10 last night.  Worsened with laying in bed, pain is mostly present when sleeping. Pt reports a stinging sensation at the site of her incision.  Nothing seems to alleviate the pain.  Therapies tried include : PT, Nitroglycerin protocol, home exercises. Pt had steroid injection 01/26/17. Pt got some relief with these therapies.   Other associated symptoms include: some pain radiates into the right thigh. The right thigh is slightly tender to palpation.   Pt denies fever, chills, night sweats, unintentional weight gain or weight loss.     Review of Systems  Constitutional: Negative for chills, fever and weight loss.  Respiratory: Negative for shortness of breath and wheezing.   Cardiovascular: Negative for chest pain, palpitations and leg swelling.  Musculoskeletal: Negative for falls.  Neurological: Negative for dizziness, tingling and headaches.  Endo/Heme/Allergies: Does not bruise/bleed easily.    Otherwise per HPI.  HISTORY & PERTINENT PRIOR DATA:  No specialty comments available. She reports that she has never smoked. She has never used smokeless tobacco. No results for input(s): HGBA1C, LABURIC in the last 8760  hours. Medications & Allergies reviewed per EMR Patient Active Problem List   Diagnosis Date Noted  . Greater trochanteric bursitis of right hip 01/27/2017  . Lumbar spondylosis 01/26/2017  . Syncope and collapse 10/01/2016  . Tendinopathy of right gluteus medius 02/17/2016  . METATARSALGIA 02/05/2009  . BUNION, LEFT FOOT 02/05/2009  . FOOT PAIN, BILATERAL 02/05/2009  . TALIPES CAVUS 02/05/2009   Past Medical History:  Diagnosis Date  . Dry eyes    uses Restasis eye drops.  . Hip pain    "bursitis right hip"  . Melanoma ([place])   . Osteopenia    was on fosamax for awhile  . Syncope and collapse 10/01/2016  . Tremor   . Wolff-Parkinson-White (WPW) syndrome 1992   heart ablation   Family History  Problem Relation Age of Onset  . Lung cancer Father 66  . Hypertension Mother   . Hyperlipidemia Mother   . Congestive Heart Failure Mother   . Osteoporosis Sister 71  . Osteoporosis Sister   . Hodgkin's lymphoma Paternal Grandfather   . Hypertension Maternal Grandmother   . Hyperlipidemia Maternal Grandmother   . Congestive Heart Failure Paternal Grandmother   . Tremor Daughter    Past Surgical History:  Procedure Laterality Date  . BREAST BIOPSY  1975   benign lump  . BREAST CYST ASPIRATION     benign  . DILATION AND CURETTAGE OF UTERUS  1977   with BTL  . EXCISION/RELEASE BURSA HIP Right 02/17/2016   Procedure: RIGHT HIP BURSECTOMY GLUTEAL TENDON REPAIR;  Surgeon: Gaynelle Arabian, MD;  Location: [facility];  Service: Orthopedics;  Laterality: Right;  . heart ablation  1992   [place]"Wolfe-Parkinson white syndrome"  . MELANOMA EXCISION Right 06/2009   right ankle  . TONSILLECTOMY     Social History   Occupational History  . Retired    Social History Main Topics  . Smoking status: Never Smoker  . Smokeless tobacco: Never Used  . Alcohol use 3.0 - 3.6 oz/week    5 - 6 Standard drinks or equivalent per week     Comment: wine  . Drug use: No  . Sexual activity: Yes     Partners: Male    Birth control/ protection: Post-menopausal    OBJECTIVE:  VS:  HT:5' 3.5" (161.3 cm)   WT:107 lb 6.4 oz (48.7 kg)  BMI:18.8    BP:100/70  HR:81bpm  TEMP: ( )  RESP:96 % EXAM: Findings:  WDWN, NAD, Non-toxic appearing Alert & appropriately interactive Not depressed or anxious appearing No increased work of breathing. Pupils are equal. EOM intact without nystagmus No clubbing or cyanosis of the extremities appreciated No significant rashes/lesions/ulcerations overlying the examined area. DP & PT pulses 2+/4.  No significant pretibial edema. Sensation intact to light touch in lower extremities.  Right hip: Overall well aligned.  She has good internal/external rotation of the hip.  She has persistent pain over the piriformis and glute medius musculature as well as at the greater trochanter but is significantly improved.  Her hip abduction strength is still 4 out of 5 but this is stronger than in the past.  Postsurgical incision is well-healed without evidence of surrounding erythema or exudate.  ++++++++++++++++++++++++++++++++++++++++++++++++++++++++++++++++++ LIMITED MSK ULTRASOUND OF right hip Images were obtained and interpreted by myself, Teresa Coombs, DO  Images have been saved and stored to PACS system. Images obtained on: GE S7 Ultrasound machine  FINDINGS:  Marked tendinopathy changes of the piriformis and glute medius insertion.  She has overlying hypoechoic change that is directly deep to the surgical incision site has no significant surrounding neovascularity or hyperemia.  Interstitial tendon neovascularity is minimal  IMPRESSION:  Chronic glute medius and piriformis tendinopathy in the setting of chronic bursitis versus postsurgical seroma.     No results found. ASSESSMENT & PLAN:   Problem List Items Addressed This Visit    Tendinopathy of right gluteus medius    Patient does have hypoechoic change superficial to her greater trochanter  directly deep to the incision site from her bursectomy.  this could possibly represent a postoperative seroma that is small and is yet to resolve.  Previously injected with good improvement in the size however still persistent today.  Recommend compression with Body Helix hip sleeve to be worn during activity but also as needed for comfort.  Continue the nitroglycerin protocol for her to neuropathic changes which are likely the underlying issue for her.      Lumbar spondylosis    Overall her back seems to be significantly better and she is going to begin again on a core conditioning program with a personal trainer that her husband also uses.  Cautioned on dynamic type activities as well as any type of hyperextension motions.      Greater trochanteric bursitis of right hip    Once again question whether recurrent bursa or positive seroma on ultrasound.  Not amenable to aspiration but this could be considered if persistent. No evidence or concerns for infection given lack of inflammatory component and no significant skin changes or drainage.  She should improve with the above treatment including compression.       Other Visit Diagnoses    Pain in right hip    -  Primary   Relevant Orders   [place] LIMITED JOINT SPACE STRUCTURES LOW RIGHT(NO LINKED CHARGES)      Follow-up: Return in about 6 weeks (around 04/21/2017).   CMA/ATC served as Education administrator during this visit. History, Physical, and Plan performed by medical provider. Documentation and orders reviewed and attested to.      Teresa Coombs, [place] [facility] Physician

## 2017-03-10 NOTE — Patient Instructions (Signed)
I recommend you obtained a compression sleeve to help with your joint problems. There are many options on the market however I recommend obtaining a ADJUSTABLE GROIN Body Helix compression sleeve.  You can find information (including how to appropriate measure yourself for sizing) can be found at www.Body http://www.lambert.com/.  Many of these products are health savings account (HSA) eligible.   You can use the compression sleeve at any time throughout the day but is most important to use while being active as well as for 2 hours post-activity.   It is appropriate to ice following activity with the compression sleeve in place.

## 2017-03-14 NOTE — Assessment & Plan Note (Signed)
Patient does have hypoechoic change superficial to her greater trochanter directly deep to the incision site from her bursectomy.  this could possibly represent a postoperative seroma that is small and is yet to resolve.  Previously injected with good improvement in the size however still persistent today.  Recommend compression with Body Helix hip sleeve to be worn during activity but also as needed for comfort.  Continue the nitroglycerin protocol for her to neuropathic changes which are likely the underlying issue for her.

## 2017-03-14 NOTE — Assessment & Plan Note (Signed)
Once again question whether recurrent bursa or positive seroma on ultrasound.  Not amenable to aspiration but this could be considered if persistent. No evidence or concerns for infection given lack of inflammatory component and no significant skin changes or drainage.  She should improve with the above treatment including compression.

## 2017-03-14 NOTE — Assessment & Plan Note (Signed)
Overall her back seems to be significantly better and she is going to begin again on a core conditioning program with a personal trainer that her husband also uses.  Cautioned on dynamic type activities as well as any type of hyperextension motions.

## 2017-04-21 ENCOUNTER — Encounter: Payer: Self-pay | Admitting: Sports Medicine

## 2017-04-21 ENCOUNTER — Ambulatory Visit: Payer: Self-pay

## 2017-04-21 ENCOUNTER — Ambulatory Visit (INDEPENDENT_AMBULATORY_CARE_PROVIDER_SITE_OTHER): Payer: Medicare Other | Admitting: Sports Medicine

## 2017-04-21 VITALS — BP 102/66 | HR 66 | Ht 63.5 in | Wt 104.8 lb

## 2017-04-21 DIAGNOSIS — M96842 Postprocedural seroma of a musculoskeletal structure following a musculoskeletal system procedure: Secondary | ICD-10-CM | POA: Insufficient documentation

## 2017-04-21 DIAGNOSIS — M47816 Spondylosis without myelopathy or radiculopathy, lumbar region: Secondary | ICD-10-CM | POA: Diagnosis not present

## 2017-04-21 DIAGNOSIS — M6798 Unspecified disorder of synovium and tendon, other site: Secondary | ICD-10-CM | POA: Diagnosis not present

## 2017-04-21 DIAGNOSIS — M67951 Unspecified disorder of synovium and tendon, right thigh: Secondary | ICD-10-CM

## 2017-04-21 NOTE — Progress Notes (Signed)
OFFICE VISIT NOTE Megan Taylor. Megan Taylor, Megan Taylor at [facility] [id number]  Megan Taylor - 71 y.o. female MRN [id number]  Date of birth: 05-06-1946  Visit Date: 04/21/2017  PCP: Jonathon Jordan, MD   Referred by: Jonathon Jordan, MD  Burlene Arnt, CMA acting as scribe for Dr. Paulla Fore.  SUBJECTIVE:   Chief Complaint  Patient presents with  . Follow-up    right hip pain   HPI: As below and per problem based documentation when appropriate.  Pt presents today in follow-up of right hip pain. She had u/s done 03/10/17 which showed the following:L IMPRESSION:  Chronic glute medius and piriformis tendinopathy in the setting of chronic bursitis versus postsurgical seroma. She was advised to wear a Body Helix hip compression sleeve during activity and as needed for therapeutic purposes. She was also to continue following the Nitro Protocol.  In regards to her lower back pain she was advised to continue core strengthening exercises.   Pt got the Body Helix sleeve but says that she couldn't find it in a size that was small enough for her so she has been wearing Spanx and tight yoga pants. She reports that she is having some relief with the compression she receives by doing this. She is still following Nitro Protocol. She reports that her hip pain is "much better" overall since her last visit. She still has a little bit of pain but its not keeping her up at night anymore. Some nights she can even sleep on the right side which she hasn't been able to do for years. Most of the time she only has pain when she over does it.  Pt reports that lower back pain has improved. She has been seeing a Physiological scientist a couple of times a week and doing core strengthening exercises.   Pt denies fever, chills, night sweats.     Review of Systems  Constitutional: Negative for chills and fever.  Respiratory: Negative for shortness of breath and wheezing.     Cardiovascular: Negative for chest pain, palpitations and leg swelling.  Musculoskeletal: Negative for falls.  Neurological: Negative for dizziness, tingling and headaches.  Endo/Heme/Allergies: Does not bruise/bleed easily.    Otherwise per HPI.  HISTORY & PERTINENT PRIOR DATA:  No specialty comments available. She reports that she has never smoked. She has never used smokeless tobacco. No results for input(s): HGBA1C, LABURIC in the last 8760 hours. Medications & Allergies reviewed per EMR Patient Active Problem List   Diagnosis Date Noted  . Seroma of musculoskeletal structure after musculoskeletal system procedure 04/21/2017  . Greater trochanteric bursitis of right hip 01/27/2017  . Lumbar spondylosis 01/26/2017  . Syncope and collapse 10/01/2016  . Tendinopathy of right gluteus medius 02/17/2016  . METATARSALGIA 02/05/2009  . BUNION, LEFT FOOT 02/05/2009  . FOOT PAIN, BILATERAL 02/05/2009  . TALIPES CAVUS 02/05/2009   Past Medical History:  Diagnosis Date  . Dry eyes    uses Restasis eye drops.  . Hip pain    "bursitis right hip"  . Melanoma ([place])   . Osteopenia    was on fosamax for awhile  . Syncope and collapse 10/01/2016  . Tremor   . Wolff-Parkinson-White (WPW) syndrome 1992   heart ablation   Family History  Problem Relation Age of Onset  . Lung cancer Father 18  . Hypertension Mother   . Hyperlipidemia Mother   . Congestive Heart Failure Mother   . Osteoporosis Sister 79  .  Osteoporosis Sister   . Hodgkin's lymphoma Paternal Grandfather   . Hypertension Maternal Grandmother   . Hyperlipidemia Maternal Grandmother   . Congestive Heart Failure Paternal Grandmother   . Tremor Daughter    Past Surgical History:  Procedure Laterality Date  . BREAST BIOPSY  1975   benign lump  . BREAST CYST ASPIRATION     benign  . DILATION AND CURETTAGE OF UTERUS  1977   with BTL  . EXCISION/RELEASE BURSA HIP Right 02/17/2016   Procedure: RIGHT HIP BURSECTOMY GLUTEAL  TENDON REPAIR;  Surgeon: Gaynelle Arabian, MD;  Location: [facility];  Service: Orthopedics;  Laterality: Right;  . heart ablation  1992   [place]"Wolfe-Parkinson white syndrome"  . MELANOMA EXCISION Right 06/2009   right ankle  . TONSILLECTOMY     Social History   Occupational History  . Retired    Social History Main Topics  . Smoking status: Never Smoker  . Smokeless tobacco: Never Used  . Alcohol use 3.0 - 3.6 oz/week    5 - 6 Standard drinks or equivalent per week     Comment: wine  . Drug use: No  . Sexual activity: Yes    Partners: Male    Birth control/ protection: Post-menopausal    OBJECTIVE:  VS:  HT:5' 3.5" (161.3 cm)   WT:104 lb 12.8 oz (47.5 kg)  BMI:18.3    BP:102/66  HR:66bpm  TEMP: ( )  RESP:99 % EXAM: Findings:  Adult female.  No acute distress.  Alert and appropriate. Bilateral lower extremities overall well aligned.  She walks with improved gait Right greater trochanter is mildly sore but no focal TTP which is markedly improved.  No significant pain over the glute medius musculature.  She is improved hip abduction recruitment on glute medius.     No results found. ASSESSMENT & PLAN:     ICD-10-CM   1. Tendinopathy of right gluteus medius M67.98 [place] LIMITED JOINT SPACE STRUCTURES LOW RIGHT(NO LINKED CHARGES)  2. Lumbar spondylosis M47.816   3. Seroma of musculoskeletal structure after musculoskeletal system procedure M96.842   ================================================================= Tendinopathy of right gluteus medius Overall great improvement from her last visit she is essentially pain-free from day-to-day standpoint does noticed persistent soreness.  She does have some remaining changes ultrasound like her to continue with exercises and treatment protocol.  Continue with Body Helix hip spica.  We will plan to check in with her in 10 weeks to ensure clinical resolution and consider discontinuing nitroglycerin protocol at that time.  Seroma of  musculoskeletal structure after musculoskeletal system procedure There is a noted seroma deep to the surgical incision. Continue compression icing and consider repeat injection into the site if symptoms return but given she is essentially asymptomatic no further evaluation needed at this time. ================================================================= There are no Patient Instructions on file for this visit.=================================================================  Follow-up: Return for repeat clinical exam, repeat diagnostic ultrasound.   CMA/ATC served as Education administrator during this visit. History, Physical, and Plan performed by medical provider. Documentation and orders reviewed and attested to.      Teresa Coombs, [place] [facility] Physician

## 2017-05-26 ENCOUNTER — Ambulatory Visit: Payer: Medicare Other | Admitting: Nurse Practitioner

## 2017-05-27 NOTE — Progress Notes (Deleted)
71 y.o. G74P0002 Married Caucasian female here for annual exam.    PCP:     Patient's last menstrual period was 10/12/1997.           Sexually active: {yes no:314532}  The current method of family planning is post menopausal status.    Exercising: {yes no:314532}  {types:19826} Smoker:  no  Health Maintenance: Pap: 05-25-16 Neg; 05-19-13 Neg:Neg History of abnormal Pap:  {YES NO:22349} MMG: 10-08-16 Density B/Bx site marker in Lt.Br./Neg/BiRads2:TBC Colonoscopy: 10/2014- Normal f/u in 10 years (2026) BMD:  10-08-16   Result:Osteopenia:TBC TDaP: *** Gardasil:   no HIV: *** Hep C: 05-25-16 Neg Screening Labs:  Hb today: ***, Urine today: ***   reports that she has never smoked. She has never used smokeless tobacco. She reports that she drinks about 3.0 - 3.6 oz of alcohol per week . She reports that she does not use drugs.  Past Medical History:  Diagnosis Date  . Dry eyes    uses Restasis eye drops.  . Hip pain    "bursitis right hip"  . Melanoma ([place])   . Osteopenia    was on fosamax for awhile  . Syncope and collapse 10/01/2016  . Tremor   . Wolff-Parkinson-White (WPW) syndrome 1992   heart ablation    Past Surgical History:  Procedure Laterality Date  . BREAST BIOPSY  1975   benign lump  . BREAST CYST ASPIRATION     benign  . DILATION AND CURETTAGE OF UTERUS  1977   with BTL  . EXCISION/RELEASE BURSA HIP Right 02/17/2016   Procedure: RIGHT HIP BURSECTOMY GLUTEAL TENDON REPAIR;  Surgeon: Gaynelle Arabian, MD;  Location: [facility];  Service: Orthopedics;  Laterality: Right;  . heart ablation  1992   [place]"Wolfe-Parkinson white syndrome"  . MELANOMA EXCISION Right 06/2009   right ankle  . TONSILLECTOMY      Current Outpatient Prescriptions  Medication Sig Dispense Refill  . acetaminophen (TYLENOL) 325 MG tablet Take 650 mg by mouth as needed.    . Ascorbic Acid Buffered (BUFFERED VITAMIN C) 1000 MG CAPS Take 1,000 mg by mouth daily.    Marland Kitchen aspirin 81 MG tablet Take 81 mg  by mouth daily.    . B Complex Vitamins (B COMPLEX PO) Take 1 tablet by mouth daily.    . calcium carbonate (OS-CAL) 600 MG tablet Take 600 mg by mouth daily.    . Cholecalciferol (VITAMIN D) 2000 UNITS CAPS Take 2,000 Units by mouth daily.     . cycloSPORINE (RESTASIS) 0.05 % ophthalmic emulsion Place 1 drop into both eyes 2 (two) times daily.    . fish oil-omega-3 fatty acids 1000 MG capsule Take 1 g by mouth 2 (two) times daily.     Marland Kitchen ibuprofen (ADVIL,MOTRIN) 200 MG tablet Take 200 mg by mouth as needed.    . nitroGLYCERIN (NITRODUR - DOSED IN MG/24 HR) 0.2 mg/hr patch Place 1/4 of patch over affected region. Remove and replace once daily.  Slightly alter skin placement daily 30 patch 1  . Probiotic Product (PROBIOTIC DAILY PO) Take 1 capsule by mouth daily.      No current facility-administered medications for this visit.     Family History  Problem Relation Age of Onset  . Lung cancer Father 11  . Hypertension Mother   . Hyperlipidemia Mother   . Congestive Heart Failure Mother   . Osteoporosis Sister 24  . Osteoporosis Sister   . Hodgkin's lymphoma Paternal Grandfather   . Hypertension Maternal  Grandmother   . Hyperlipidemia Maternal Grandmother   . Congestive Heart Failure Paternal Grandmother   . Tremor Daughter     ROS:  Pertinent items are noted in HPI.  Otherwise, a comprehensive ROS was negative.  Exam:   LMP 10/12/1997     General appearance: alert, cooperative and appears stated age Head: Normocephalic, without obvious abnormality, atraumatic Neck: no adenopathy, supple, symmetrical, trachea midline and thyroid normal to inspection and palpation Lungs: clear to auscultation bilaterally Breasts: normal appearance, no masses or tenderness, No nipple retraction or dimpling, No nipple discharge or bleeding, No axillary or supraclavicular adenopathy Heart: regular rate and rhythm Abdomen: soft, non-tender; no masses, no organomegaly Extremities: extremities normal,  atraumatic, no cyanosis or edema Skin: Skin color, texture, turgor normal. No rashes or lesions Lymph nodes: Cervical, supraclavicular, and axillary nodes normal. No abnormal inguinal nodes palpated Neurologic: Grossly normal  Pelvic: External genitalia:  no lesions              Urethra:  normal appearing urethra with no masses, tenderness or lesions              Bartholins and Skenes: normal                 Vagina: normal appearing vagina with normal color and discharge, no lesions              Cervix: no lesions              Pap taken: {yes no:314532} Bimanual Exam:  Uterus:  normal size, contour, position, consistency, mobility, non-tender              Adnexa: no mass, fullness, tenderness              Rectal exam: {yes no:314532}.  Confirms.              Anus:  normal sphincter tone, no lesions  Chaperone was present for exam.  Assessment:   Well woman visit with normal exam.   Plan: Mammogram screening discussed. Recommended self breast awareness. Pap and HR HPV as above. Guidelines for Calcium, Vitamin D, regular exercise program including cardiovascular and weight bearing exercise.   Follow up annually and prn.   Additional counseling given.  {yes [id number]. _______ minutes face to face time of which over 50% was spent in counseling.    After visit summary provided.

## 2017-05-28 ENCOUNTER — Ambulatory Visit: Payer: Medicare Other | Admitting: Obstetrics and Gynecology

## 2017-05-28 ENCOUNTER — Telehealth: Payer: Self-pay | Admitting: Obstetrics and Gynecology

## 2017-05-28 NOTE — Telephone Encounter (Signed)
Left message regarding upcoming appointment 05/28/17 has been canceled and needs to be rescheduled.

## 2017-05-31 NOTE — Assessment & Plan Note (Signed)
There is a noted seroma deep to the surgical incision. Continue compression icing and consider repeat injection into the site if symptoms return but given she is essentially asymptomatic no further evaluation needed at this time.

## 2017-05-31 NOTE — Procedures (Signed)
LIMITED MSK ULTRASOUND OF Right HIp Images were obtained and interpreted by myself, Teresa Coombs, DO  Images have been saved and stored to PACS system. Images obtained on: GE S7 Ultrasound machine  FINDINGS:   Persistent hypoechoic change deep to the surgical incision consistent with a small postsurgical seroma.  Glute medius musculature is normal appearing with neuropathic changes at the insertion  however these are improved in attendance last taken in the past.    Mild increased neovascularity  IMPRESSION:  1. Right glute medius tendinopathy 2. Postsurgical seroma

## 2017-05-31 NOTE — Assessment & Plan Note (Signed)
Overall great improvement from her last visit she is essentially pain-free from day-to-day standpoint does noticed persistent soreness.  She does have some remaining changes ultrasound like her to continue with exercises and treatment protocol.  Continue with Body Helix hip spica.  We will plan to check in with her in 10 weeks to ensure clinical resolution and consider discontinuing nitroglycerin protocol at that time.

## 2017-06-08 ENCOUNTER — Encounter: Payer: Self-pay | Admitting: Obstetrics and Gynecology

## 2017-06-08 ENCOUNTER — Ambulatory Visit (INDEPENDENT_AMBULATORY_CARE_PROVIDER_SITE_OTHER): Payer: Medicare Other | Admitting: Obstetrics and Gynecology

## 2017-06-08 VITALS — BP 120/60 | HR 70 | Resp 16 | Ht 63.5 in | Wt 106.0 lb

## 2017-06-08 DIAGNOSIS — Z124 Encounter for screening for malignant neoplasm of cervix: Secondary | ICD-10-CM

## 2017-06-08 DIAGNOSIS — Z23 Encounter for immunization: Secondary | ICD-10-CM | POA: Diagnosis not present

## 2017-06-08 DIAGNOSIS — Z01419 Encounter for gynecological examination (general) (routine) without abnormal findings: Secondary | ICD-10-CM

## 2017-06-08 NOTE — Progress Notes (Signed)
71 y.o. G72P0002 Married Caucasian female here for annual exam.    Having vaginal dryness.  Uses KY jelly which works OK. Olive oil. Prefers to avoid hormone tx.   Uses Nitroglycerin patch to treat hip pain and increase blood flow.   Physically active and loves to ski.   Lived in [place] for 25 years.  Retired in [place].  Worked in Hydrographic surveyor.  Married for 50 years. 4 grandchildren.   PCP: Jonathon Jordan     Patient's last menstrual period was 10/12/1997.           Sexually active: Yes.    The current method of family planning is post menopausal status.    Exercising: Yes.    walking Smoker:  no  Health Maintenance: Pap: 05/25/16 Negative  05/19/13, Negative with neg HR HPV History of abnormal Pap:  no MMG:  10/08/16 BIRADS 2 benign/density b Colonoscopy:  10/2014- Normal f/u in 10 years (2026) BMD:   09/2016  Result: Osteopenia  TDaP:  Unsure  HIV: Never Hep C: 05/25/16 Neg  Screening Labs: PCP   reports that she has never smoked. She has never used smokeless tobacco. She reports that she drinks about 3.0 - 3.6 oz of alcohol per week . She reports that she does not use drugs.  Past Medical History:  Diagnosis Date  . Dry eyes    uses Restasis eye drops.  . Hip pain    "bursitis right hip"  . Melanoma ([place])   . Osteopenia    was on fosamax for awhile  . Syncope and collapse 10/01/2016  . Tremor   . Wolff-Parkinson-White (WPW) syndrome 1992   heart ablation    Past Surgical History:  Procedure Laterality Date  . BREAST BIOPSY  1975   benign lump  . BREAST CYST ASPIRATION     benign  . DILATION AND CURETTAGE OF UTERUS  1977   with BTL  . EXCISION/RELEASE BURSA HIP Right 02/17/2016   Procedure: RIGHT HIP BURSECTOMY GLUTEAL TENDON REPAIR;  Surgeon: Gaynelle Arabian, MD;  Location: [facility];  Service: Orthopedics;  Laterality: Right;  . heart ablation  1992   [place]"Wolfe-Parkinson white syndrome"  . MELANOMA EXCISION Right 06/2009   right ankle  . SPINE  SURGERY  2018   [place] - decompression, foraminectomy.   . TONSILLECTOMY      Current Outpatient Prescriptions  Medication Sig Dispense Refill  . acetaminophen (TYLENOL) 325 MG tablet Take 650 mg by mouth as needed.    . Ascorbic Acid (VITAMIN C) 1000 MG tablet Take 1,000 mg by mouth daily.    . Ascorbic Acid Buffered (BUFFERED VITAMIN C) 1000 MG CAPS Take 1,000 mg by mouth daily.    Marland Kitchen aspirin 81 MG tablet Take 81 mg by mouth daily.    . B Complex Vitamins (B COMPLEX PO) Take 1 tablet by mouth daily.    . calcium carbonate (OS-CAL) 600 MG tablet Take 600 mg by mouth daily.    . Cholecalciferol (VITAMIN D) 2000 UNITS CAPS Take 2,000 Units by mouth daily.     . Coenzyme Q10 (COQ10) 100 MG CAPS     . cycloSPORINE (RESTASIS) 0.05 % ophthalmic emulsion Place 1 drop into both eyes 2 (two) times daily.    . fish oil-omega-3 fatty acids 1000 MG capsule Take 1 g by mouth 2 (two) times daily.     Marland Kitchen ibuprofen (ADVIL,MOTRIN) 200 MG tablet Take 200 mg by mouth as needed.    . Multiple Vitamin (MULTIVITAMIN) tablet  Take 1 tablet by mouth daily.    . nitroGLYCERIN (NITRODUR - DOSED IN MG/24 HR) 0.2 mg/hr patch Place 1/4 of patch over affected region. Remove and replace once daily.  Slightly alter skin placement daily 30 patch 1  . Probiotic Product (PROBIOTIC DAILY PO) Take 1 capsule by mouth daily.      No current facility-administered medications for this visit.     Family History  Problem Relation Age of Onset  . Lung cancer Father 65  . Hypertension Mother   . Hyperlipidemia Mother   . Congestive Heart Failure Mother   . Osteoporosis Sister 53  . Osteoporosis Sister   . Hodgkin's lymphoma Paternal Grandfather   . Hypertension Maternal Grandmother   . Hyperlipidemia Maternal Grandmother   . Congestive Heart Failure Paternal Grandmother   . Tremor Daughter     ROS:  Pertinent items are noted in HPI.  Otherwise, a comprehensive ROS was negative.  Exam:   BP 120/60 (BP Location: Right  Arm, Patient Position: Sitting, Cuff Size: Normal)   Pulse 70   Resp 16   Ht 5' 3.5" (1.613 m)   Wt 106 lb (48.1 kg)   LMP 10/12/1997   BMI 18.48 kg/m     General appearance: alert, cooperative and appears stated age Head: Normocephalic, without obvious abnormality, atraumatic Neck: no adenopathy, supple, symmetrical, trachea midline and thyroid normal to inspection and palpation Lungs: clear to auscultation bilaterally Breasts: normal appearance, no masses or tenderness, No nipple retraction or dimpling, No nipple discharge or bleeding, No axillary or supraclavicular adenopathy Heart: regular rate and rhythm Abdomen: soft, non-tender; no masses, no organomegaly Extremities: extremities normal, atraumatic, no cyanosis or edema Skin: Skin color, texture, turgor normal. No rashes or lesions Lymph nodes: Cervical, supraclavicular, and axillary nodes normal. No abnormal inguinal nodes palpated Neurologic: Grossly normal  Pelvic: External genitalia:  no lesions              Urethra:  normal appearing urethra with no masses, tenderness or lesions              Bartholins and Skenes: normal                 Vagina: normal appearing vagina with normal color and discharge, no lesions.  Small BB sized cyst of right vaginal wall, palpable but not visible.               Cervix: no lesions              Pap taken: No. Bimanual Exam:  Uterus:  normal size, contour, position, consistency, mobility, non-tender              Adnexa: no mass, fullness, tenderness              Rectal exam: Yes.  .  Confirms.              Anus:  normal sphincter tone, no lesions  Chaperone was present for exam.  Assessment:   Well woman visit with normal exam. Osteopenia.  Hx melanoma.  Small vaginal wall cyst.  Vaginal atrophy.   Plan: Mammogram screening discussed. Recommended self breast awareness. Pap and HR HPV as above. Guidelines for Calcium, Vitamin D, regular exercise program including cardiovascular  and weight bearing exercise. Discussed cooking oils - coconut oil. Tdap today.  Labs with PCP.  BMD in 2019. Follow up annually and prn.   After visit summary provided.

## 2017-06-08 NOTE — Patient Instructions (Signed)

## 2017-06-12 ENCOUNTER — Other Ambulatory Visit: Payer: Self-pay | Admitting: Sports Medicine

## 2017-06-12 DIAGNOSIS — M6798 Unspecified disorder of synovium and tendon, other site: Secondary | ICD-10-CM

## 2017-06-12 DIAGNOSIS — M67951 Unspecified disorder of synovium and tendon, right thigh: Secondary | ICD-10-CM

## 2017-06-30 ENCOUNTER — Ambulatory Visit (INDEPENDENT_AMBULATORY_CARE_PROVIDER_SITE_OTHER): Payer: Medicare Other | Admitting: Sports Medicine

## 2017-06-30 ENCOUNTER — Encounter: Payer: Self-pay | Admitting: Sports Medicine

## 2017-06-30 VITALS — BP 110/78 | HR 72 | Ht 63.5 in | Wt 105.0 lb

## 2017-06-30 DIAGNOSIS — M67951 Unspecified disorder of synovium and tendon, right thigh: Secondary | ICD-10-CM

## 2017-06-30 DIAGNOSIS — M6798 Unspecified disorder of synovium and tendon, other site: Secondary | ICD-10-CM | POA: Diagnosis not present

## 2017-06-30 DIAGNOSIS — M47816 Spondylosis without myelopathy or radiculopathy, lumbar region: Secondary | ICD-10-CM | POA: Diagnosis not present

## 2017-06-30 NOTE — Progress Notes (Signed)
OFFICE VISIT NOTE Megan Taylor. Megan Taylor, [place] at [facility] - 71 y.o. female MRN [id number]  Date of birth: [birth year]  Visit Date: 06/30/2017  PCP: Briscoe Deutscher, DO   Referred by: Jonathon Jordan, MD  Burlene Arnt, CMA acting as scribe for Dr. Paulla Fore.  SUBJECTIVE:   Chief Complaint  Patient presents with  . Follow-up    tendinopathy RT gluteus medius, lumbar spondylosis   HPI: As below and per problem based documentation when appropriate.  Megan Taylor is an established patient presenting today in follow-up of tendinopathy of the RT gluteus medius and lumbar spondylosis. She was last seen 04/21/2017 and advise to continue use of Body Helix hip spica and Nitroglycerin patches.   Pt reports that she tried the Body Helix but was unable to find it in her size she she has been wearing Spanx and reports improvement with pain while wearing this. She has been using Nitroglycerin patches with no side effects. She feels that she was doing better a couple of months ago than she is doing now. She is starting with wake from sleep again d/t the pain. Pain is still in the RT gluteal muscle and radiates into the RT leg. Pain is worse when she sleeps on her LT side but she cannot sleep on her back d/t back pain. She has tried taking Advil for the pain but NSAIDs cause GI upset so she doesn't take them often. She says that back in June she was able to sleep on the RT side for a little while but now she cannot ly on her RT side for 5 minutes before the pain starts. She has been working with a Physiological scientist who has been helping her with stretching and upper body strength. She has noticed that when she does a lot of upper body workouts she develops neck pain and HA.     Review of Systems  Constitutional: Negative for chills and fever.  Respiratory: Negative for shortness of breath and wheezing.   Cardiovascular: Negative for chest  pain, palpitations and leg swelling.  Musculoskeletal: Positive for myalgias and neck pain. Negative for falls.  Neurological: Positive for headaches. Negative for dizziness and tingling.  Endo/Heme/Allergies: Does not bruise/bleed easily.    Otherwise per HPI.   HISTORY & PERTINENT PRIOR DATA:  Prior History reviewed and updated per electronic medical record. Significant history, findings, studies and interim changes include: No additional findings.  reports that  has never smoked. she has never used smokeless tobacco. No results for input(s): HGBA1C, LABURIC, CREATINE in the last 8760 hours. No problems updated.  OBJECTIVE:  VS:  HT:5' 3.5" (161.3 cm)   WT:105 lb (47.6 kg)  BMI:18.31    BP:110/78  HR:72bpm  TEMP: ( )  RESP:97 %  PHYSICAL EXAM: Constitutional: WDWN, Non-toxic appearing. Psychiatric: Alert & appropriately interactive. Not depressed or anxious appearing. Respiratory: No increased work of breathing. Trachea Midline Eyes: Pupils are equal. EOM intact without nystagmus. No scleral icterus  Bilateral lower extremity is overall well aligned.  She has improved recruitment of the glute medius tendon but still has moderate degree of weakness with this.  She has good internal and external rotation bilateral hips.  Negative straight leg raise.  She does have some tightness.  Tight hip flexors.   ASSESSMENT & PLAN:   1. Tendinopathy of right gluteus medius   2. Lumbar spondylosis    Plan: Discussed options for therapeutic interventions but  overall she reports being fairly happy with how she is doing associates the slight increase worsening due to increasing her activities.  We will have her continue with therapeutic exercises as previously outlined and continue with nitroglycerin protocol.  Okay to wean off of over the next several weeks if she continues to do well and we will plan to follow-up with her if any recurrence of  symptoms.  ++++++++++++++++++++++++++++++++++++++++++++ Follow-up: Return if symptoms worsen or fail to improve.   Pertinent documentation may be included in additional procedure notes, imaging studies, problem based documentation and patient instructions. Please see these sections of the encounter for additional information regarding this visit. CMA/ATC served as Education administrator during this visit. History, Physical, and Plan performed by medical provider. Documentation and orders reviewed and attested to.      Gerda Diss, [facility] Physician

## 2017-07-13 ENCOUNTER — Ambulatory Visit (INDEPENDENT_AMBULATORY_CARE_PROVIDER_SITE_OTHER): Payer: Medicare Other | Admitting: Family Medicine

## 2017-07-13 ENCOUNTER — Encounter: Payer: Self-pay | Admitting: Family Medicine

## 2017-07-13 VITALS — BP 114/76 | HR 72 | Temp 97.9°F | Ht 63.5 in | Wt 105.2 lb

## 2017-07-13 DIAGNOSIS — R5383 Other fatigue: Secondary | ICD-10-CM

## 2017-07-13 DIAGNOSIS — E538 Deficiency of other specified B group vitamins: Secondary | ICD-10-CM

## 2017-07-13 DIAGNOSIS — Z0000 Encounter for general adult medical examination without abnormal findings: Secondary | ICD-10-CM | POA: Insufficient documentation

## 2017-07-13 DIAGNOSIS — F4320 Adjustment disorder, unspecified: Secondary | ICD-10-CM | POA: Diagnosis not present

## 2017-07-13 DIAGNOSIS — E559 Vitamin D deficiency, unspecified: Secondary | ICD-10-CM

## 2017-07-13 LAB — COMPREHENSIVE METABOLIC PANEL
ALT: 16 U/L (ref 0–35)
AST: 17 U/L (ref 0–37)
Albumin: 4.4 g/dL (ref 3.5–5.2)
Alkaline Phosphatase: 59 U/L (ref 39–117)
BUN: 23 mg/dL (ref 6–23)
CO2: 32 mEq/L (ref 19–32)
Calcium: 10.1 mg/dL (ref 8.4–10.5)
Chloride: 103 mEq/L (ref 96–112)
Creatinine, Ser: 0.78 mg/dL (ref 0.40–1.20)
GFR: 77.82 mL/min (ref >60.00)
Glucose, Bld: 89 mg/dL (ref 70–99)
Potassium: 5.5 mEq/L — ABNORMAL HIGH (ref 3.5–5.1)
Sodium: 140 mEq/L (ref 135–145)
Total Bilirubin: 0.6 mg/dL (ref 0.2–1.2)
Total Protein: 7 g/dL (ref 6.0–8.3)

## 2017-07-13 LAB — CBC WITH DIFFERENTIAL/PLATELET
Basophils Absolute: 0 10*3/uL (ref 0.0–0.1)
Basophils Relative: 0.9 % (ref 0.0–3.0)
Eosinophils Absolute: 0.1 10*3/uL (ref 0.0–0.7)
Eosinophils Relative: 1.6 % (ref 0.0–5.0)
HCT: 45.8 % (ref 36.0–46.0)
Hemoglobin: 15.4 g/dL — ABNORMAL HIGH (ref 12.0–15.0)
Lymphocytes Relative: 36.6 % (ref 12.0–46.0)
Lymphs Abs: 1.6 10*3/uL (ref 0.7–4.0)
MCHC: 33.6 g/dL (ref 30.0–36.0)
MCV: 94.8 fl (ref 78.0–100.0)
Monocytes Absolute: 0.4 10*3/uL (ref 0.1–1.0)
Monocytes Relative: 9.4 % (ref 3.0–12.0)
Neutro Abs: 2.2 10*3/uL (ref 1.4–7.7)
Neutrophils Relative %: 51.5 % (ref 43.0–77.0)
Platelets: 208 10*3/uL (ref 150.0–400.0)
RBC: 4.83 Mil/uL (ref 3.87–5.11)
RDW: 12.9 % (ref 11.5–15.5)
WBC: 4.4 10*3/uL (ref 4.0–10.5)

## 2017-07-13 LAB — T4, FREE: Free T4: 0.84 ng/dL (ref 0.60–1.60)

## 2017-07-13 LAB — TSH: TSH: 3.09 u[IU]/mL (ref 0.35–4.50)

## 2017-07-13 LAB — VITAMIN B12: Vitamin B-12: 974 pg/mL — ABNORMAL HIGH (ref 211–911)

## 2017-07-13 LAB — VITAMIN D 25 HYDROXY (VIT D DEFICIENCY, FRACTURES): VITD: 85.47 ng/mL (ref 30.00–100.00)

## 2017-07-13 NOTE — Progress Notes (Signed)
Megan Taylor is a 71 y.o. female is here to [facility].   Patient Care Team: Jonathon Jordan, MD as PCP - General (Family Medicine)   History of Present Illness:   Megan Taylor, CMA, acting as scribe for Dr. Juleen China.  HPI:  Patient comes in today to establish care.  She states she feels well physically but feels that she may be having some emotional difficulty.  She states that she has been experiencing some small "hot flashes" and then she will have a very strange feeling.  She states it is very hard to explain the feeling.  She can't determine if it is anxiety or depression.  She states "the world feels different to her".  She states she has always been a very positive and optimistic person.  She cannot think of anything in her life that has changed aside from retiring 5 years ago.    She continues to stay active.  She walks daily.  She eats healthy, but states she feels she spends 2/3 of her life shopping, cooking, or cleaning.  She states she and her husband do not get out of the house as much as they used to.  She states she is not sleeping well.  She states sometimes she will wake up due to the pain in her hip and have to change position.  She then has difficulty falling back to sleep.  She becomes tearful at times because she does not want to be labeled as depressed.  She states she has a wonderful life and does not understand why she feels the way she does.    She started nitroglycerin patches in April/May.  Starting them did not correlate with the difference in her emotional state.  She does not like to take prescription medications.  She sees Dr. Paulla Fore for chronic hip pain and back pain.  States she has tried pain medications and they did not help her.  Patient scored a 9 on PHQ-9 today.  There are no preventive care reminders to display for this patient. Depression screen PHQ 2/9 07/13/2017  Decreased Interest 2  Down, Depressed, Hopeless 2  PHQ - 2 Score 4  Altered  sleeping 2  Tired, decreased energy 1  Change in appetite 0  Feeling bad or failure about yourself  1  Trouble concentrating 0  Moving slowly or fidgety/restless 0  Suicidal thoughts 1  PHQ-9 Score 9  Difficult doing work/chores Very difficult   PMHx, SurgHx, SocialHx, Medications, and Allergies were reviewed in the Visit Navigator and updated as appropriate.   Past Medical History:  Diagnosis Date  . Dry eyes    uses Restasis eye drops.  . Hip pain    "bursitis right hip"  . Melanoma ([place])   . Osteopenia    was on fosamax for awhile  . Syncope and collapse 10/01/2016  . Tremor   . Wolff-Parkinson-White (WPW) syndrome 1992   heart ablation   Past Surgical History:  Procedure Laterality Date  . BREAST BIOPSY  1975   benign lump  . BREAST CYST ASPIRATION     benign  . DILATION AND CURETTAGE OF UTERUS  1977   with BTL  . EXCISION/RELEASE BURSA HIP Right 02/17/2016   Procedure: RIGHT HIP BURSECTOMY GLUTEAL TENDON REPAIR;  Surgeon: Gaynelle Arabian, MD;  Location: [facility];  Service: Orthopedics;  Laterality: Right;  . heart ablation  1992   [place]"Wolfe-Parkinson white syndrome"  . MELANOMA EXCISION Right 06/2009   right ankle  . SPINE  SURGERY  2018   [place] - decompression, foraminectomy.   . TONSILLECTOMY     Family History  Problem Relation Age of Onset  . Lung cancer Father 76  . Hypertension Mother   . Hyperlipidemia Mother   . Congestive Heart Failure Mother   . Osteoporosis Sister 32  . Osteoporosis Sister   . Hodgkin's lymphoma Paternal Grandfather   . Hypertension Maternal Grandmother   . Hyperlipidemia Maternal Grandmother   . Congestive Heart Failure Paternal Grandmother   . Tremor Daughter    Social History  Substance Use Topics  . Smoking status: Never Smoker  . Smokeless tobacco: Never Used  . Alcohol use 3.0 - 3.6 oz/week    5 - 6 Standard drinks or equivalent per week     Comment: wine   Current Medications and Allergies:   .  acetaminophen  (TYLENOL) 325 MG tablet, Take 650 mg by mouth as needed., Disp: , Rfl:  .  Ascorbic Acid (VITAMIN C) 1000 MG tablet, Take 1,000 mg by mouth daily., Disp: , Rfl:  .  Ascorbic Acid Buffered (BUFFERED VITAMIN C) 1000 MG CAPS, Take 1,000 mg by mouth daily., Disp: , Rfl:  .  aspirin 81 MG tablet, Take 81 mg by mouth daily., Disp: , Rfl:  .  B Complex Vitamins (B COMPLEX PO), Take 1 tablet by mouth daily., Disp: , Rfl:  .  calcium carbonate (OS-CAL) 600 MG tablet, Take 600 mg by mouth daily., Disp: , Rfl:  .  Cholecalciferol (VITAMIN D) 2000 UNITS CAPS, Take 2,000 Units by mouth daily. , Disp: , Rfl:  .  Coenzyme Q10 (COQ10) 100 MG CAPS, , Disp: , Rfl:  .  cycloSPORINE (RESTASIS) 0.05 % ophthalmic emulsion, Place 1 drop into both eyes 2 (two) times daily., Disp: , Rfl:  .  fish oil-omega-3 fatty acids 1000 MG capsule, Take 1 g by mouth 2 (two) times daily. , Disp: , Rfl:  .  ibuprofen (ADVIL,MOTRIN) 200 MG tablet, Take 200 mg by mouth as needed., Disp: , Rfl:  .  Multiple Vitamin (MULTIVITAMIN) tablet, Take 1 tablet by mouth daily., Disp: , Rfl:  .  nitroGLYCERIN (NITRODUR - DOSED IN MG/24 HR) 0.2 mg/hr patch, PLACE 1/4 PATCH OVER AFFECTED REGION REMOVE AND REPLACE ONCE DAILY SLIGHTLY ALTER SKIN PLACEMENT, Disp: 30 patch, Rfl: 1 .  Probiotic Product (PROBIOTIC DAILY PO), Take 1 capsule by mouth daily. , Disp: , Rfl:   Allergies  Allergen Reactions  . St. John's Wort [Hypericum Perforatum]   . Aleve [Naproxen Sodium] Other (See Comments)    Hot flashes, fever  . Augmentin [Amoxicillin-Pot Clavulanate] Other (See Comments)    Severe diarrhea after one dose   Review of Systems:   Pertinent items are noted in the HPI. Otherwise, ROS is negative.  Vitals:   Vitals:   07/13/17 0918  BP: 114/76  Pulse: 72  Temp: 97.9 F (36.6 C)  TempSrc: Oral  SpO2: 97%  Weight: 105 lb 3.2 oz (47.7 kg)  Height: 5' 3.5" (1.613 m)     Body mass index is 18.34 kg/m.   Physical Exam:   Physical Exam    Constitutional: She is oriented to person, place, and time. She appears well-developed and well-nourished. No distress.  HENT:  Head: Normocephalic and atraumatic.  Eyes: Pupils are equal, round, and reactive to light. EOM are normal.  Neck: Normal range of motion. Neck supple.  Cardiovascular: Normal rate, regular rhythm, normal heart sounds and intact distal pulses.   Pulmonary/Chest: Effort  normal and breath sounds normal.  Abdominal: Soft. Bowel sounds are normal.  Neurological: She is alert and oriented to person, place, and time.  Skin: Skin is warm. Capillary refill takes less than 2 seconds.  Psychiatric: She has a normal mood and affect. Her behavior is normal.  Nursing note and vitals reviewed.  Assessment and Plan:   Anila was seen today for establish care.  Diagnoses and all orders for this visit:  Routine health maintenance -     CBC with Differential/Platelet -     Comprehensive metabolic panel -     TSH -     T4, free -     VITAMIN D 25 Hydroxy (Vit-D Deficiency, Fractures) -     Vitamin B12  Adjustment disorder, unspecified type -     CBC with Differential/Platelet -     Comprehensive metabolic panel -     TSH -     T4, free -     VITAMIN D 25 Hydroxy (Vit-D Deficiency, Fractures) -     Vitamin B12  Vitamin D deficiency -     VITAMIN D 25 Hydroxy (Vit-D Deficiency, Fractures)  B12 deficiency -     Vitamin B12  Fatigue, unspecified type -     TSH -     T4, free   . Reviewed expectations re: course of current medical issues. . Discussed self-management of symptoms. . Outlined signs and symptoms indicating need for more acute intervention. . Patient verbalized understanding and all questions were answered. Marland Kitchen Health Maintenance issues including appropriate healthy diet, exercise, and smoking avoidance were discussed with patient. . See orders for this visit as documented in the electronic medical record. . Patient received an After Visit  Summary.  CMA served as Education administrator during this visit. History, Physical, and Plan performed by medical provider. The above documentation has been reviewed and is accurate and complete. Briscoe Deutscher, D.O.  Briscoe Deutscher, DO [place] 07/13/2017  Future Appointments Date Time Provider [place]  08/13/2017 10:00 AM [place] Sherrilyn Rist, [place] [facility] None  08/16/2017 10:00 AM Stephanie Acre, RN [facility] None  10/15/2017 10:00 AM Briscoe Deutscher, DO [facility] None

## 2017-07-13 NOTE — Patient Instructions (Addendum)
Schedule an appointment with Trey Paula, LCSW, if you would like to.  Recommend melatonin 2-3 mg at bedtime for sleep.  Try tumeric.

## 2017-07-21 ENCOUNTER — Telehealth: Payer: Self-pay | Admitting: Family Medicine

## 2017-07-21 DIAGNOSIS — E875 Hyperkalemia: Secondary | ICD-10-CM

## 2017-07-21 NOTE — Telephone Encounter (Signed)
Patient called in reference to getting my chart message about needing to come have labs done. Did not see orders in for labs. Please call patient and advise. OK to leave message.

## 2017-07-21 NOTE — Telephone Encounter (Signed)
Left message for patient to call the office to schedule a lab appointment. I have placed the lab order.

## 2017-07-22 ENCOUNTER — Other Ambulatory Visit (INDEPENDENT_AMBULATORY_CARE_PROVIDER_SITE_OTHER): Payer: Medicare Other

## 2017-07-22 DIAGNOSIS — E875 Hyperkalemia: Secondary | ICD-10-CM | POA: Diagnosis not present

## 2017-07-22 LAB — BASIC METABOLIC PANEL
BUN: 20 mg/dL (ref 6–23)
CO2: 31 mEq/L (ref 19–32)
Calcium: 9.5 mg/dL (ref 8.4–10.5)
Chloride: 101 mEq/L (ref 96–112)
Creatinine, Ser: 0.81 mg/dL (ref 0.40–1.20)
GFR: 74.5 mL/min (ref >60.00)
Glucose, Bld: 95 mg/dL (ref 70–99)
Potassium: 3.5 mEq/L (ref 3.5–5.1)
Sodium: 139 mEq/L (ref 135–145)

## 2017-07-22 NOTE — Addendum Note (Signed)
Addended by: Frutoso Chase A on: 07/22/2017 02:02 PM   Modules accepted: Orders

## 2017-08-13 ENCOUNTER — Ambulatory Visit (INDEPENDENT_AMBULATORY_CARE_PROVIDER_SITE_OTHER): Payer: Medicare Other | Admitting: Psychology

## 2017-08-13 DIAGNOSIS — F320 Major depressive disorder, single episode, mild: Secondary | ICD-10-CM

## 2017-08-16 ENCOUNTER — Ambulatory Visit: Payer: Medicare Other | Admitting: *Deleted

## 2017-08-16 ENCOUNTER — Other Ambulatory Visit: Payer: Self-pay | Admitting: Sports Medicine

## 2017-08-16 DIAGNOSIS — M6798 Unspecified disorder of synovium and tendon, other site: Secondary | ICD-10-CM

## 2017-08-16 DIAGNOSIS — M67951 Unspecified disorder of synovium and tendon, right thigh: Secondary | ICD-10-CM

## 2017-08-18 ENCOUNTER — Ambulatory Visit: Payer: Medicare Other | Admitting: *Deleted

## 2017-09-08 NOTE — Progress Notes (Signed)
PCP notes:   Health maintenance: NA.   Abnormal screenings: NA   Patient concerns: NA   Nurse concerns: NA   Next PCP appt: 10/20/2017

## 2017-09-08 NOTE — Progress Notes (Signed)
Subjective:   Megan Taylor is a 71 y.o. female who presents for Medicare Annual (Subsequent) preventive examination.  Lives in single family one story home with husband.  Review of Systems:  No ROS.  Medicare Wellness Visit. Additional risk factors are reflected in the social history.  Cardiac Risk Factors include: advanced age (>58men, >23 women);dyslipidemia     Objective:     Vitals: BP 136/80 (BP Location: Right Arm, Patient Position: Sitting, Cuff Size: Normal)   Pulse (!) 57   Resp 16   Ht 5\' 4"  (1.626 m)   Wt 106 lb (48.1 kg)   LMP 10/12/1997   SpO2 98%   BMI 18.19 kg/m   Body mass index is 18.19 kg/m.   Tobacco Social History   Tobacco Use  Smoking Status Never Smoker  Smokeless Tobacco Never Used     Counseling given: Not Answered   Past Medical History:  Diagnosis Date  . Dry eyes    uses Restasis eye drops.  . Hip pain    "bursitis right hip"  . Melanoma ([place])   . Osteopenia    was on fosamax for awhile  . Syncope and collapse 10/01/2016  . Tremor   . Wolff-Parkinson-White (WPW) syndrome 1992   heart ablation   Past Surgical History:  Procedure Laterality Date  . BREAST BIOPSY  1975   benign lump  . BREAST CYST ASPIRATION     benign  . DILATION AND CURETTAGE OF UTERUS  1977   with BTL  . EXCISION/RELEASE BURSA HIP Right 02/17/2016   Procedure: RIGHT HIP BURSECTOMY GLUTEAL TENDON REPAIR;  Surgeon: Gaynelle Arabian, MD;  Location: [facility];  Service: Orthopedics;  Laterality: Right;  . heart ablation  1992   [place]"Wolfe-Parkinson white syndrome"  . MELANOMA EXCISION Right 06/2009   right ankle  . SPINE SURGERY  2018   [place] - decompression, foraminectomy.   . TONSILLECTOMY     Family History  Problem Relation Age of Onset  . Lung cancer Father 7  . Hypertension Mother   . Hyperlipidemia Mother   . Congestive Heart Failure Mother   . Osteoporosis Sister 64  . Osteoporosis Sister   . Hodgkin's lymphoma Paternal Grandfather   .  Hypertension Maternal Grandmother   . Hyperlipidemia Maternal Grandmother   . Congestive Heart Failure Paternal Grandmother   . Tremor Daughter    Social History   Substance and Sexual Activity  Sexual Activity Yes  . Partners: Male  . Birth control/protection: Post-menopausal    Outpatient Encounter Medications as of 09/09/2017  Medication Sig  . acetaminophen (TYLENOL) 325 MG tablet Take 650 mg by mouth as needed.  . Ascorbic Acid Buffered (BUFFERED VITAMIN C) 1000 MG CAPS Take 1,000 mg by mouth daily.  Marland Kitchen aspirin 81 MG tablet Take 81 mg by mouth daily.  . B Complex Vitamins (B COMPLEX PO) Take 1 tablet by mouth daily.  . calcium carbonate (OS-CAL) 600 MG tablet Take 600 mg by mouth daily.  . Cholecalciferol (VITAMIN D) 2000 UNITS CAPS Take 2,000 Units by mouth daily.   . Coenzyme Q10 (COQ10) 100 MG CAPS   . cycloSPORINE (RESTASIS) 0.05 % ophthalmic emulsion Place 1 drop into both eyes 2 (two) times daily.  . fish oil-omega-3 fatty acids 1000 MG capsule Take 1 g by mouth 2 (two) times daily.   Marland Kitchen ibuprofen (ADVIL,MOTRIN) 200 MG tablet Take 200 mg by mouth as needed.  . nitroGLYCERIN (NITRODUR - DOSED IN MG/24 HR) 0.2 mg/hr patch PLACE  1/4 PATCH OVER AFFECTED REGION REMOVE AND REPLACE ONCE DAILY SLIGHTLY ALTER SKIN PLACEMENT  . Probiotic Product (PROBIOTIC DAILY PO) Take 1 capsule by mouth daily.   . [DISCONTINUED] Ascorbic Acid (VITAMIN C) 1000 MG tablet Take 1,000 mg by mouth daily.  . [DISCONTINUED] Multiple Vitamin (MULTIVITAMIN) tablet Take 1 tablet by mouth daily.   No facility-administered encounter medications on file as of 09/09/2017.     Activities of Daily Living In your present state of health, do you have any difficulty performing the following activities: 09/09/2017  Hearing? N  Vision? N  Difficulty concentrating or making decisions? N  Walking or climbing stairs? N  Dressing or bathing? N  Doing errands, shopping? N  Preparing Food and eating ? N  Using the  Toilet? N  In the past six months, have you accidently leaked urine? N  Do you have problems with loss of bowel control? N  Managing your Medications? N  Managing your Finances? N  Housekeeping or managing your Housekeeping? N  Some recent data might be hidden    Patient Care Team: Briscoe Deutscher, DO as PCP - General (Family Medicine) Shelor Melburn Hake, Rex Kras, LCSW as Social Worker (Licensed Clinical Social Worker)    Assessment:    Physical assessment deferred to PCP.  Exercise Activities and Dietary recommendations Current Exercise Habits: Home exercise routine, Type of exercise: walking;Other - see comments;strength training/weights(Personal trainer 2x/week. Walking 2 miles 7x/week. ), Frequency (Times/Week): 7, Intensity: Moderate, Exercise limited by: None identified  Goals    None     Fall Risk Fall Risk  09/09/2017 07/13/2017 10/01/2016  Falls in the past year? No No No   Depression Screen PHQ 2/9 Scores 09/09/2017 07/13/2017  PHQ - 2 Score 3 4  PHQ- 9 Score 6 9  PHQ2 and PHQ9 was completed. Pt has experienced some depression, loss of interest in things, and feeling bad about herself.  Pt has seen Trey Paula once and has another visit scheduled for December. She believes this has helped with her depression. Denies any suicidal ideation.  Cognitive Function Ad8 score reviewed for issues:  Issues making decisions:no  Less interest in hobbies / activities:no  Repeats questions, stories (family complaining):no  Trouble using ordinary gadgets (microwave, computer, phone):no  Forgets the month or year: no  Mismanaging finances: no  Remembering appts:no  Daily problems with thinking and/or memory:no Ad8 score is=0        Immunization History  Administered Date(s) Administered  . Tdap 06/08/2017  . Zoster 08/09/2014   Screening Tests Health Maintenance  Topic Date Due  . INFLUENZA VACCINE  07/13/2018 (Originally 05/12/2017)  . PNA vac Low Risk Adult (1 of 2  - PCV13) 07/13/2018 (Originally 06/23/2013)  . MAMMOGRAM  10/08/2017  . COLONOSCOPY  10/22/2024  . TETANUS/TDAP  06/09/2027  . DEXA SCAN  Completed  . Hepatitis C Screening  Completed      Plan:   Follow up with PCP as directed.  I have personally reviewed and noted the following in the patient's chart:   . Medical and social history . Use of alcohol, tobacco or illicit drugs  . Current medications and supplements . Functional ability and status . Nutritional status . Physical activity . Advanced directives . List of other physicians . Vitals . Screenings to include cognitive, depression, and falls . Referrals and appointments  In addition, I have reviewed and discussed with patient certain preventive protocols, quality metrics, and best practice recommendations. A written personalized care plan for preventive  services as well as general preventive health recommendations were provided to patient.     Williemae Area, RN  09/09/2017

## 2017-09-08 NOTE — Progress Notes (Signed)
Pre visit review using our clinic review tool, if applicable. No additional management support is needed unless otherwise documented below in the visit note. 

## 2017-09-09 ENCOUNTER — Encounter: Payer: Self-pay | Admitting: *Deleted

## 2017-09-09 ENCOUNTER — Ambulatory Visit (INDEPENDENT_AMBULATORY_CARE_PROVIDER_SITE_OTHER): Payer: Medicare Other | Admitting: *Deleted

## 2017-09-09 VITALS — BP 136/80 | HR 57 | Resp 16 | Ht 64.0 in | Wt 106.0 lb

## 2017-09-09 DIAGNOSIS — Z0000 Encounter for general adult medical examination without abnormal findings: Secondary | ICD-10-CM | POA: Diagnosis not present

## 2017-09-09 NOTE — Progress Notes (Signed)
I have personally reviewed the Medicare Annual Wellness questionnaire and have noted 1. The patient's medical and social history 2. Their use of alcohol, tobacco or illicit drugs 3. Their current medications and supplements 4. The patient's functional ability including ADL's, fall risks, home safety risks and hearing or visual impairment. 5. Diet and physical activities 6. Evidence for depression or mood disorders 7. Reviewed Updated provider list, see scanned forms and [facility] Snapshot.   The patients weight, height, BMI and visual acuity have been recorded in the chart I have made referrals, counseling and provided education to the patient based review of the above and I have provided the pt with a written personalized care plan for preventive services.  I have provided the patient with a copy of your personalized plan for preventive services. Instructed to take the time to review along with their updated medication list.  Gwenyth Dingee  

## 2017-09-09 NOTE — Patient Instructions (Addendum)
Megan Taylor ,  Bring a copy of your living will and/or healthcare power of attorney to your next office visit.  Thank you for taking time to come for your Medicare Wellness Visit. I appreciate your ongoing commitment to your health goals. Please review the following plan we discussed and let me know if I can assist you in the future.   This is a list of the screening recommended for you and due dates:  Health Maintenance  Topic Date Due  . Flu Shot  07/13/2018*  . Pneumonia vaccines (1 of 2 - PCV13) 07/13/2018*  . Mammogram  10/08/2017  . Colon Cancer Screening  10/22/2024  . Tetanus Vaccine  06/09/2027  . DEXA scan (bone density measurement)  Completed  .  Hepatitis C: One time screening is recommended by Center for Disease Control  (CDC) for  adults born from [birth year] through 1963.   Completed  *Topic was postponed. The date shown is not the original due date.   Preventive Care for Adults  A healthy lifestyle and preventive care can promote health and wellness. Preventive health guidelines for adults include the following key practices.  . A routine yearly physical is a good way to check with your health care provider about your health and preventive screening. It is a chance to share any concerns and updates on your health and to receive a thorough exam.  . Visit your dentist for a routine exam and preventive care every 6 months. Brush your teeth twice a day and floss once a day. Good oral hygiene prevents tooth decay and gum disease.  . The frequency of eye exams is based on your age, health, family medical history, use  of contact lenses, and other factors. Follow your health care provider's recommendations for frequency of eye exams.  . Eat a healthy diet. Foods like vegetables, fruits, whole grains, low-fat dairy products, and lean protein foods contain the nutrients you need without too many calories. Decrease your intake of foods high in solid fats, added sugars, and salt. Eat the  right amount of calories for you. Get information about a proper diet from your health care provider, if necessary.  . Regular physical exercise is one of the most important things you can do for your health. Most adults should get at least 150 minutes of moderate-intensity exercise (any activity that increases your heart rate and causes you to sweat) each week. In addition, most adults need muscle-strengthening exercises on 2 or more days a week.  Silver Sneakers may be a benefit available to you. To determine eligibility, you may visit the website: www.silversneakers.com or contact program at (909) [phone] Mon-Fri between 8AM-8PM.   . Maintain a healthy weight. The body mass index (BMI) is a screening tool to identify possible weight problems. It provides an estimate of body fat based on height and weight. Your health care provider can find your BMI and can help you achieve or maintain a healthy weight.   For adults 20 years and older: ? A BMI below 18.5 is considered underweight. ? A BMI of 18.5 to 24.9 is normal. ? A BMI of 25 to 29.9 is considered overweight. ? A BMI of 30 and above is considered obese.   . Maintain normal blood lipids and cholesterol levels by exercising and minimizing your intake of saturated fat. Eat a balanced diet with plenty of fruit and vegetables. Blood tests for lipids and cholesterol should begin at age 107 and be repeated every 5 years. If your lipid  or cholesterol levels are high, you are over 50, or you are at high risk for heart disease, you may need your cholesterol levels checked more frequently. Ongoing high lipid and cholesterol levels should be treated with medicines if diet and exercise are not working.  . If you smoke, find out from your health care provider how to quit. If you do not use tobacco, please do not start.  . If you choose to drink alcohol, please do not consume more than 2 drinks per day. One drink is considered to be 12 ounces (355 mL) of  beer, 5 ounces (148 mL) of wine, or 1.5 ounces (44 mL) of liquor.  . If you are 61-65 years old, ask your health care provider if you should take aspirin to prevent strokes.  . Use sunscreen. Apply sunscreen liberally and repeatedly throughout the day. You should seek shade when your shadow is shorter than you. Protect yourself by wearing long sleeves, pants, a wide-brimmed hat, and sunglasses year round, whenever you are outdoors.  . Once a month, do a whole body skin exam, using a mirror to look at the skin on your back. Tell your health care provider of new moles, moles that have irregular borders, moles that are larger than a pencil eraser, or moles that have changed in shape or color.

## 2017-09-14 ENCOUNTER — Encounter: Payer: Self-pay | Admitting: Sports Medicine

## 2017-09-14 DIAGNOSIS — H16223 Keratoconjunctivitis sicca, not specified as Sjogren's, bilateral: Secondary | ICD-10-CM | POA: Diagnosis not present

## 2017-09-14 DIAGNOSIS — H2513 Age-related nuclear cataract, bilateral: Secondary | ICD-10-CM | POA: Diagnosis not present

## 2017-09-23 ENCOUNTER — Ambulatory Visit (INDEPENDENT_AMBULATORY_CARE_PROVIDER_SITE_OTHER): Payer: Medicare Other | Admitting: Psychology

## 2017-09-23 DIAGNOSIS — F320 Major depressive disorder, single episode, mild: Secondary | ICD-10-CM | POA: Diagnosis not present

## 2017-10-08 ENCOUNTER — Ambulatory Visit (INDEPENDENT_AMBULATORY_CARE_PROVIDER_SITE_OTHER): Payer: Medicare Other | Admitting: Psychology

## 2017-10-08 DIAGNOSIS — F320 Major depressive disorder, single episode, mild: Secondary | ICD-10-CM

## 2017-10-15 ENCOUNTER — Encounter: Payer: Medicare Other | Admitting: Family Medicine

## 2017-10-19 ENCOUNTER — Ambulatory Visit (INDEPENDENT_AMBULATORY_CARE_PROVIDER_SITE_OTHER): Payer: Medicare Other | Admitting: Psychology

## 2017-10-19 DIAGNOSIS — F320 Major depressive disorder, single episode, mild: Secondary | ICD-10-CM

## 2017-10-20 ENCOUNTER — Ambulatory Visit (INDEPENDENT_AMBULATORY_CARE_PROVIDER_SITE_OTHER): Payer: Medicare Other | Admitting: Family Medicine

## 2017-10-20 ENCOUNTER — Encounter: Payer: Self-pay | Admitting: Family Medicine

## 2017-10-20 VITALS — BP 118/68 | HR 73 | Temp 98.1°F | Ht 63.5 in | Wt 106.6 lb

## 2017-10-20 DIAGNOSIS — Z1231 Encounter for screening mammogram for malignant neoplasm of breast: Secondary | ICD-10-CM | POA: Diagnosis not present

## 2017-10-20 DIAGNOSIS — Z13220 Encounter for screening for lipoid disorders: Secondary | ICD-10-CM

## 2017-10-20 DIAGNOSIS — Z1239 Encounter for other screening for malignant neoplasm of breast: Secondary | ICD-10-CM

## 2017-10-20 DIAGNOSIS — N952 Postmenopausal atrophic vaginitis: Secondary | ICD-10-CM

## 2017-10-20 LAB — LIPID PANEL
Cholesterol: 247 mg/dL — ABNORMAL HIGH (ref 0–200)
HDL: 107.1 mg/dL (ref >39.00)
LDL Cholesterol: 129 mg/dL — ABNORMAL HIGH (ref 0–99)
NonHDL: 139.7
Total CHOL/HDL Ratio: 2
Triglycerides: 55 mg/dL (ref 0.0–149.0)
VLDL: 11 mg/dL (ref 0.0–40.0)

## 2017-10-20 MED ORDER — ESTRADIOL 0.1 MG/GM VA CREA: TOPICAL_CREAM | VAGINAL | 0 refills | Status: DC

## 2017-10-20 NOTE — Patient Instructions (Addendum)
I recommend [facility] or [facility].

## 2017-10-20 NOTE — Progress Notes (Addendum)
Megan Taylor is a 72 y.o. female is here for follow up.  History of Present Illness:   HPI: See Assessment and Plan section for Problem Based Charting of issues discussed today.  Health Maintenance Due  Topic Date Due  . MAMMOGRAM  10/08/2017   Depression screen Megan Taylor 2/9 09/09/2017 07/13/2017  Decreased Interest 2 2  Down, Depressed, Hopeless 1 2  PHQ - 2 Score 3 4  Altered sleeping 0 2  Tired, decreased energy 0 1  Change in appetite 0 0  Feeling bad or failure about yourself  1 1  Trouble concentrating 0 0  Moving slowly or fidgety/restless 0 0  Suicidal thoughts 2 1  PHQ-9 Score 6 9  Difficult doing work/chores Very difficult Very difficult   PMHx, SurgHx, SocialHx, FamHx, Medications, and Allergies were reviewed in the Visit Navigator and updated as appropriate.   Patient Active Problem List   Diagnosis Date Noted  . Adjustment reaction 07/13/2017  . Vitamin D deficiency 07/13/2017  . Routine health maintenance 07/13/2017  . B12 deficiency 07/13/2017  . Fatigue 07/13/2017  . Seroma of musculoskeletal structure after musculoskeletal system procedure 04/21/2017  . Greater trochanteric bursitis of right hip 01/27/2017  . Lumbar spondylosis 01/26/2017  . Syncope and collapse 10/01/2016  . Tendinopathy of right gluteus medius 02/17/2016  . METATARSALGIA 02/05/2009  . BUNION, LEFT FOOT 02/05/2009  . FOOT PAIN, BILATERAL 02/05/2009  . TALIPES CAVUS 02/05/2009   Social History   Tobacco Use  . Smoking status: Never Smoker  . Smokeless tobacco: Never Used  Substance Use Topics  . Alcohol use: Yes    Alcohol/week: 3.0 - 3.6 oz    Types: 5 - 6 Standard drinks or equivalent per week    Comment: wine  . Drug use: No   Current Medications and Allergies:   .  Ascorbic Acid Buffered (BUFFERED VITAMIN C) 1000 MG CAPS, Take 1,000 mg by mouth daily., Disp: , Rfl:  .  aspirin 81 MG tablet, Take 81 mg by mouth daily., Disp: , Rfl:  .  B Complex Vitamins (B COMPLEX PO),  Take 1 tablet by mouth daily., Disp: , Rfl:  .  calcium carbonate (OS-CAL) 600 MG tablet, Take 600 mg by mouth daily., Disp: , Rfl:  .  Cholecalciferol (VITAMIN D) 2000 UNITS CAPS, Take 2,000 Units by mouth daily. , Disp: , Rfl:  .  Coenzyme Q10 (COQ10) 100 MG CAPS, , Disp: , Rfl:  .  cycloSPORINE (RESTASIS) 0.05 % ophthalmic emulsion, Place 1 drop into both eyes 2 (two) times daily., Disp: , Rfl:  .  fish oil-omega-3 fatty acids 1000 MG capsule, Take 1 g by mouth 2 (two) times daily. , Disp: , Rfl:  .  ibuprofen (ADVIL,MOTRIN) 200 MG tablet, Take 200 mg by mouth as needed., Disp: , Rfl:  .  nitroGLYCERIN (NITRODUR - DOSED IN MG/24 HR) 0.2 mg/hr patch, PLACE 1/4 PATCH OVER AFFECTED REGION REMOVE AND REPLACE ONCE DAILY SLIGHTLY ALTER SKIN PLACEMENT, Disp: 30 patch, Rfl: 1 .  Probiotic Product (PROBIOTIC DAILY PO), Take 1 capsule by mouth daily. , Disp: , Rfl:    Allergies  Allergen Reactions  . St. John's Wort [Hypericum Perforatum]   . Aleve [Naproxen Sodium] Other (See Comments)    Hot flashes, fever  . Augmentin [Amoxicillin-Pot Clavulanate] Other (See Comments)    Severe diarrhea after one dose   Review of Systems   Pertinent items are noted in the HPI. Otherwise, ROS is negative.  Vitals:  Vitals:   10/20/17 0959  BP: 118/68  Pulse: 73  Temp: 98.1 F (36.7 C)  TempSrc: Oral  SpO2: 93%  Weight: 106 lb 9.6 oz (48.4 kg)  Height: 5' 3.5" (1.613 m)     Body mass index is 18.59 kg/m.   Physical Exam:   Physical Exam  Constitutional: She is oriented to person, place, and time. She appears well-developed and well-nourished. No distress.  HENT:  Head: Normocephalic and atraumatic.  Right Ear: External ear normal.  Left Ear: External ear normal.  Nose: Nose normal.  Mouth/Throat: Oropharynx is clear and moist.  Eyes: Conjunctivae and EOM are normal. Pupils are equal, round, and reactive to light.  Neck: Normal range of motion. Neck supple. No thyromegaly present.    Cardiovascular: Normal rate, regular rhythm, normal heart sounds and intact distal pulses.  Pulmonary/Chest: Effort normal and breath sounds normal.  Abdominal: Soft. Bowel sounds are normal.  Musculoskeletal: Normal range of motion.  Lymphadenopathy:    She has no cervical adenopathy.  Neurological: She is alert and oriented to person, place, and time.  Skin: Skin is warm and dry. Capillary refill takes less than 2 seconds.  Psychiatric: She has a normal mood and affect. Her behavior is normal.  Nursing note and vitals reviewed.   Results for orders placed or performed in visit on 10/20/17  Lipid panel  Result Value Ref Range   Cholesterol 247 (H) 0 - 200 mg/dL   Triglycerides 55.0 0.0 - 149.0 mg/dL   HDL 107.10 >39.00 mg/dL   VLDL 11.0 0.0 - 40.0 mg/dL   LDL Cholesterol 129 (H) 0 - 99 mg/dL   Total CHOL/HDL Ratio 2    NonHDL 139.70     Assessment and Plan:   Megan Taylor was seen today for annual exam.  Diagnoses and all orders for this visit:  Atrophic vaginitis Comments: Patient is generally stable and happy.  She does admit to vaginal dryness.  Intercourse is becoming more uncomfortable.  We discussed treatment options.  She is interested in following up with GYN and I gave recommendations.  In the meantime, we will have her start Estrace vaginal cream.  Orders: -     estradiol (ESTRACE) 0.1 MG/GM vaginal cream; 0.5 g intravaginally 1-3 times per week.  Screening for breast cancer -     MM SCREENING BREAST TOMO BILATERAL; Future  Lipid screening Comments: Patient's lipid panel shows an HDL that is exceptionally high.  She is congratulated on this. Orders: -     Lipid panel   . Reviewed expectations re: course of current medical issues. . Discussed self-management of symptoms. . Outlined signs and symptoms indicating need for more acute intervention. . Patient verbalized understanding and all questions were answered. Marland Kitchen Health Maintenance issues including appropriate  healthy diet, exercise, and smoking avoidance were discussed with patient. . See orders for this visit as documented in the electronic medical record. . Patient received an After Visit Summary.  Briscoe Deutscher, DO [place], [place] 10/23/2017  No future appointments.

## 2017-10-27 ENCOUNTER — Other Ambulatory Visit: Payer: Self-pay | Admitting: Family Medicine

## 2017-10-27 DIAGNOSIS — Z1231 Encounter for screening mammogram for malignant neoplasm of breast: Secondary | ICD-10-CM

## 2017-10-29 ENCOUNTER — Ambulatory Visit: Payer: Self-pay

## 2017-10-29 ENCOUNTER — Ambulatory Visit (INDEPENDENT_AMBULATORY_CARE_PROVIDER_SITE_OTHER): Payer: Medicare Other | Admitting: Sports Medicine

## 2017-10-29 ENCOUNTER — Encounter: Payer: Self-pay | Admitting: Sports Medicine

## 2017-10-29 VITALS — BP 116/68 | HR 72 | Ht 63.5 in | Wt 108.6 lb

## 2017-10-29 DIAGNOSIS — M7061 Trochanteric bursitis, right hip: Secondary | ICD-10-CM

## 2017-10-29 DIAGNOSIS — M96842 Postprocedural seroma of a musculoskeletal structure following a musculoskeletal system procedure: Secondary | ICD-10-CM

## 2017-10-29 DIAGNOSIS — M25551 Pain in right hip: Secondary | ICD-10-CM | POA: Diagnosis not present

## 2017-10-29 MED ORDER — TRAMADOL HCL 50 MG PO TABS: 50.0000 mg | ORAL_TABLET | Freq: Four times a day (QID) | ORAL | 0 refills | Status: DC | PRN

## 2017-10-29 NOTE — Procedures (Signed)
LIMITED MSK ULTRASOUND OF right hip Images were obtained and interpreted by myself, Teresa Coombs, DO  Images have been saved and stored to PACS system. Images obtained on: GE S7 Ultrasound machine  FINDINGS:   Small seroma and small amount of bursal swelling that is improved compared in the past but still present.  She has chronic thickening and Tendinopathic changes of the glute medius tendon and possible small interstitial tear.  IMPRESSION:  1. Chronic trochanteric bursitis/seroma 2. 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 Postsurgical changes of the greater trochanter

## 2017-10-29 NOTE — Progress Notes (Addendum)
Megan Taylor. Megan Taylor, [place] at [facility] [place] - 72 y.o. female MRN [id number]  Date of birth: 1946/05/13  Visit Date: 10/29/2017  PCP: Briscoe Deutscher, DO   Referred by: Briscoe Deutscher, DO   Scribe for today's visit: Josepha Pigg, CMA     SUBJECTIVE:  Megan Taylor is here for Follow-up (RT hip pain)  Compared to the last office visit, her previously described symptoms are worsening, sx began to flare-up again in early 09/2017.  Current symptoms are moderate and described as stinging, grabbing, stabbing, aching & are radiating to top of RT thigh. She denies groin pain. Pain is present now when she sleeps on RT or LT side and when sleeping on her back. At times she has pain with every step. At times the hip is tender to palpation.  She has been using Nitroglycerin patches.   She had hip surgery 2 years ago in May and the soreness seems to be right along the line where the incision was.    ROS Reports night time disturbances. Denies fevers, chills, or night sweats. Denies unexplained weight loss. Denies personal history of cancer. Denies changes in bowel or bladder habits. Denies recent unreported falls. Denies new or worsening dyspnea or wheezing. Denies headaches or dizziness.  Denies numbness, tingling or weakness  In the extremities.  Denies dizziness or presyncopal episodes Denies lower extremity edema     HISTORY & PERTINENT PRIOR DATA:  Prior History reviewed and updated per electronic medical record.  Significant history, findings, studies and interim changes include:  reports that  has never smoked. she has never used smokeless tobacco. No results for input(s): HGBA1C, LABURIC, CREATINE in the last 8760 hours. No specialty comments available. No problems updated.  OBJECTIVE:  VS:  HT:5' 3.5" (161.3 cm)   WT:108 lb 9.6 oz (49.3 kg)  BMI:18.93    BP:116/68  HR:72bpm  TEMP: ( )  RESP:98 %     PHYSICAL EXAM: Constitutional: WDWN, Non-toxic appearing. Psychiatric: Alert & appropriately interactive.  Not depressed or anxious appearing. Respiratory: No increased work of breathing.  Trachea Midline Eyes: Pupils are equal.  EOM intact without nystagmus.  No scleral icterus  NEUROVASCULAR exam: No clubbing or cyanosis appreciated No significant venous stasis changes Capillary Refill: normal, less than 2 seconds    LOWER Extremities  SWELLING Pre-tibial edema: No significant pretibial edema  PULSES Pedal Pulses: Normal & symmetrically palpable  SENSORY Dermatomes intact to light touch  MOTOR Normal strength in all myotomes  REFLEXES   Marked TTP over the greater trochanter and glute medius musculature.  There is no palpable defect.  Postsurgical incision is well-healed.  She has pain along the proximal IT band as well.  Good internal and external rotation of bilateral hips.  Negative straight leg raise bilaterally.  She is able to heel and toe walk without difficulty.  Overall remarkably improved hip abduction recruitment pattern with improved glute medius recruitment however persistent T FL predominance.  No additional findings.   ASSESSMENT & PLAN:   1. Right hip pain   2. Greater trochanteric bursitis of right hip   3. Seroma of musculoskeletal structure after musculoskeletal system procedure    PLAN: Chronic trochanteric tendinopathy with underlying seroma and bursitis that has been straight to multiple invasive and noninvasive procedures including corticosteroid injections and even open bursectomy with Dr. Ricki Rodriguez.  Given the ongoing symptoms and recurrent amount of swelling and possible small interstitial splitting  of the hip abductor tendons further treatment with PRP discussed in detail with the patient she would like to pursue this.  She will follow-up next week and has been off of anti-inflammatories for several months.  She should continue with compression and icing in the  interim and we will go ahead and call in a prescription for tramadol for her to try in the interim.  Otherwise we can plan to have her take a narcotic pain medicine if indicated at follow-up as she has had lack of improvement the past with tramadol.    No problem-specific Assessment & Plan notes found for this encounter.  ++++++++++++++++++++++++++++++++++++++++++++ Orders & Meds: Orders Placed This Encounter  Procedures  . [place] LIMITED JOINT SPACE STRUCTURES LOW RIGHT(NO LINKED CHARGES)    Meds ordered this encounter  Medications  . traMADol (ULTRAM) 50 MG tablet    Sig: Take 1 tablet (50 mg total) by mouth every 6 (six) hours as needed for moderate pain.    Dispense:  30 tablet    Refill:  0    For chronic condition -    ++++++++++++++++++++++++++++++++++++++++++++ Follow-up: Follow-up next week for PRP injection.  Pertinent documentation may be included in additional procedure notes, imaging studies, problem based documentation and patient instructions. Please see these sections of the encounter for additional information regarding this visit. CMA/ATC served as Education administrator during this visit. History, Physical, and Plan performed by medical provider. Documentation and orders reviewed and attested to.      Gerda Diss, [place] [facility] Physician

## 2017-10-29 NOTE — Patient Instructions (Addendum)
We are going to be setting you up to perform a PRP (platelet rich plasma) injection. The cost of this is $495 out of pocket.  We will plan to have you come back at your convenience.  You will need to be off of anti-inflammatories for approximately 2 weeks prior to the injection and continue to avoid these medications (ibuprofen, Aleeve(naproxen), Meloxicam(mobic), Voltaren(diclofenac) during the initial 6 weeks following your injection. It is okay to take Tylenol if needed.  Please go ahead and fill your tramadol prescription so you have it available for after the procedure.  We will call you to set up an appointment time for next week.

## 2017-11-02 ENCOUNTER — Encounter: Payer: Self-pay | Admitting: Sports Medicine

## 2017-11-02 ENCOUNTER — Ambulatory Visit: Payer: Self-pay

## 2017-11-02 ENCOUNTER — Ambulatory Visit (INDEPENDENT_AMBULATORY_CARE_PROVIDER_SITE_OTHER): Payer: Self-pay | Admitting: Sports Medicine

## 2017-11-02 VITALS — BP 102/72 | HR 64 | Ht 63.5 in | Wt 106.8 lb

## 2017-11-02 DIAGNOSIS — M7061 Trochanteric bursitis, right hip: Secondary | ICD-10-CM

## 2017-11-02 NOTE — Progress Notes (Signed)
  Megan Taylor. Taylor, Megan at [facility] [place] - 72 y.o. female MRN [id number]  Date of birth: [birth year]  Visit Date: 11/02/2017  PCP: Briscoe Deutscher, DO   Referred by: Briscoe Deutscher, DO   Scribe for today's visit: Josepha Pigg, CMA     SUBJECTIVE:  Megan Taylor is here for Follow-up (RT hip pain)  Compared to the last office visit, her previously described symptoms of RT hip/gluteal pain are: show no change  Current symptoms are moderate & are radiating to top of RT thigh She has been using Nitro patches and was prescribed Tramadol prn.    ROS Reports night time disturbances. Denies fevers, chills, or night sweats. Denies unexplained weight loss. Denies personal history of cancer. Denies changes in bowel or bladder habits. Denies recent unreported falls. Denies new or worsening dyspnea or wheezing. Denies headaches or dizziness.  Denies numbness, tingling or weakness  In the extremities.  Denies dizziness or presyncopal episodes Denies lower extremity edema     HISTORY & PERTINENT PRIOR DATA:  Prior History reviewed and updated per electronic medical record.  Significant history, findings, studies and interim changes include:  reports that  has never smoked. she has never used smokeless tobacco. No results for input(s): HGBA1C, LABURIC, CREATINE in the last 8760 hours. No specialty comments available. No problems updated.  OBJECTIVE:  VS:  HT:5' 3.5" (161.3 cm)   WT:106 lb 12.8 oz (48.4 kg)  BMI:18.62    BP:102/72  HR:64bpm  TEMP: ( )  RESP:98 %   PHYSICAL EXAM: Constitutional: WDWN, Non-toxic appearing. Psychiatric: Alert & appropriately interactive.  Not depressed or anxious appearing. Respiratory: No increased work of breathing.  Trachea Midline Eyes: Pupils are equal.  EOM intact without nystagmus.  No scleral icterus  Moderate TTP over the greater trochanter.  There is marked  thickening on the ultrasound per procedure note.    ASSESSMENT & PLAN:   1. Greater trochanteric bursitis of right hip    PLAN: PRP injection performed today per procedure note.  This is a cash only procedure and the patient is aware of this   we will have her significantly reduce her physical activity over the next 2 weeks.  She has a prescription for tramadol as needed.  Recommend avoiding NSAIDs and using frequent icing at this time.  Tylenol as needed.  ++++++++++++++++++++++++++++++++++++++++++++  Follow-up: Return in about 2 weeks (around 11/16/2017).  We will plan to discuss increasing her physical activity as tolerated.  Pertinent documentation may be included in additional procedure notes, imaging studies, problem based documentation and patient instructions. Please see these sections of the encounter for additional information regarding this visit. CMA/ATC served as Education administrator during this visit. History, Physical, and Plan performed by medical provider. Documentation and orders reviewed and attested to.      Gerda Diss, [place] [facility] Physician

## 2017-11-02 NOTE — Patient Instructions (Signed)

## 2017-11-02 NOTE — Procedures (Signed)
PROCEDURE NOTE:  Ultrasound Guided: Injection: Right greater trochanteric PRP injection Images were obtained and interpreted by myself, Teresa Coombs, DO  Images have been saved and stored to PACS system. Images obtained on: GE S7 Ultrasound machine  ULTRASOUND FINDINGS:  Markedly thickened glute medius tendon insertion with postsurgical changes and very small superficial bursal layer.   DESCRIPTION OF PROCEDURE:  The patient's clinical condition is marked by substantial pain and/or significant functional disability. Other conservative therapy has not provided relief, is contraindicated, or not appropriate. There is a reasonable likelihood that injection will significantly improve the patient's pain and/or functional impairment.  After discussing the risks, benefits and expected outcomes of the injection and all questions were reviewed and answered, the patient wished to undergo the above named procedure. Verbal consent was obtained.  The ultrasound was used to identify the target structure and adjacent neurovascular structures. The skin was then prepped in sterile fashion and the target structure was injected under direct visualization using sterile technique as below:  PREP: Alcohol, Ethel Chloride,   APPROACH: direct, stopcock technique, 21g 2in. INJECTATE: 1 cc total of a solution of 2 cc 1% lidocaine, 2cc 0.5% marcaine, 3cc of PRP using the peak PRP kit following manufacturing instructions including drawing 27 cc of blood and using appropriate sterile technique to centrifuge down to a total of 3 cc of 7 time concentration of PRP. ASPIRATE: None   DRESSING: Band-Aid  Post procedural instructions including recommending icing and warning signs for infection were reviewed.  This procedure was well tolerated and there were no complications.   IMPRESSION: Succesful Ultrasound Guided: Injection

## 2017-11-08 ENCOUNTER — Ambulatory Visit (INDEPENDENT_AMBULATORY_CARE_PROVIDER_SITE_OTHER): Payer: Medicare Other | Admitting: Psychology

## 2017-11-08 DIAGNOSIS — F320 Major depressive disorder, single episode, mild: Secondary | ICD-10-CM | POA: Diagnosis not present

## 2017-11-16 ENCOUNTER — Ambulatory Visit (INDEPENDENT_AMBULATORY_CARE_PROVIDER_SITE_OTHER): Payer: Medicare Other | Admitting: Sports Medicine

## 2017-11-16 ENCOUNTER — Ambulatory Visit: Payer: Self-pay

## 2017-11-16 ENCOUNTER — Encounter: Payer: Self-pay | Admitting: Sports Medicine

## 2017-11-16 VITALS — BP 102/68 | HR 76 | Ht 63.5 in | Wt 106.2 lb

## 2017-11-16 DIAGNOSIS — M7061 Trochanteric bursitis, right hip: Secondary | ICD-10-CM

## 2017-11-16 DIAGNOSIS — M6798 Unspecified disorder of synovium and tendon, other site: Secondary | ICD-10-CM | POA: Diagnosis not present

## 2017-11-16 DIAGNOSIS — M67951 Unspecified disorder of synovium and tendon, right thigh: Secondary | ICD-10-CM

## 2017-11-16 MED ORDER — NITROGLYCERIN 0.2 MG/HR TD PT24: MEDICATED_PATCH | TRANSDERMAL | 1 refills | Status: DC

## 2017-11-16 NOTE — Patient Instructions (Signed)
I am glad you are doing so well.  Continue using your Spankz on a daily basis.  I am okay to begin gentle walking but limit yourself to no more than a mile.  I am okay with you working with Alfonse Spruce going forward as well as long as you avoid working out the hip are doing body related exercises. Gentle isometric back exercises are fine to do as well as knee extensions in the leg extensions and leg curls.

## 2017-11-16 NOTE — Progress Notes (Signed)
Megan Taylor. Megan Taylor, [place] at [facility] - 72 y.o. female MRN [id number]  Date of birth: 24-Aug-1946  Visit Date: 11/16/2017  PCP: Briscoe Deutscher, DO   Referred by: Briscoe Deutscher, DO   Scribe for today's visit: Megan Taylor, CMA     SUBJECTIVE:  Megan Taylor is here for Follow-up (greater trochanteric bursitis RT hip)  Compared to the last office visit, her previously described symptoms of RT hip pain:  are improving, significant improvement over the past 2-3 days. She has noticed sciatic nerve pain since inj, she has hx of sciatic nerve pain.  Current symptoms are mild & are radiating to RT leg.  She had PRP injection 11/02/2017 and tolerated well. She had previously tried taking Tramadol prn for the pain. She was prescribed Nitroglycerin patches and advised to continue following Nitro Protocol.    ROS Reports night time disturbances, sciatica. Denies fevers, chills, or night sweats. Denies unexplained weight loss. Denies personal history of cancer. Denies changes in bowel or bladder habits. Denies recent unreported falls. Denies new or worsening dyspnea or wheezing. Denies headaches or dizziness.  Denies numbness, tingling or weakness  In the extremities.  Denies dizziness or presyncopal episodes Denies lower extremity edema     HISTORY & PERTINENT PRIOR DATA:  Prior History reviewed and updated per electronic medical record.  Significant history, findings, studies and interim changes include:  reports that  has never smoked. she has never used smokeless tobacco. No results for input(s): HGBA1C, LABURIC, CREATINE in the last 8760 hours. No specialty comments available. Problem  Tendinopathy of Right Gluteus Medius    OBJECTIVE:  VS:  HT:5' 3.5" (161.3 cm)   WT:106 lb 3.2 oz (48.2 kg)  BMI:18.52    BP:102/68  HR:76bpm  TEMP: ( )  RESP:98 %   PHYSICAL EXAM: Constitutional: WDWN, Non-toxic  appearing. Psychiatric: Alert & appropriately interactive.  Not depressed or anxious appearing. Respiratory: No increased work of breathing.  Trachea Midline Eyes: Pupils are equal.  EOM intact without nystagmus.  No scleral icterus  NEUROVASCULAR exam: No clubbing or cyanosis appreciated No significant venous stasis changes Capillary Refill: normal, less than 2 seconds   Right hip is overall well aligned.  She has minimal pain over the greater trochanter on the right compared to in the past.  Well-healed postsurgical incision. No additional findings.   ASSESSMENT & PLAN:   1. Greater trochanteric bursitis of right hip   2. Tendinopathy of right gluteus medius    PLAN:    Tendinopathy of right gluteus medius This is markedly improved.  She is having great improvement in her pain control even at 2 weeks status post PRP injection.  We discussed 6 weeks of generalized rest is critical for the healing but I will allow her to begin to walk gently limiting herself to no more than a mile at a time.  She is having some worsening right buttock and sciatic type symptoms that are minimal and she attributes this to her immobility.  Cautioned on increasing her activity too fast and recommend continued icing and Spanks.  Continue with nitroglycerin protocol as well.    ++++++++++++++++++++++++++++++++++++++++++++ Orders & Meds: Orders Placed This Encounter  Procedures  . [place] MSK POCT ULTRASOUND    Meds ordered this encounter  Medications  . nitroGLYCERIN (NITRODUR - DOSED IN MG/24 HR) 0.2 mg/hr patch    Sig: PLACE 1/4 PATCH OVER AFFECTED REGION REMOVE AND REPLACE  ONCE DAILY SLIGHTLY ALTER SKIN PLACEMENT    Dispense:  30 patch    Refill:  1    ++++++++++++++++++++++++++++++++++++++++++++ Follow-up: Return in about 2 weeks (around 11/30/2017).   Pertinent documentation may be included in additional procedure notes, imaging studies, problem based documentation and patient instructions. Please see  these sections of the encounter for additional information regarding this visit. CMA/ATC served as Education administrator during this visit. History, Physical, and Plan performed by medical provider. Documentation and orders reviewed and attested to.      Gerda Diss, [place] [facility] Physician

## 2017-11-16 NOTE — Assessment & Plan Note (Signed)
This is markedly improved.  She is having great improvement in her pain control even at 2 weeks status post PRP injection.  We discussed 6 weeks of generalized rest is critical for the healing but I will allow her to begin to walk gently limiting herself to no more than a mile at a time.  She is having some worsening right buttock and sciatic type symptoms that are minimal and she attributes this to her immobility.  Cautioned on increasing her activity too fast and recommend continued icing and Spanks.  Continue with nitroglycerin protocol as well.

## 2017-11-16 NOTE — Procedures (Signed)
LIMITED MSK ULTRASOUND OF right hip Images were obtained and interpreted by myself, Teresa Coombs, DO  Images have been saved and stored to PACS system. Images obtained on: GE S7 Ultrasound machine  FINDINGS:   Overall improved echotexture of the glute insertion over the greater trochanter.  There is a very small persistent bursal layer but this is minimal.  There is improved neovascularity within the insertion of the glue tendons.  IMPRESSION:  1. Improving greater trochanteric tendinopathy with improved neovascularity following PRP injection 2. Small persistent bursa/seroma, improved

## 2017-11-23 ENCOUNTER — Ambulatory Visit
Admission: RE | Admit: 2017-11-23 | Discharge: 2017-11-23 | Disposition: A | Discharge status: Home / Self Care | Payer: Medicare Other | Source: Ambulatory Visit | Attending: Family Medicine | Admitting: Family Medicine

## 2017-11-23 DIAGNOSIS — Z1231 Encounter for screening mammogram for malignant neoplasm of breast: Secondary | ICD-10-CM | POA: Diagnosis not present

## 2017-11-25 ENCOUNTER — Ambulatory Visit (INDEPENDENT_AMBULATORY_CARE_PROVIDER_SITE_OTHER): Payer: Medicare Other | Admitting: Psychology

## 2017-11-25 DIAGNOSIS — F320 Major depressive disorder, single episode, mild: Secondary | ICD-10-CM | POA: Diagnosis not present

## 2017-11-30 ENCOUNTER — Ambulatory Visit: Payer: Self-pay

## 2017-11-30 ENCOUNTER — Ambulatory Visit (INDEPENDENT_AMBULATORY_CARE_PROVIDER_SITE_OTHER): Payer: Medicare Other | Admitting: Sports Medicine

## 2017-11-30 ENCOUNTER — Encounter: Payer: Self-pay | Admitting: Sports Medicine

## 2017-11-30 VITALS — BP 104/80 | HR 73 | Ht 63.5 in | Wt 107.8 lb

## 2017-11-30 DIAGNOSIS — M7061 Trochanteric bursitis, right hip: Secondary | ICD-10-CM

## 2017-11-30 NOTE — Progress Notes (Signed)
Megan Taylor. Megan Taylor, [name] - 72 y.o. female MRN [id number]  Date of birth: 11/06/1945  Visit Date: 11/30/2017  PCP: Briscoe Deutscher, DO   Referred by: Briscoe Deutscher, DO   Scribe for today's visit: Josepha Pigg, CMA     SUBJECTIVE:  Megan Taylor is here for Follow-up (greater trochanteric bursitis R hip)  11/16/17: Compared to the last office visit, her previously described symptoms of RT hip pain:  are improving, significant improvement over the past 2-3 days. She has noticed sciatic nerve pain since inj, she has hx of sciatic nerve pain.  Current symptoms are mild & are radiating to RT leg.  She had PRP injection 11/02/2017 and tolerated well. She had previously tried taking Tramadol prn for the pain. She was prescribed Nitroglycerin patches and advised to continue following Nitro Protocol.   11/30/17: Compared to the last office visit, her previously described symptoms are improving, night time sciatica is improving. She has been able to sleep without waking up d/t pain for the past 4 nights.  Current symptoms are mild & are radiating to R leg.  She had PRP injection 11/02/17 and responded well. She continues to ice the hip. She is following Nitro Protocol and reports not side effects.    ROS Reports night time disturbances. Denies fevers, chills, or night sweats. Denies unexplained weight loss. Denies personal history of cancer. Denies changes in bowel or bladder habits. Denies recent unreported falls. Denies new or worsening dyspnea or wheezing. Denies headaches or dizziness.  Reports numbness, tingling or weakness  In the extremities.  Denies dizziness or presyncopal episodes Denies lower extremity edema     HISTORY & PERTINENT PRIOR DATA:  Prior History reviewed and updated per electronic medical record.  Significant history, findings, studies and interim changes include:  reports  that  has never smoked. she has never used smokeless tobacco. No results for input(s): HGBA1C, LABURIC, CREATINE in the last 8760 hours. No specialty comments available. No problems updated.  OBJECTIVE:  VS:  HT:5' 3.5" (161.3 cm)   WT:107 lb 12.8 oz (48.9 kg)  BMI:18.79    BP:104/80  HR:73bpm  TEMP: ( )  RESP:98 %   PHYSICAL EXAM: Constitutional: WDWN, Non-toxic appearing. Psychiatric: Alert & appropriately interactive.  Not depressed or anxious appearing. Respiratory: No increased work of breathing.  Trachea Midline Eyes: Pupils are equal.  EOM intact without nystagmus.  No scleral icterus  NEUROVASCULAR exam: No clubbing or cyanosis appreciated No significant venous stasis changes Capillary Refill: normal, less than 2 seconds   Right hip overall well aligned without significant tenderness to palpation over the gluteal tendons or the greater trochanter.  Well-healed postsurgical incision.  Hip abduction strength is improving.  Persistent TFL predominant recruitment pattern.   ASSESSMENT & PLAN:   1. Greater trochanteric bursitis of right hip    ++++++++++++++++++++++++++++++++++++++++++++ Orders & Meds:  Orders Placed This Encounter  Procedures  . [place] MSK POCT ULTRASOUND   No orders of the defined types were placed in this encounter.   ++++++++++++++++++++++++++++++++++++++++++++ PLAN:   Findings:  She is doing remarkably well.  She has almost no pain at this time.  I would like for her to begin increasing her activities including walking and generalized strengthening activities with her personal trainer.  Still cautious for the next 2 weeks with healing.  Continue with compression.   No problem-specific Assessment & Plan notes found for this encounter.  Follow-up: Return in about 2 weeks (around 12/14/2017).   Pertinent documentation may be included in additional procedure notes, imaging studies, problem based documentation and patient instructions. Please see these  sections of the encounter for additional information regarding this visit. CMA/ATC served as Education administrator during this visit. History, Physical, and Plan performed by medical provider. Documentation and orders reviewed and attested to.      Gerda Diss, [place] Sports Medicine Physician

## 2017-11-30 NOTE — Patient Instructions (Signed)
Please perform the exercise program that we have prepared for you and gone over in detail on a daily basis.  In addition to the handout you were provided you can access your program through: [email or web address]   Your unique program code is: [email or web address]

## 2017-11-30 NOTE — Procedures (Signed)
LIMITED MSK ULTRASOUND OF RIGHT GREATER TROCANTER Images were obtained and interpreted by myself, Teresa Coombs, DO  Images have been saved and stored to PACS system. Images obtained on: GE S7 Ultrasound machine  FINDINGS:   Improved fibrillar structure of the glute minimus and medius  Small persistent bursal/seroma swelling at superior margin of greater trochanter  Persistent suture material with improved surrounding echo texture  IMPRESSION:  1. Improving hip / glute  Tendon tendinopathy with persistent small bursa/seroma s/p PRP 4 weeks ago

## 2017-12-06 ENCOUNTER — Encounter: Payer: Self-pay | Admitting: Sports Medicine

## 2017-12-06 NOTE — Procedures (Signed)
PROCEDURE NOTE: THERAPEUTIC EXERCISES (97110) 15 minutes spent for Therapeutic exercises as below and as referenced in the AVS. This included exercises focusing on stretching, strengthening, with significant focus on eccentric aspects.  Proper technique shown and discussed handout in great detail with ATC. All questions were discussed and answered.   Long term goals include an improvement in range of motion, strength, endurance as well as avoiding reinjury. Frequency of visits is one time as determined during today's  office visit. Frequency of exercises to be performed is as per handout.  EXERCISES REVIEWED:  Hip abduction strengthening  Core conditioning

## 2017-12-07 ENCOUNTER — Encounter: Payer: Self-pay | Admitting: Sports Medicine

## 2017-12-09 ENCOUNTER — Ambulatory Visit (INDEPENDENT_AMBULATORY_CARE_PROVIDER_SITE_OTHER): Payer: Medicare Other | Admitting: Psychology

## 2017-12-09 DIAGNOSIS — F320 Major depressive disorder, single episode, mild: Secondary | ICD-10-CM | POA: Diagnosis not present

## 2017-12-14 ENCOUNTER — Encounter: Payer: Self-pay | Admitting: Sports Medicine

## 2017-12-14 ENCOUNTER — Ambulatory Visit (INDEPENDENT_AMBULATORY_CARE_PROVIDER_SITE_OTHER): Payer: Medicare Other | Admitting: Sports Medicine

## 2017-12-14 ENCOUNTER — Ambulatory Visit: Payer: Self-pay

## 2017-12-14 VITALS — BP 112/80 | HR 75 | Ht 63.5 in | Wt 108.4 lb

## 2017-12-14 DIAGNOSIS — M7061 Trochanteric bursitis, right hip: Secondary | ICD-10-CM

## 2017-12-14 DIAGNOSIS — M6798 Unspecified disorder of synovium and tendon, other site: Secondary | ICD-10-CM | POA: Diagnosis not present

## 2017-12-14 DIAGNOSIS — M67951 Unspecified disorder of synovium and tendon, right thigh: Secondary | ICD-10-CM

## 2017-12-14 NOTE — Assessment & Plan Note (Signed)
Overall she has been doing quite well and is having almost no pain at this time.  She has been diligent with wearing compression, using ice and performing therapeutic exercises as prescribed.  She is at the 6-week mark status post PRP injections and reports the best relief that she has had in years.  She has even been able to sleep on her right side at night without significant exacerbations in her symptoms.  We will have her begin increasing her activity as tolerated including adding an additional glute medius strengthening exercises including step ups that caused a small amount of difficulty when she performed 7-10 of these.  We will have her limit these activities until tolerability and plan to check in with her in 4 weeks with repeat ultrasound.

## 2017-12-14 NOTE — Patient Instructions (Signed)
Restart the step over excises. Okay for any non-explosive exercises for hip and Lower Extremities Compression with exercise only. Limit ANY activity to 2-3/10 during.  If you are more sore after a specific exercise you should cut back on how frequently you are doing that activity

## 2017-12-14 NOTE — Progress Notes (Addendum)
Megan Taylor at [facility] - 72 y.o. female MRN [id number]  Date of birth: 02/26/1946  Visit Date: 12/14/2017  PCP: Briscoe Deutscher, DO   Referred by: Briscoe Deutscher, DO   Scribe for today's visit: Wendy Poet, LAT, ATC     SUBJECTIVE:  Megan Taylor is here for Follow-up (R hip greater trochanteric bursitis) .   04/21/17: Pt presents today in follow-up of right hip pain. She had u/s done 03/10/17 which showed the following:L IMPRESSION:  Chronic glute medius and piriformis tendinopathy in the setting of chronic bursitis versus postsurgical seroma. She was advised to wear a Body Helix hip compression sleeve during activity and as needed for therapeutic purposes. She was also to continue following the Nitro Protocol.  In regards to her lower back pain she was advised to continue core strengthening exercises.  Pt got the Body Helix sleeve but says that she couldn't find it in a size that was small enough for her so she has been wearing Spanx and tight yoga pants. She reports that she is having some relief with the compression she receives by doing this. She is still following Nitro Protocol. She reports that her hip pain is "much better" overall since her last visit. She still has a little bit of pain but its not keeping her up at night anymore. Some nights she can even sleep on the right side which she hasn't been able to do for years. Most of the time she only has pain when she over does it.  Pt reports that lower back pain has improved. She has been seeing a Physiological scientist a couple of times a week and doing core strengthening exercises.  Pt denies fever, chills, night sweats.   06/30/17: Megan Taylor is an established patient presenting today in follow-up of tendinopathy of the RT gluteus medius and lumbar spondylosis. She was last seen 04/21/2017 and advise to continue use of Body Helix hip spica and  Nitroglycerin patches.  Pt reports that she tried the Body Helix but was unable to find it in her size she she has been wearing Spanx and reports improvement with pain while wearing this. She has been using Nitroglycerin patches with no side effects. She feels that she was doing better a couple of months ago than she is doing now. She is starting with wake from sleep again d/t the pain. Pain is still in the RT gluteal muscle and radiates into the RT leg. Pain is worse when she sleeps on her LT side but she cannot sleep on her back d/t back pain. She has tried taking Advil for the pain but NSAIDs cause GI upset so she doesn't take them often. She says that back in June she was able to sleep on the RT side for a little while but now she cannot ly on her RT side for 5 minutes before the pain starts. She has been working with a Physiological scientist who has been helping her with stretching and upper body strength. She has noticed that when she does a lot of upper body workouts she develops neck pain and HA.    10/29/17: Compared to the last office visit, her previously described symptoms are worsening, sx began to flare-up again in early 09/2017.  Current symptoms are moderate and described as stinging, grabbing, stabbing, aching & are radiating to top of RT thigh. She denies groin pain. Pain is present now  when she sleeps on RT or LT side and when sleeping on her back. At times she has pain with every step. At times the hip is tender to palpation.  She has been using Nitroglycerin patches.  She had hip surgery 2 years ago in May and the soreness seems to be right along the line where the incision was.  11/02/17: Compared to the last office visit, her previously described symptoms of RT hip/gluteal pain are: show no change  Current symptoms are moderate & are radiating to top of RT thigh She has been using Nitro patches and was prescribed Tramadol prn.  11/16/17: Compared to the last office visit, her previously  described symptoms of RT hip pain:  are improving, significant improvement over the past 2-3 days. She has noticed sciatic nerve pain since inj, she has hx of sciatic nerve pain.  Current symptoms are mild & are radiating to RT leg.  She had PRP injection 11/02/2017 and tolerated well. She had previously tried taking Tramadol prn for the pain. She was prescribed Nitroglycerin patches and advised to continue following Nitro Protocol.   11/30/17: Compared to the last office visit, her previously described symptoms are improving, night time sciatica is improving. She has been able to sleep without waking up d/t pain for the past 4 nights.  Current symptoms are mild & are radiating to R leg.  She had PRP injection 11/02/17 and responded well. She continues to ice the hip. She is following Nitro Protocol and reports not side effects.   12/14/17: Compared to the last office visit on 11/30/17, her previously described R hip pain symptoms are improving w/ little to no pain during the day and she is now able to sleep at night.  She states that she did 30 min worth of yard work and that made her glutes sore but not her R hip.  Pt states that she has resumed walking on a TM and that she has gotten up to 20 min. Current symptoms are mild & are nonradiating She has been using her nitro patches, doing HEP consisting of sidelying hip aBd and had a PRP injection on 11/02/17.   ROS Denies night time disturbances. Denies fevers, chills, or night sweats. Denies unexplained weight loss. Denies personal history of cancer. Denies changes in bowel or bladder habits. Denies recent unreported falls. Denies new or worsening dyspnea or wheezing. Denies headaches or dizziness.  Denies numbness, tingling or weakness  In the extremities.  Denies dizziness or presyncopal episodes Denies lower extremity edema    HISTORY & PERTINENT PRIOR DATA:  Prior History reviewed and updated per electronic medical record.    Significant history, findings, studies and interim changes include:  reports that  has never smoked. she has never used smokeless tobacco. No results for input(s): HGBA1C, LABURIC, CREATINE in the last 8760 hours. No specialty comments available. Problem  Tendinopathy of Right Gluteus Medius    OBJECTIVE:  VS:  HT:5' 3.5" (161.3 cm)   WT:108 lb 6.4 oz (49.2 kg)  BMI:18.9    BP:112/80  HR:75bpm  TEMP: ( )  RESP:96 %   PHYSICAL EXAM: Constitutional: WDWN, Non-toxic appearing. Psychiatric: Alert & appropriately interactive.  Not depressed or anxious appearing. Respiratory: No increased work of breathing.  Trachea Midline Eyes: Pupils are equal.  EOM intact without nystagmus.  No scleral icterus  NEUROVASCULAR exam: No clubbing or cyanosis appreciated No significant venous stasis changes Capillary Refill: normal, less than 2 seconds   Right hip is well aligned.  She has no pain with palpation of the glute medius tendons greater trochanter or ATFL.  Her hip abduction strength is 4 out of 5 still but improved with improved recruitment of the glute medius tendon with side-lying hip abduction.  Hip rotation is normal.  No significant swelling appreciated with palpation.   ASSESSMENT & PLAN:   1. Greater trochanteric bursitis of right hip   2. Tendinopathy of right gluteus medius    ++++++++++++++++++++++++++++++++++++++++++++ Orders & Meds:  No orders of the defined types were placed in this encounter.  No orders of the defined types were placed in this encounter.   ++++++++++++++++++++++++++++++++++++++++++++ PLAN:   No additional findings.  Tendinopathy of right gluteus medius Overall she has been doing quite well and is having almost no pain at this time.  She has been diligent with wearing compression, using ice and performing therapeutic exercises as prescribed.  She is at the 6-week mark status post PRP injections and reports the best relief that she has had in years.   She has even been able to sleep on her right side at night without significant exacerbations in her symptoms.  We will have her begin increasing her activity as tolerated including adding an additional glute medius strengthening exercises including step ups that caused a small amount of difficulty when she performed 7-10 of these.  We will have her limit these activities until tolerability and plan to check in with her in 4 weeks with repeat ultrasound.   Follow-up: Return in about 4 weeks (around 01/11/2018).   Pertinent documentation may be included in additional procedure notes, imaging studies, problem based documentation and patient instructions. Please see these sections of the encounter for additional information regarding this visit. CMA/ATC served as Education administrator during this visit. History, Physical, and Plan performed by medical provider. Documentation and orders reviewed and attested to.      Gerda Diss, [place] [facility] Physician

## 2018-01-04 ENCOUNTER — Ambulatory Visit (INDEPENDENT_AMBULATORY_CARE_PROVIDER_SITE_OTHER): Payer: Medicare Other | Admitting: Psychology

## 2018-01-04 DIAGNOSIS — F320 Major depressive disorder, single episode, mild: Secondary | ICD-10-CM | POA: Diagnosis not present

## 2018-01-11 ENCOUNTER — Ambulatory Visit: Payer: Self-pay

## 2018-01-11 ENCOUNTER — Ambulatory Visit (INDEPENDENT_AMBULATORY_CARE_PROVIDER_SITE_OTHER): Payer: Medicare Other | Admitting: Sports Medicine

## 2018-01-11 ENCOUNTER — Encounter: Payer: Self-pay | Admitting: Sports Medicine

## 2018-01-11 VITALS — BP 102/70 | HR 66 | Ht 63.5 in | Wt 108.8 lb

## 2018-01-11 DIAGNOSIS — M7061 Trochanteric bursitis, right hip: Secondary | ICD-10-CM

## 2018-01-11 DIAGNOSIS — M6798 Unspecified disorder of synovium and tendon, other site: Secondary | ICD-10-CM

## 2018-01-11 DIAGNOSIS — M67951 Unspecified disorder of synovium and tendon, right thigh: Secondary | ICD-10-CM

## 2018-01-11 NOTE — Progress Notes (Signed)
Megan Taylor. Megan Taylor, [place] at [facility] - 72 y.o. female MRN [id number]  Date of birth: 23-Mar-1946  Visit Date: 01/11/2018  PCP: Briscoe Deutscher, DO   Referred by: Briscoe Deutscher, DO  Scribe for today's visit: Josepha Pigg, CMA     SUBJECTIVE:  Megan Taylor is here for Follow-up (R hip pain)  04/21/17: Pt presents today in follow-up of right hip pain. She had u/s done 03/10/17 which showed the following:L IMPRESSION:  Chronic glute medius and piriformis tendinopathy in the setting of chronic bursitis versus postsurgical seroma. She was advised to wear a Body Helix hip compression sleeve during activity and as needed for therapeutic purposes. She was also to continue following the Nitro Protocol.  In regards to her lower back pain she was advised to continue core strengthening exercises.  Pt got the Body Helix sleeve but says that she couldn't find it in a size that was small enough for her so she has been wearing Spanx and tight yoga pants. She reports that she is having some relief with the compression she receives by doing this. She is still following Nitro Protocol. She reports that her hip pain is "much better" overall since her last visit. She still has a little bit of pain but its not keeping her up at night anymore. Some nights she can even sleep on the right side which she hasn't been able to do for years. Most of the time she only has pain when she over does it.  Pt reports that lower back pain has improved. She has been seeing a Physiological scientist a couple of times a week and doing core strengthening exercises.  Pt denies fever, chills, night sweats.   06/30/17: Megan Taylor is an established patient presenting today in follow-up of tendinopathy of the RT gluteus medius and lumbar spondylosis. She was last seen 04/21/2017 and advise to continue use of Body Helix hip spica and Nitroglycerin patches.  Pt reports  that she tried the Body Helix but was unable to find it in her size she she has been wearing Spanx and reports improvement with pain while wearing this. She has been using Nitroglycerin patches with no side effects. She feels that she was doing better a couple of months ago than she is doing now. She is starting with wake from sleep again d/t the pain. Pain is still in the RT gluteal muscle and radiates into the RT leg. Pain is worse when she sleeps on her LT side but she cannot sleep on her back d/t back pain. She has tried taking Advil for the pain but NSAIDs cause GI upset so she doesn't take them often. She says that back in June she was able to sleep on the RT side for a little while but now she cannot ly on her RT side for 5 minutes before the pain starts. She has been working with a Physiological scientist who has been helping her with stretching and upper body strength. She has noticed that when she does a lot of upper body workouts she develops neck pain and HA.    10/29/17: Compared to the last office visit, her previously described symptoms are worsening, sx began to flare-up again in early 09/2017.  Current symptoms are moderate and described as stinging, grabbing, stabbing, aching & are radiating to top of RT thigh. She denies groin pain. Pain is present now when she sleeps on RT or  LT side and when sleeping on her back. At times she has pain with every step. At times the hip is tender to palpation.  She has been using Nitroglycerin patches.  She had hip surgery 2 years ago in May and the soreness seems to be right along the line where the incision was.  11/02/17: Compared to the last office visit, her previously described symptoms of RT hip/gluteal pain are: show no change  Current symptoms are moderate & are radiating to top of RT thigh She has been using Nitro patches and was prescribed Tramadol prn.  11/16/17: Compared to the last office visit, her previously described symptoms of RT hip pain:    are improving, significant improvement over the past 2-3 days. She has noticed sciatic nerve pain since inj, she has hx of sciatic nerve pain.  Current symptoms are mild & are radiating to RT leg.  She had PRP injection 11/02/2017 and tolerated well. She had previously tried taking Tramadol prn for the pain. She was prescribed Nitroglycerin patches and advised to continue following Nitro Protocol.   11/30/17: Compared to the last office visit, her previously described symptoms are improving, night time sciatica is improving. She has been able to sleep without waking up d/t pain for the past 4 nights.  Current symptoms are mild & are radiating to R leg.  She had PRP injection 11/02/17 and responded well. She continues to ice the hip. She is following Nitro Protocol and reports not side effects.   12/14/17: Compared to the last office visit on 11/30/17, her previously described R hip pain symptoms are improving w/ little to no pain during the day and she is now able to sleep at night.  She states that she did 30 min worth of yard work and that made her glutes sore but not her R hip.  Pt states that she has resumed walking on a TM and that she has gotten up to 20 min. Current symptoms are mild & are nonradiating She has been using her nitro patches, doing HEP consisting of sidelying hip aBd and had a PRP injection on 11/02/17.  01/11/2018: Compared to the last office visit, her previously described symptoms are improving. She is able to sleep at night without disturbance.  Current symptoms are, for the most part, resolved. She did have pain when sitting in a plastic chair at bible study. She had pain for a couple of days afterward but that has not resolved.  She has been walking 30 min daily and working on gluteal strengthening.  She has been following Nitro Protocol with no side effects. She has been wearing spanx when she goes on a walk but is no longer wearing daily.   ROS Denies night time  disturbances. Denies fevers, chills, or night sweats. Denies unexplained weight loss. Denies personal history of cancer. Denies changes in bowel or bladder habits. Denies recent unreported falls. Denies new or worsening dyspnea or wheezing. Denies headaches or dizziness.  Denies numbness, tingling or weakness  In the extremities.  Denies dizziness or presyncopal episodes Denies lower extremity edema    HISTORY & PERTINENT PRIOR DATA:  Prior History reviewed and updated per electronic medical record.  Significant/pertinent history, findings, studies include:  reports that she has never smoked. She has never used smokeless tobacco. No results for input(s): HGBA1C, LABURIC, CREATINE in the last 8760 hours. No specialty comments available. No problems updated.  OBJECTIVE:  VS:  HT:5' 3.5" (161.3 cm)   WT:108 lb 12.8 oz (  49.4 kg)  BMI:18.97    BP:102/70  HR:66bpm  TEMP: ( )  RESP:96 %   PHYSICAL EXAM: Constitutional: WDWN, Non-toxic appearing. Psychiatric: Alert & appropriately interactive.  Not depressed or anxious appearing. Respiratory: No increased work of breathing.  Trachea Midline Eyes: Pupils are equal.  EOM intact without nystagmus.  No scleral icterus  Vascular Exam: warm to touch no edema  lower extremity neuro exam: unremarkable normal strength normal sensation normal reflexes  MSK Exam: Marked improvements in her right hip pain with palpation of the greater trochanter and glute medius musculature.   ASSESSMENT & PLAN:   1. Greater trochanteric bursitis of right hip     PLAN: She is doing remarkably well.  MSK ultrasound appears to be phenomenally improved although she does still have a small seroma that is persistent.  Continue with compression and avoidance of exacerbating activities but this time resume activities as tolerated with slow increases over the next 6 to 12 weeks.  If any persistent symptoms she will follow back up but overall I think she is  doing quite well and I do think that the persistent seroma that she has will remain in spite of any further intervention.  Follow-up: Return if symptoms worsen or fail to improve.      Please see additional documentation for Objective, Assessment and Plan sections. Pertinent additional documentation may be included in corresponding procedure notes, imaging studies, problem based documentation and patient instructions. Please see these sections of the encounter for additional information regarding this visit.  CMA/ATC served as Education administrator during this visit. History, Physical, and Plan performed by medical provider. Documentation and orders reviewed and attested to.      Gerda Diss, [place] [facility] Physician

## 2018-02-01 ENCOUNTER — Ambulatory Visit (INDEPENDENT_AMBULATORY_CARE_PROVIDER_SITE_OTHER): Payer: Medicare Other | Admitting: Psychology

## 2018-02-01 DIAGNOSIS — F320 Major depressive disorder, single episode, mild: Secondary | ICD-10-CM | POA: Diagnosis not present

## 2018-02-08 ENCOUNTER — Ambulatory Visit (INDEPENDENT_AMBULATORY_CARE_PROVIDER_SITE_OTHER): Payer: Medicare Other | Admitting: Family Medicine

## 2018-02-08 ENCOUNTER — Ambulatory Visit (INDEPENDENT_AMBULATORY_CARE_PROVIDER_SITE_OTHER): Payer: Medicare Other

## 2018-02-08 ENCOUNTER — Encounter: Payer: Self-pay | Admitting: Family Medicine

## 2018-02-08 VITALS — BP 110/72 | HR 62 | Temp 98.2°F | Ht 63.5 in | Wt 104.6 lb

## 2018-02-08 DIAGNOSIS — K529 Noninfective gastroenteritis and colitis, unspecified: Secondary | ICD-10-CM

## 2018-02-08 DIAGNOSIS — R197 Diarrhea, unspecified: Secondary | ICD-10-CM | POA: Diagnosis not present

## 2018-02-08 LAB — COMPREHENSIVE METABOLIC PANEL
ALT: 17 U/L (ref 0–35)
AST: 18 U/L (ref 0–37)
Albumin: 4.1 g/dL (ref 3.5–5.2)
Alkaline Phosphatase: 51 U/L (ref 39–117)
BUN: 23 mg/dL (ref 6–23)
CO2: 31 mEq/L (ref 19–32)
Calcium: 9.4 mg/dL (ref 8.4–10.5)
Chloride: 102 mEq/L (ref 96–112)
Creatinine, Ser: 0.81 mg/dL (ref 0.40–1.20)
GFR: 74.38 mL/min (ref >60.00)
Glucose, Bld: 85 mg/dL (ref 70–99)
Potassium: 4.2 mEq/L (ref 3.5–5.1)
Sodium: 139 mEq/L (ref 135–145)
Total Bilirubin: 0.7 mg/dL (ref 0.2–1.2)
Total Protein: 6.4 g/dL (ref 6.0–8.3)

## 2018-02-08 LAB — CBC WITH DIFFERENTIAL/PLATELET
Basophils Absolute: 0 10*3/uL (ref 0.0–0.1)
Basophils Relative: 0.9 % (ref 0.0–3.0)
Eosinophils Absolute: 0 10*3/uL (ref 0.0–0.7)
Eosinophils Relative: 0.9 % (ref 0.0–5.0)
HCT: 42.5 % (ref 36.0–46.0)
Hemoglobin: 14.6 g/dL (ref 12.0–15.0)
Lymphocytes Relative: 36.8 % (ref 12.0–46.0)
Lymphs Abs: 1.5 10*3/uL (ref 0.7–4.0)
MCHC: 34.3 g/dL (ref 30.0–36.0)
MCV: 93.4 fl (ref 78.0–100.0)
Monocytes Absolute: 0.4 10*3/uL (ref 0.1–1.0)
Monocytes Relative: 9.2 % (ref 3.0–12.0)
Neutro Abs: 2.1 10*3/uL (ref 1.4–7.7)
Neutrophils Relative %: 52.2 % (ref 43.0–77.0)
Platelets: 191 10*3/uL (ref 150.0–400.0)
RBC: 4.54 Mil/uL (ref 3.87–5.11)
RDW: 12.9 % (ref 11.5–15.5)
WBC: 4.1 10*3/uL (ref 4.0–10.5)

## 2018-02-08 NOTE — Progress Notes (Signed)
Megan Taylor is a 72 y.o. female is here for follow up.  History of Present Illness:   Megan Taylor CMA acting as scribe for Dr. Juleen Taylor.  HPI: Patient comes in today for watery stool. She has been having symptoms for several weeks. Patient denies any abdominal cramping or pain. She states that she has not started any new supplements or medications. No travel. No hospitalizations or exposures. No history of the same. No treatment. Weight loss of 4 pounds. No dizziness. No melena or BRBPR.   There are no preventive care reminders to display for this patient.   Depression screen [facility] 2/9 09/09/2017 07/13/2017  Decreased Interest 2 2  Down, Depressed, Hopeless 1 2  PHQ - 2 Score 3 4  Altered sleeping 0 2  Tired, decreased energy 0 1  Change in appetite 0 0  Feeling bad or failure about yourself  1 1  Trouble concentrating 0 0  Moving slowly or fidgety/restless 0 0  Suicidal thoughts 2 1  PHQ-9 Score 6 9  Difficult doing work/chores Very difficult Very difficult   PMHx, SurgHx, SocialHx, FamHx, Medications, and Allergies were reviewed in the Visit Navigator and updated as appropriate.   Patient Active Problem List   Diagnosis Date Noted  . Adjustment reaction 07/13/2017  . Vitamin D deficiency 07/13/2017  . Routine health maintenance 07/13/2017  . B12 deficiency 07/13/2017  . Fatigue 07/13/2017  . Seroma of musculoskeletal structure after musculoskeletal system procedure 04/21/2017  . Greater trochanteric bursitis of right hip 01/27/2017  . Lumbar spondylosis 01/26/2017  . Syncope and collapse 10/01/2016  . Tendinopathy of right gluteus medius 02/17/2016  . METATARSALGIA 02/05/2009  . BUNION, LEFT FOOT 02/05/2009  . FOOT PAIN, BILATERAL 02/05/2009  . TALIPES CAVUS 02/05/2009   Social History   Tobacco Use  . Smoking status: Never Smoker  . Smokeless tobacco: Never Used  Substance Use Topics  . Alcohol use: Yes    Alcohol/week: 3.0 - 3.6 oz    Types: 5 - 6 Standard  drinks or equivalent per week    Comment: wine  . Drug use: No   Review of Systems  Constitutional: Negative for chills and fever.  HENT: Negative for ear pain and sinus pain.   Eyes: Negative for blurred vision and double vision.  Respiratory: Negative for cough and shortness of breath.   Cardiovascular: Negative for chest pain and palpitations.  Gastrointestinal: Positive for diarrhea. Negative for abdominal pain and vomiting.  Genitourinary: Negative for dysuria and hematuria.  Musculoskeletal: Negative for back pain and neck pain.  Neurological: Negative for dizziness and headaches.  Psychiatric/Behavioral: Negative for depression and hallucinations.   Current Medications and Allergies:   Current Outpatient Medications:  .  Ascorbic Acid Buffered (BUFFERED VITAMIN C) 1000 MG CAPS, Take 1,000 mg by mouth daily., Disp: , Rfl:  .  aspirin 81 MG tablet, Take 81 mg by mouth daily., Disp: , Rfl:  .  B Complex Vitamins (B COMPLEX PO), Take 1 tablet by mouth daily., Disp: , Rfl:  .  calcium carbonate (OS-CAL) 600 MG tablet, Take 600 mg by mouth daily., Disp: , Rfl:  .  Cholecalciferol (VITAMIN D) 2000 UNITS CAPS, Take 2,000 Units by mouth daily. , Disp: , Rfl:  .  Coenzyme Q10 (COQ10) 100 MG CAPS, , Disp: , Rfl:  .  cycloSPORINE (RESTASIS) 0.05 % ophthalmic emulsion, Place 1 drop into both eyes 2 (two) times daily., Disp: , Rfl:  .  estradiol (ESTRACE) 0.1 MG/GM vaginal cream,  0.5 g intravaginally 1-3 times per week., Disp: 42.5 g, Rfl: 0 .  fish oil-omega-3 fatty acids 1000 MG capsule, Take 1 g by mouth 2 (two) times daily. , Disp: , Rfl:  .  ibuprofen (ADVIL,MOTRIN) 200 MG tablet, Take 200 mg by mouth as needed., Disp: , Rfl:  .  nitroGLYCERIN (NITRODUR - DOSED IN MG/24 HR) 0.2 mg/hr patch, PLACE 1/4 PATCH OVER AFFECTED REGION REMOVE AND REPLACE ONCE DAILY SLIGHTLY ALTER SKIN PLACEMENT, Disp: 30 patch, Rfl: 1 .  Probiotic Product (PROBIOTIC DAILY PO), Take 1 capsule by mouth daily. ,  Disp: , Rfl:  .  traMADol (ULTRAM) 50 MG tablet, Take 1 tablet (50 mg total) by mouth every 6 (six) hours as needed for moderate pain. (Patient taking differently: Take 50 mg by mouth as needed for moderate pain. ), Disp: 30 tablet, Rfl: 0   Allergies  Allergen Reactions  . St. John's Wort [Hypericum Perforatum]   . Aleve [Naproxen Sodium] Other (See Comments)    Hot flashes, fever  . Augmentin [Amoxicillin-Pot Clavulanate] Other (See Comments)    Severe diarrhea after one dose   Review of Systems   Pertinent items are noted in the HPI. Otherwise, ROS is negative.  Vitals:   Vitals:   02/08/18 1043  BP: 110/72  Pulse: 62  Temp: 98.2 F (36.8 C)  TempSrc: Oral  SpO2: 95%  Weight: 104 lb 9.6 oz (47.4 kg)  Height: 5' 3.5" (1.613 m)     Body mass index is 18.24 kg/m. Physical Exam:   Physical Exam  Constitutional: She is oriented to person, place, and time. She appears well-developed and well-nourished. No distress.  HENT:  Head: Normocephalic and atraumatic.  Right Ear: External ear normal.  Left Ear: External ear normal.  Nose: Nose normal.  Mouth/Throat: Oropharynx is clear and moist.  Eyes: Pupils are equal, round, and reactive to light. Conjunctivae and EOM are normal.  Neck: Normal range of motion. Neck supple. No thyromegaly present.  Cardiovascular: Normal rate, regular rhythm, normal heart sounds and intact distal pulses.  Pulmonary/Chest: Effort normal and breath sounds normal.  Abdominal: Soft. Bowel sounds are normal.  Musculoskeletal: Normal range of motion.  Lymphadenopathy:    She has no cervical adenopathy.  Neurological: She is alert and oriented to person, place, and time.  Skin: Skin is warm and dry. Capillary refill takes less than 2 seconds.  Psychiatric: She has a normal mood and affect. Her behavior is normal.  Nursing note and vitals reviewed.   Assessment and Plan:   Megan Taylor was seen today for diarrhea.  Diagnoses and all orders for this  visit:  Chronic diarrhea -     DG Abd 1 View; Future -     CBC with Differential/Platelet -     Comprehensive metabolic panel -     Clostridium Difficile by PCR; Future -     Ova and parasite examination; Future -     Stool culture; Future   . Reviewed expectations re: course of current medical issues. . Discussed self-management of symptoms. . Outlined signs and symptoms indicating need for more acute intervention. . Patient verbalized understanding and all questions were answered. Marland Kitchen Health Maintenance issues including appropriate healthy diet, exercise, and smoking avoidance were discussed with patient. . See orders for this visit as documented in the electronic medical record. . Patient received an After Visit Summary.  Briscoe Deutscher, DO [place] 02/08/2018  Future Appointments  Date Time Provider [place]  03/01/2018 11:00  AM [place] Sherrilyn Rist, LCSW [facility] None

## 2018-02-09 ENCOUNTER — Other Ambulatory Visit: Payer: Medicare Other

## 2018-02-09 DIAGNOSIS — K529 Noninfective gastroenteritis and colitis, unspecified: Secondary | ICD-10-CM | POA: Diagnosis not present

## 2018-02-10 LAB — OVA AND PARASITE EXAMINATION
CONCENTRATE RESULT:: NONE SEEN
MICRO NUMBER:: 90531595
SPECIMEN QUALITY:: ADEQUATE
TRICHROME RESULT:: NONE SEEN

## 2018-02-10 LAB — C. DIFFICILE GDH AND TOXIN A/B
GDH ANTIGEN: NOT DETECTED
MICRO NUMBER:: 90531593
SPECIMEN QUALITY:: ADEQUATE
TOXIN A AND B: NOT DETECTED

## 2018-02-13 LAB — STOOL CULTURE
MICRO NUMBER:: 90531592
MICRO NUMBER:: 90531594
MICRO NUMBER:: 90531596
SHIGA RESULT:: NOT DETECTED
SPECIMEN QUALITY:: ADEQUATE
SPECIMEN QUALITY:: ADEQUATE
SPECIMEN QUALITY:: ADEQUATE

## 2018-02-15 ENCOUNTER — Encounter: Payer: Self-pay | Admitting: Family Medicine

## 2018-02-15 ENCOUNTER — Encounter: Payer: Self-pay | Admitting: Gastroenterology

## 2018-02-15 DIAGNOSIS — K529 Noninfective gastroenteritis and colitis, unspecified: Secondary | ICD-10-CM

## 2018-02-17 ENCOUNTER — Encounter: Payer: Self-pay | Admitting: Sports Medicine

## 2018-02-17 NOTE — Telephone Encounter (Signed)
We can try sending to [facility], but I cannot guarantee that the appointment would be any sooner with them.

## 2018-02-17 NOTE — Procedures (Signed)
LIMITED MSK ULTRASOUND OF Right hip Images were obtained and interpreted by myself, Teresa Coombs, DO  Images have been saved and stored to PACS system. Images obtained on: GE S7 Ultrasound machine  FINDINGS:   Greater trochanteric changes are significant improved with a small amount of hypoechoic change persistently within the glute medius tendon insertion but this is mild.  There is a small persistent seroma and small bursal layer but this is significantly improved compared to the past.  Overall markedly improved tissue texture appreciated today.  IMPRESSION:  1. Status post PRP to the greater trochanteric tendons with marked improvements with persistent seroma.

## 2018-03-01 ENCOUNTER — Ambulatory Visit: Payer: Medicare Other | Admitting: Psychology

## 2018-03-23 ENCOUNTER — Ambulatory Visit (INDEPENDENT_AMBULATORY_CARE_PROVIDER_SITE_OTHER): Payer: Medicare Other | Admitting: Psychology

## 2018-03-23 DIAGNOSIS — F320 Major depressive disorder, single episode, mild: Secondary | ICD-10-CM

## 2018-04-11 ENCOUNTER — Other Ambulatory Visit (INDEPENDENT_AMBULATORY_CARE_PROVIDER_SITE_OTHER): Payer: Medicare Other

## 2018-04-11 ENCOUNTER — Encounter

## 2018-04-11 ENCOUNTER — Ambulatory Visit (INDEPENDENT_AMBULATORY_CARE_PROVIDER_SITE_OTHER): Payer: Medicare Other | Admitting: Gastroenterology

## 2018-04-11 ENCOUNTER — Encounter: Payer: Self-pay | Admitting: Gastroenterology

## 2018-04-11 VITALS — BP 122/82 | HR 76 | Ht 63.25 in | Wt 104.2 lb

## 2018-04-11 DIAGNOSIS — K529 Noninfective gastroenteritis and colitis, unspecified: Secondary | ICD-10-CM | POA: Diagnosis not present

## 2018-04-11 DIAGNOSIS — R634 Abnormal weight loss: Secondary | ICD-10-CM

## 2018-04-11 LAB — TSH: TSH: 2.03 u[IU]/mL (ref 0.35–4.50)

## 2018-04-11 LAB — IGA: IgA: 179 mg/dL (ref 68–378)

## 2018-04-11 MED ORDER — PEG-KCL-NACL-NASULF-NA ASC-C 140 G PO SOLR: 140.0000 g | ORAL | 0 refills | Status: DC

## 2018-04-11 NOTE — Patient Instructions (Signed)
If you are age 70 or older, your body mass index should be between 23-30. Your Body mass index is 18.32 kg/m. If this is out of the aforementioned range listed, please consider follow up with your Primary Care Provider.  If you are age 63 or younger, your body mass index should be between 19-25. Your Body mass index is 18.32 kg/m. If this is out of the aformentioned range listed, please consider follow up with your Primary Care Provider.   You have been scheduled for a colonoscopy. Please follow written instructions given to you at your visit today.  Please pick up your prep supplies at the pharmacy within the next 1-3 days. If you use inhalers (even only as needed), please bring them with you on the day of your procedure. Your physician has requested that you go to www.startemmi.com and enter the access code given to you at your visit today. This web site gives a general overview about your procedure. However, you should still follow specific instructions given to you by our office regarding your preparation for the procedure.  It was a pleasure to meet you today!  Dr. Loletha Carrow

## 2018-04-11 NOTE — Progress Notes (Signed)
[facility] Consult Note:  History: Megan Taylor 04/11/2018  Referring physician: Briscoe Deutscher, DO  Reason for consult/chief complaint: Diarrhea (watery diarrhea x 6-7 months, frequency in the mornings, )   Subjective  HPI:  This is a very pleasant 72 year old woman referred by primary care for over 6 months of diarrhea.  This is a definite change for her, since she previously had constipation type IBS.  She had no foreign travel or antibiotic use prior to the onset of diarrhea.  He will most often occur early in the morning, will she will have several loose nonbloody BMs in the first hour or 2 after awakening.  Sometimes if she wakes overnight she will also have to go emergently to the bathroom and then is afraid to go back to sleep because she might have an accident.  She denies abdominal pain, nausea, vomiting.  Her husband feels she may have restricted her food intake, and she is lost perhaps 10 pounds since it started, but she believes that is stabilized for at least the last month.  Her stomach makes a lot of "noise" and when there is diarrhea, there is a lot of urgency.  She rarely if ever takes NSAIDs and takes no antacid medicines.  She generally does not like to take medicines if she can avoid it. She reports that her sister had what seems like a similar problem and was told she had "excess bacteria".  She received a course of antibiotics with only temporary improvement, then went on a "natural therapy" to resolve the problem.  ROS:  Review of Systems  Constitutional: Negative for appetite change and unexpected weight change.  HENT: Negative for mouth sores and voice change.   Eyes: Negative for pain and redness.  Respiratory: Negative for cough and shortness of breath.   Cardiovascular: Negative for chest pain and palpitations.  Genitourinary: Negative for dysuria and hematuria.  Musculoskeletal: Negative for arthralgias and myalgias.  Skin: Negative for  pallor and rash.  Neurological: Negative for weakness and headaches.  Hematological: Negative for adenopathy.     Past Medical History: Past Medical History:  Diagnosis Date  . Dry eyes    uses Restasis eye drops.  . Hip pain    "bursitis right hip"  . Melanoma ([place])   . Osteopenia    was on fosamax for awhile  . Syncope and collapse 10/01/2016  . Tremor   . Wolff-Parkinson-White (WPW) syndrome 1992   heart ablation     Past Surgical History: Past Surgical History:  Procedure Laterality Date  . BREAST BIOPSY  1975   benign lump  . BREAST CYST ASPIRATION     benign  . BUNIONECTOMY Left   . DILATION AND CURETTAGE OF UTERUS  1977   with BTL  . EXCISION/RELEASE BURSA HIP Right 02/17/2016   Procedure: RIGHT HIP BURSECTOMY GLUTEAL TENDON REPAIR;  Surgeon: Gaynelle Arabian, MD;  Location: [facility];  Service: Orthopedics;  Laterality: Right;  . heart ablation  1992   [place]"Wolfe-Parkinson white syndrome"  . LUMBAR DISC SURGERY  2018   [place] - decompression, foraminectomy.   Marland Kitchen MELANOMA EXCISION Right 06/2009   right ankle  . TONSILLECTOMY       Family History: Family History  Problem Relation Age of Onset  . Lung cancer Father 5  . Hypertension Mother   . Hyperlipidemia Mother   . Congestive Heart Failure Mother   . Osteoporosis Sister 84  . Hodgkin's lymphoma Paternal Grandfather   . Hypertension Maternal  Grandmother   . Hyperlipidemia Maternal Grandmother   . Congestive Heart Failure Paternal Grandmother   . Tremor Daughter     Social History: Social History   Socioeconomic History  . Marital status: Married    Spouse name: Not on file  . Number of children: 2  . Years of education: college  . Highest education level: Not on file  Occupational History  . Occupation: Retired  Scientific laboratory technician  . Financial resource strain: Not on file  . Food insecurity:    Worry: Not on file    Inability: Not on file  . Transportation needs:    Medical: Not on file     Non-medical: Not on file  Tobacco Use  . Smoking status: Never Smoker  . Smokeless tobacco: Never Used  Substance and Sexual Activity  . Alcohol use: Yes    Alcohol/week: 3.0 - 3.6 oz    Types: 5 - 6 Standard drinks or equivalent per week    Comment: wine  . Drug use: No  . Sexual activity: Yes    Partners: Male    Birth control/protection: Post-menopausal  Lifestyle  . Physical activity:    Days per week: Not on file    Minutes per session: Not on file  . Stress: Not on file  Relationships  . Social connections:    Talks on phone: Not on file    Gets together: Not on file    Attends religious service: Not on file    Active member of club or organization: Not on file    Attends meetings of clubs or organizations: Not on file    Relationship status: Not on file  Other Topics Concern  . Not on file  Social History Narrative   Drinks about 1/2 cup of coffee a day     Allergies: Allergies  Allergen Reactions  . St. John's Wort [Hypericum Perforatum]   . Aleve [Naproxen Sodium] Other (See Comments)    Hot flashes, fever  . Augmentin [Amoxicillin-Pot Clavulanate] Other (See Comments)    Severe diarrhea after one dose    Outpatient Meds: Current Outpatient Medications  Medication Sig Dispense Refill  . Ascorbic Acid Buffered (BUFFERED VITAMIN C) 1000 MG CAPS Take 1,000 mg by mouth daily.    . B Complex Vitamins (B COMPLEX PO) Take 1 tablet by mouth daily.    . calcium carbonate (OS-CAL) 600 MG tablet Take 600 mg by mouth daily.    . Cholecalciferol (VITAMIN D) 2000 UNITS CAPS Take 2,000 Units by mouth daily.     . Coenzyme Q10 (COQ10) 100 MG CAPS     . cycloSPORINE (RESTASIS) 0.05 % ophthalmic emulsion Place 1 drop into both eyes 2 (two) times daily.    Marland Kitchen estradiol (ESTRACE) 0.1 MG/GM vaginal cream 0.5 g intravaginally 1-3 times per week. 42.5 g 0  . fish oil-omega-3 fatty acids 1000 MG capsule Take 1 g by mouth 2 (two) times daily.     Marland Kitchen ibuprofen (ADVIL,MOTRIN) 200 MG  tablet Take 200 mg by mouth as needed.    . Probiotic Product (PROBIOTIC DAILY PO) Take 1 capsule by mouth daily.     Marland Kitchen PEG-KCl-NaCl-NaSulf-Na Asc-C (PLENVU) 140 g SOLR Take 140 g by mouth as directed. 1 each 0   No current facility-administered medications for this visit.       ___________________________________________________________________ Objective   Exam:  BP 122/82 (BP Location: Left Arm, Patient Position: Sitting, Cuff Size: Normal)   Pulse 76   Ht 5' 3.25" (  1.607 m) Comment: height measured without shoes  Wt 104 lb 4 oz (47.3 kg)   LMP 10/12/1997   BMI 18.32 kg/m    General: this is a(n) thin and well-appearing woman accompanied by her husband  Eyes: sclera anicteric, no redness  ENT: oral mucosa moist without lesions, no cervical or supraclavicular lymphadenopathy, good dentition.  Tongue normal  CV: RRR without murmur, S1/S2, no JVD, no peripheral edema  Resp: clear to auscultation bilaterally, normal RR and effort noted  GI: soft, no tenderness, with active bowel sounds. No guarding or palpable organomegaly noted.  Skin; warm and dry, no rash or jaundice noted  Neuro: awake, alert and oriented x 3. Normal gross motor function and fluent speech  Labs:  CBC Latest Ref Rng & Units 02/08/2018 07/13/2017 02/17/2016  WBC 4.0 - 10.5 K/uL 4.1 4.4 7.9  Hemoglobin 12.0 - 15.0 g/dL 14.6 15.4(H) 14.2  Hematocrit 36.0 - 46.0 % 42.5 45.8 41.5  Platelets 150.0 - 400.0 K/uL 191.0 208.0 194   CMP Latest Ref Rng & Units 02/08/2018 07/22/2017 07/13/2017  Glucose 70 - 99 mg/dL 85 95 89  BUN 6 - 23 mg/dL 23 20 23   Creatinine 0.40 - 1.20 mg/dL 0.81 0.81 0.78  Sodium 135 - 145 mEq/L 139 139 140  Potassium 3.5 - 5.1 mEq/L 4.2 3.5 5.5(H)  Chloride 96 - 112 mEq/L 102 101 103  CO2 19 - 32 mEq/L 31 31 32  Calcium 8.4 - 10.5 mg/dL 9.4 9.5 10.1  Total Protein 6.0 - 8.3 g/dL 6.4 - 7.0  Total Bilirubin 0.2 - 1.2 mg/dL 0.7 - 0.6  Alkaline Phos 39 - 117 U/L 51 - 59  AST 0 - 37 U/L 18  - 17  ALT 0 - 35 U/L 17 - 16   Negative ova/parasite 02/09/18  GDH and toxin C diff neg 02/09/18  Last TSH normal 07/2017   Assessment: Encounter Diagnoses  Name Primary?  . Chronic diarrhea Yes  . Abnormal loss of weight     It sounds most like microscopic colitis.  Weight loss may be from food avoidance because eating causes the diarrhea.  However, must also consider small bowel sources with malabsorption.  No apparent risk factors for pancreatic exocrine insufficiency or small bowel bacterial overgrowth.  Seems greater than one would expect from IBS.  Plan:  TSH, TTG antibody and total IgA level Colonoscopy with biopsies.  She is agreeable after discussion of procedure and risks.  The benefits and risks of the planned procedure were described in detail with the patient or (when appropriate) their health care proxy.  Risks were outlined as including, but not limited to, bleeding, infection, perforation, adverse medication reaction leading to cardiac or pulmonary decompensation, or pancreatitis (if ERCP).  The limitation of incomplete mucosal visualization was also discussed.  No guarantees or warranties were given.  Continue use of as needed Imodium, especially if she is traveling.  She reports that a couple of doses will stop the diarrhea for about a day and a half.  Thank you for the courtesy of this consult.  Please call me with any questions or concerns.  Nelida Meuse III  CC: Briscoe Deutscher, DO

## 2018-04-12 LAB — TISSUE TRANSGLUTAMINASE, IGA: (TTG) AB, IGA: 1 U/mL

## 2018-04-21 ENCOUNTER — Ambulatory Visit (AMBULATORY_SURGERY_CENTER): Payer: Medicare Other | Admitting: Gastroenterology

## 2018-04-21 ENCOUNTER — Other Ambulatory Visit: Payer: Self-pay

## 2018-04-21 ENCOUNTER — Encounter: Payer: Self-pay | Admitting: Gastroenterology

## 2018-04-21 VITALS — BP 105/54 | HR 66 | Temp 98.9°F | Resp 13 | Ht 60.0 in | Wt 104.0 lb

## 2018-04-21 DIAGNOSIS — K529 Noninfective gastroenteritis and colitis, unspecified: Secondary | ICD-10-CM

## 2018-04-21 DIAGNOSIS — R197 Diarrhea, unspecified: Secondary | ICD-10-CM | POA: Diagnosis not present

## 2018-04-21 MED ORDER — SODIUM CHLORIDE 0.9 % IV SOLN: 500.0000 mL | Freq: Once | INTRAVENOUS | Status: DC

## 2018-04-21 NOTE — Progress Notes (Signed)
Report given to PACU, vss 

## 2018-04-21 NOTE — Patient Instructions (Signed)
YOU HAD AN ENDOSCOPIC PROCEDURE TODAY AT [facility]:   Refer to the procedure report that was given to you for any specific questions about what was found during the examination.  If the procedure report does not answer your questions, please call your gastroenterologist to clarify.  If you requested that your care partner not be given the details of your procedure findings, then the procedure report has been included in a sealed envelope for you to review at your convenience later.  YOU SHOULD EXPECT: Some feelings of bloating in the abdomen. Passage of more gas than usual.  Walking can help get rid of the air that was put into your GI tract during the procedure and reduce the bloating. If you had a lower endoscopy (such as a colonoscopy or flexible sigmoidoscopy) you may notice spotting of blood in your stool or on the toilet paper. If you underwent a bowel prep for your procedure, you may not have a normal bowel movement for a few days.  Please Note:  You might notice some irritation and congestion in your nose or some drainage.  This is from the oxygen used during your procedure.  There is no need for concern and it should clear up in a day or so.  SYMPTOMS TO REPORT IMMEDIATELY:   Following lower endoscopy (colonoscopy or flexible sigmoidoscopy):  Excessive amounts of blood in the stool  Significant tenderness or worsening of abdominal pains  Swelling of the abdomen that is new, acute  Fever of 100F or higher   For urgent or emergent issues, a gastroenterologist can be reached at any hour by calling [phone].   DIET:  We do recommend a small meal at first, but then you may proceed to your regular diet.  Drink plenty of fluids but you should avoid alcoholic beverages for 24 hours.  ACTIVITY:  You should plan to take it easy for the rest of today and you should NOT DRIVE or use heavy machinery until tomorrow (because of the sedation medicines used during the test).     FOLLOW UP: Our staff will call the number listed on your records the next business day following your procedure to check on you and address any questions or concerns that you may have regarding the information given to you following your procedure. If we do not reach you, we will leave a message.  However, if you are feeling well and you are not experiencing any problems, there is no need to return our call.  We will assume that you have returned to your regular daily activities without incident.  If any biopsies were taken you will be contacted by phone or by letter within the next 1-3 weeks.  Please call us at [phone] if you have not heard about the biopsies in 3 weeks.    SIGNATURES/CONFIDENTIALITY: You and/or your care partner have signed paperwork which will be entered into your electronic medical record.  These signatures attest to the fact that that the information above on your After Visit Summary has been reviewed and is understood.  Full responsibility of the confidentiality of this discharge information lies with you and/or your care-partner.    Handout was given to your care partner on diverticulosis. You may resume your current medications today. Await biopsy results. Repeat colonoscopy for screening purposes in 10 years. Please call if any questions or concerns.

## 2018-04-21 NOTE — Progress Notes (Signed)
No problems noted in the recovery room. maw 

## 2018-04-21 NOTE — Op Note (Signed)
[facility] Patient Name: Megan Taylor Procedure Date: 04/21/2018 11:57 AM MRN: [id number] Endoscopist: [place]. Loletha Carrow , MD Age: 72 Referring MD:  Date of Birth: 12-Jul-1946 Gender: Female Account #: [id number] Procedure:                Colonoscopy Indications:              Chronic diarrhea Medicines:                Monitored Anesthesia Care Procedure:                Pre-Anesthesia Assessment:                           - Prior to the procedure, a History and Physical                            was performed, and patient medications and                            allergies were reviewed. The patient's tolerance of                            previous anesthesia was also reviewed. The risks                            and benefits of the procedure and the sedation                            options and risks were discussed with the patient.                            All questions were answered, and informed consent                            was obtained. Prior Anticoagulants: The patient has                            taken no previous anticoagulant or antiplatelet                            agents. ASA Grade Assessment: II - A patient with                            mild systemic disease. After reviewing the risks                            and benefits, the patient was deemed in                            satisfactory condition to undergo the procedure.                           After obtaining informed consent, the colonoscope  was passed under direct vision. Throughout the                            procedure, the patient's blood pressure, pulse, and                            oxygen saturations were monitored continuously. The                            Colonoscope was introduced through the anus and                            advanced to the the terminal ileum, with                            identification of the appendiceal orifice and IC                        valve. The colonoscopy was performed with                            difficulty due to multiple diverticula in the colon                            and a tortuous colon. Successful completion of the                            procedure was aided by changing the patient to a                            supine position and using manual pressure. The                            patient tolerated the procedure well. The quality                            of the bowel preparation was excellent. The                            terminal ileum, ileocecal valve, appendiceal                            orifice, and rectum were photographed. The quality                            of the bowel preparation was evaluated using the                            BBPS [facility] Bowel Preparation Scale) with scores                            of: Right Colon = 3, Transverse Colon = 3 and Left  Colon = 3 (entire mucosa seen well with no residual                            staining, small fragments of stool or opaque                            liquid). The total BBPS score equals 9. Scope In: 11:59:54 AM Scope Out: 12:14:41 PM Scope Withdrawal Time: 0 hours 9 minutes 33 seconds  Total Procedure Duration: 0 hours 14 minutes 47 seconds  Findings:                 The perianal and digital rectal examinations were                            normal.                           The terminal ileum appeared normal.                           Diverticula were found in the left colon with                            marked tortuosity.                           Normal mucosa was found in the entire colon.                            Biopsies for histology were taken with a cold                            forceps from the right colon and left colon for                            evaluation of microscopic colitis.                           The exam was otherwise without abnormality on                             direct and retroflexion views. Complications:            No immediate complications. Estimated Blood Loss:     Estimated blood loss was minimal. Impression:               - The examined portion of the ileum was normal.                           - Diverticulosis in the left colon.                           - Normal mucosa in the entire examined colon.                            Biopsied.                           -  The examination was otherwise normal on direct                            and retroflexion views. Recommendation:           - Patient has a contact number available for                            emergencies. The signs and symptoms of potential                            delayed complications were discussed with the                            patient. Return to normal activities tomorrow.                            Written discharge instructions were provided to the                            patient.                           - Resume previous diet.                           - Continue present medications.                           - Await pathology results.                           - Repeat colonoscopy in 10 years for screening                            purposes. Henry L. Loletha Carrow, MD 04/21/2018 12:18:28 PM This report has been signed electronically.

## 2018-04-21 NOTE — Progress Notes (Signed)
Called to room to assist during endoscopic procedure.  Patient ID and intended procedure confirmed with present staff. Received instructions for my participation in the procedure from the performing physician.  

## 2018-04-22 ENCOUNTER — Telehealth: Payer: Self-pay | Admitting: *Deleted

## 2018-04-22 NOTE — Telephone Encounter (Signed)
  Follow up Call-  Call back number 04/21/2018  Post procedure Call Back phone  # #[phone] cell  Permission to leave phone message Yes  Some recent data might be hidden     Patient questions:  Do you have a fever, pain , or abdominal swelling? No. Pain Score  0 *  Have you tolerated food without any problems? Yes.    Have you been able to return to your normal activities? Yes.    Do you have any questions about your discharge instructions: Diet   Yes.   Medications  No. Follow up visit  No.  Do you have questions or concerns about your Care? No.  Actions: * If pain score is 4 or above: No action needed, pain <4.

## 2018-05-02 ENCOUNTER — Other Ambulatory Visit: Payer: Self-pay

## 2018-05-02 MED ORDER — HYOSCYAMINE SULFATE 0.125 MG SL SUBL: SUBLINGUAL_TABLET | SUBLINGUAL | 1 refills | Status: DC

## 2018-05-19 ENCOUNTER — Ambulatory Visit: Payer: Medicare Other | Admitting: Psychology

## 2018-05-24 ENCOUNTER — Other Ambulatory Visit: Payer: Self-pay | Admitting: Gastroenterology

## 2018-06-02 ENCOUNTER — Other Ambulatory Visit: Payer: Self-pay | Admitting: Gastroenterology

## 2018-06-27 ENCOUNTER — Ambulatory Visit: Payer: Self-pay | Admitting: Gastroenterology

## 2018-06-28 ENCOUNTER — Ambulatory Visit: Payer: Self-pay | Admitting: Gastroenterology

## 2018-08-08 ENCOUNTER — Ambulatory Visit: Payer: Self-pay | Admitting: Gastroenterology

## 2018-08-31 ENCOUNTER — Other Ambulatory Visit: Payer: Self-pay

## 2018-09-02 ENCOUNTER — Encounter: Payer: Self-pay | Admitting: Family Medicine

## 2018-09-02 ENCOUNTER — Ambulatory Visit (INDEPENDENT_AMBULATORY_CARE_PROVIDER_SITE_OTHER): Payer: Medicare Other | Admitting: Family Medicine

## 2018-09-02 VITALS — BP 110/70 | HR 66 | Temp 98.0°F | Ht 60.0 in | Wt 107.0 lb

## 2018-09-02 DIAGNOSIS — R0982 Postnasal drip: Secondary | ICD-10-CM | POA: Diagnosis not present

## 2018-09-02 DIAGNOSIS — H6122 Impacted cerumen, left ear: Secondary | ICD-10-CM | POA: Diagnosis not present

## 2018-09-02 MED ORDER — FLUTICASONE PROPIONATE 50 MCG/ACT NA SUSP: 2.0000 | Freq: Every day | NASAL | 6 refills | Status: DC

## 2018-09-02 NOTE — Progress Notes (Signed)
Patient: Megan Taylor MRN: [id number] DOB: 29-Dec-1945 PCP: Briscoe Deutscher, DO     Subjective:  Chief Complaint  Patient presents with  . Sore Throat    HPI: The patient is a 72 y.o. female who presents today for sore throat and other URI symptoms. Symptoms started Monday night with a scratchy throat. She gargled with warm salt water, drank liquids but states it's getting worse. On Tuesday she had possible fever with chills and went away. The scratchy throat is making her cough. Her voice is getting deeper and deeper. No pain with swallowing liquids or solids. She is going to [place] next week and wanted to make sure she is okay. She has some congestion and some sinus stuff on left side. Left ear is a little stopped [place] as well. She has no shortness of breath or wheezing. Not a smoker, not around smokers. She has no asthma/copd. She has taken advil in the afternoon only. She was with a ton of people last weekend and unsure of sick contacts.   Review of Systems  Constitutional: Positive for fatigue. Negative for chills and fever.  HENT: Positive for congestion, ear pain, postnasal drip, rhinorrhea, sinus pain and sore throat.        Left sided sinus and ear pain  Respiratory: Positive for cough. Negative for chest tightness, shortness of breath and wheezing.   Cardiovascular: Negative for chest pain.  Gastrointestinal: Negative for abdominal pain, nausea and vomiting.  Musculoskeletal: Negative for back pain, myalgias and neck pain.  Skin: Negative.   Neurological: Negative for dizziness and headaches.  Psychiatric/Behavioral: Positive for sleep disturbance.    Allergies Patient is allergic to st. john's wort [st john's wort]; aleve [naproxen sodium]; and augmentin [amoxicillin-pot clavulanate].  Past Medical History Patient  has a past medical history of Dry eyes, Hip pain, Melanoma ([place]), Osteopenia, Syncope and collapse (10/01/2016), Tremor, and Wolff-Parkinson-White (WPW)  syndrome (1992).  Surgical History Patient  has a past surgical history that includes heart ablation (1992); Tonsillectomy; Breast cyst aspiration; Melanoma excision (Right, 06/2009); Dilation and curettage of uterus (1977); Excision/release bursa hip (Right, 02/17/2016); Breast biopsy (1975); Lumbar disc surgery (2018); Bunionectomy (Left); Colonoscopy; and Wisdom tooth extraction.  Family History Pateint's family history includes Congestive Heart Failure in her mother and paternal grandmother; Hodgkin's lymphoma in her paternal grandfather; Hyperlipidemia in her maternal grandmother and mother; Hypertension in her maternal grandmother and mother; Lung cancer (age of onset: 57) in her father; Osteoporosis (age of onset: 63) in her sister; Tremor in her daughter.  Social History Patient  reports that she has never smoked. She has never used smokeless tobacco. She reports that she drinks about 5.0 - 6.0 standard drinks of alcohol per week. She reports that she does not use drugs.    Objective: Vitals:   09/02/18 1103  BP: 110/70  Pulse: 66  Temp: 98 F (36.7 C)  TempSrc: Oral  SpO2: 98%  Weight: 107 lb (48.5 kg)  Height: 5' (1.524 m)    Body mass index is 20.9 kg/m.  Physical Exam  Constitutional: She appears well-developed and well-nourished.  HENT:  Right Ear: Tympanic membrane normal.  Mouth/Throat: Mucous membranes are normal. Tonsils are 0 on the right. Tonsils are 0 on the left. No tonsillar exudate.  Left TM impacted by cerumen  Cobblestoning on posterior pharynx Left maxillary sinus ttp Deviated septum left Cyst like mass on right faucial pillar (was followed by ENT for this)   Neck: Normal range of motion. Neck supple. No  thyromegaly present.  Cardiovascular: Normal rate, regular rhythm and normal heart sounds.  Pulmonary/Chest: Effort normal and breath sounds normal.  Abdominal: Soft.  Vitals reviewed.  Ceruminosis is noted.  Wax is removed by syringing and manual  debridement. Instructions for home care to prevent wax buildup are given. Wax removed and TM visualized and wnl.     Assessment/plan: 1. Post-nasal drip Trial of flonase qhs. Also recommended cool mist humidifier, honey and robitussin Dm prn for cough. Any fever/worsneing sinus pain she is to let us know, but disused no signs of bacterial infection or indication for antibiotic.   2. Cerumen impaction -lavaged and cleaned. TM normal and hearing restored.     Return if symptoms worsen or fail to improve.   Orma Flaming, MD [place]   09/02/2018

## 2018-09-02 NOTE — Patient Instructions (Addendum)
flonase at night to help the post nasal drip.   Postnasal Drip Postnasal drip is the feeling of mucus going down the back of your throat. Mucus is a slimy substance that moistens and cleans your nose and throat, as well as the air pockets in face bones near your forehead and cheeks (sinuses). Small amounts of mucus pass from your nose and sinuses down the back of your throat all the time. This is normal. When you produce too much mucus or the mucus gets too thick, you can feel it. Some common causes of postnasal drip include:  Having more mucus because of: ? A cold or the flu. ? Allergies. ? Cold air. ? Certain medicines.  Having more mucus that is thicker because of: ? A sinus or nasal infection. ? Dry air. ? A food allergy.  Follow these instructions at home: Relieving discomfort  Gargle with a salt-water mixture 3-4 times a day or as needed. To make a salt-water mixture, completely dissolve -1 tsp of salt in 1 cup of warm water.  If the air in your home is dry, use a humidifier to add moisture to the air.  Use a saline spray or container (neti pot) to flush out the nose (nasal irrigation). These methods can help clear away mucus and keep the nasal passages moist. General instructions  Take over-the-counter and prescription medicines only as told by your health care provider.  Follow instructions from your health care provider about eating or drinking restrictions. You may need to avoid caffeine.  Avoid things that you know you are allergic to (allergens), like dust, mold, pollen, pets, or certain foods.  Drink enough fluid to keep your urine pale yellow.  Keep all follow-up visits as told by your health care provider. This is important. Contact a health care provider if:  You have a fever.  You have a sore throat.  You have difficulty swallowing.  You have headache.  You have sinus pain.  You have a cough that does not go away.  The mucus from your nose becomes  thick and is green or yellow in color.  You have cold or flu symptoms that last more than 10 days. Summary  Postnasal drip is the feeling of mucus going down the back of your throat.  If your health care provider approves, use nasal irrigation or a nasal spray 2?4 times a day.  Avoid things that you know you are allergic to (allergens), like dust, mold, pollen, pets, or certain foods. This information is not intended to replace advice given to you by your health care provider. Make sure you discuss any questions you have with your health care provider. Document Released: 01/11/2017 Document Revised: 01/11/2017 Document Reviewed: 01/11/2017 Elsevier Interactive Patient Education  [employer].

## 2018-09-20 DIAGNOSIS — H16223 Keratoconjunctivitis sicca, not specified as Sjogren's, bilateral: Secondary | ICD-10-CM | POA: Diagnosis not present

## 2018-09-20 DIAGNOSIS — H2513 Age-related nuclear cataract, bilateral: Secondary | ICD-10-CM | POA: Diagnosis not present

## 2018-09-20 DIAGNOSIS — H43392 Other vitreous opacities, left eye: Secondary | ICD-10-CM | POA: Diagnosis not present

## 2018-11-07 ENCOUNTER — Ambulatory Visit (INDEPENDENT_AMBULATORY_CARE_PROVIDER_SITE_OTHER): Payer: Medicare Other | Admitting: Family Medicine

## 2018-11-07 VITALS — BP 108/64 | HR 70 | Temp 98.6°F | Ht 60.0 in | Wt 109.6 lb

## 2018-11-07 DIAGNOSIS — J300 Vasomotor rhinitis: Secondary | ICD-10-CM

## 2018-11-07 DIAGNOSIS — J029 Acute pharyngitis, unspecified: Secondary | ICD-10-CM

## 2018-11-07 MED ORDER — AZELASTINE HCL 0.1 % NA SOLN: 2.0000 | Freq: Two times a day (BID) | NASAL | 12 refills | Status: DC

## 2018-11-07 MED ORDER — FIRST-DUKES MOUTHWASH MT SUSP: 5.0000 mL | Freq: Three times a day (TID) | OROMUCOSAL | 0 refills | Status: DC | PRN

## 2018-11-07 NOTE — Progress Notes (Signed)
ALEIAH MOHAMMED is a 73 y.o. female here for an acute visit.  History of Present Illness:   Sore Throat   This is a recurrent problem. The current episode started 1 to 4 weeks ago. The problem has been waxing and waning. There has been no fever. The pain is mild. Associated symptoms include a hoarse voice. Pertinent negatives include no abdominal pain, congestion, coughing, diarrhea, drooling, ear discharge, ear pain, headaches, plugged ear sensation, neck pain, shortness of breath, stridor, swollen glands, trouble swallowing or vomiting. She has had no exposure to strep or mono. She has tried gargles for the symptoms. The treatment provided mild relief.   PMHx, SurgHx, SocialHx, Medications, and Allergies were reviewed in the Visit Navigator and updated as appropriate.  Current Medications   .  Ascorbic Acid Buffered (BUFFERED VITAMIN C) 1000 MG CAPS, Take 1,000 mg by mouth daily., Disp: , Rfl:  .  B Complex Vitamins (B COMPLEX PO), Take 1 tablet by mouth daily., Disp: , Rfl:  .  calcium carbonate (OS-CAL) 600 MG tablet, Take 600 mg by mouth daily., Disp: , Rfl:  .  Cholecalciferol (VITAMIN D) 2000 UNITS CAPS, Take 2,000 Units by mouth daily. , Disp: , Rfl:  .  Coenzyme Q10 (COQ10) 100 MG CAPS, , Disp: , Rfl:  .  cycloSPORINE (RESTASIS) 0.05 % ophthalmic emulsion, Place 1 drop into both eyes 2 (two) times daily., Disp: , Rfl:  .  estradiol (ESTRACE) 0.1 MG/GM vaginal cream, 0.5 g intravaginally 1-3 times per week., Disp: 42.5 g, Rfl: 0 .  fish oil-omega-3 fatty acids 1000 MG capsule, Take 1 g by mouth 2 (two) times daily. , Disp: , Rfl:  .  fluticasone (FLONASE) 50 MCG/ACT nasal spray, Place 2 sprays into both nostrils daily., Disp: 16 g, Rfl: 6 .  ibuprofen (ADVIL,MOTRIN) 200 MG tablet, Take 200 mg by mouth as needed., Disp: , Rfl:  .  Probiotic Product (PROBIOTIC DAILY PO), Take 1 capsule by mouth daily. , Disp: , Rfl:   Allergies  Allergen Reactions  . St. John's Wort [St John's Wort]      Pt does not remember reaction  . Aleve [Naproxen Sodium] Other (See Comments)    Hot flashes, fever  . Augmentin [Amoxicillin-Pot Clavulanate] Other (See Comments)    Severe diarrhea after one dose   Review of Systems   Pertinent items are noted in the HPI. Otherwise, ROS is negative.  Vitals   Vitals:   11/07/18 1358  BP: 108/64  Pulse: 70  Temp: 98.6 F (37 C)  TempSrc: Oral  SpO2: 98%  Weight: 109 lb 9.6 oz (49.7 kg)  Height: 5' (1.524 m)     Body mass index is 21.4 kg/m.  Physical Exam   Physical Exam Vitals signs and nursing note reviewed.  Constitutional:      General: She is not in acute distress.    Appearance: She is well-developed.  HENT:     Head: Normocephalic and atraumatic.     Right Ear: Tympanic membrane normal.     Left Ear: Tympanic membrane normal.     Nose: Rhinorrhea present.     Mouth/Throat:     Mouth: Mucous membranes are pale.     Pharynx: Uvula midline. No oropharyngeal exudate or posterior oropharyngeal erythema.     Tonsils: No tonsillar exudate or tonsillar abscesses.  Eyes:     Conjunctiva/sclera: Conjunctivae normal.     Pupils: Pupils are equal, round, and reactive to light.  Neck:  Musculoskeletal: Normal range of motion and neck supple.     Thyroid: No thyromegaly.  Cardiovascular:     Rate and Rhythm: Normal rate and regular rhythm.     Heart sounds: Normal heart sounds.  Pulmonary:     Effort: Pulmonary effort is normal.  Abdominal:     Palpations: Abdomen is soft.  Lymphadenopathy:     Cervical: No cervical adenopathy.  Skin:    General: Skin is warm.  Neurological:     General: No focal deficit present.     Mental Status: She is alert.  Psychiatric:        Behavior: Behavior normal.    Assessment and Plan   Jaspreet was seen today for sore throat.  Diagnoses and all orders for this visit:  Sore throat Comments: Not c/w infectious cause. Likely PND v silent reflux.  Orders: -      Diphenhyd-Hydrocort-Nystatin (FIRST-DUKES MOUTHWASH) SUSP; Use as directed 5 mLs in the mouth or throat 3 (three) times daily as needed.  Vasomotor rhinitis -     azelastine (ASTELIN) 0.1 % nasal spray; Place 2 sprays into both nostrils 2 (two) times daily. Use in each nostril as directed   . Reviewed expectations re: course of current medical issues. . Discussed self-management of symptoms. . Outlined signs and symptoms indicating need for more acute intervention. . Patient verbalized understanding and all questions were answered. Marland Kitchen Health Maintenance issues including appropriate healthy diet, exercise, and smoking avoidance were discussed with patient. . See orders for this visit as documented in the electronic medical record. . Patient received an After Visit Summary.  Briscoe Deutscher, DO [place], [facility] 11/09/2018

## 2018-11-07 NOTE — Progress Notes (Deleted)
Megan Taylor is a 73 y.o. female here for an acute visit.  History of Present Illness:   Lonell Grandchild, CMA acting as scribe for Dr. Briscoe Deutscher.     Sore Throat   This is a new problem. The current episode started in the past 7 days. The problem has been gradually worsening. Neither side of throat is experiencing more pain than the other. There has been no fever. The pain is at a severity of 8/10. The pain is severe. Associated symptoms include congestion, coughing and trouble swallowing. Pertinent negatives include no ear discharge, ear pain, headaches, shortness of breath, swollen glands or vomiting. Associated symptoms comments: Cough not productive . She has had exposure to strep. She has had no exposure to mono. Exposure to: as child no strep after . She has tried gargles (hot liquids, over the counter drops ) for the symptoms. The treatment provided no relief.  :   PMHx, SurgHx, SocialHx, Medications, and Allergies were reviewed in the Visit Navigator and updated as appropriate.  Current Medications   Current Outpatient Medications:  .  Ascorbic Acid Buffered (BUFFERED VITAMIN C) 1000 MG CAPS, Take 1,000 mg by mouth daily., Disp: , Rfl:  .  B Complex Vitamins (B COMPLEX PO), Take 1 tablet by mouth daily., Disp: , Rfl:  .  calcium carbonate (OS-CAL) 600 MG tablet, Take 600 mg by mouth daily., Disp: , Rfl:  .  Cholecalciferol (VITAMIN D) 2000 UNITS CAPS, Take 2,000 Units by mouth daily. , Disp: , Rfl:  .  Coenzyme Q10 (COQ10) 100 MG CAPS, , Disp: , Rfl:  .  cycloSPORINE (RESTASIS) 0.05 % ophthalmic emulsion, Place 1 drop into both eyes 2 (two) times daily., Disp: , Rfl:  .  estradiol (ESTRACE) 0.1 MG/GM vaginal cream, 0.5 g intravaginally 1-3 times per week., Disp: 42.5 g, Rfl: 0 .  fish oil-omega-3 fatty acids 1000 MG capsule, Take 1 g by mouth 2 (two) times daily. , Disp: , Rfl:  .  fluticasone (FLONASE) 50 MCG/ACT nasal spray, Place 2 sprays into both nostrils daily., Disp:  16 g, Rfl: 6 .  ibuprofen (ADVIL,MOTRIN) 200 MG tablet, Take 200 mg by mouth as needed., Disp: , Rfl:  .  Probiotic Product (PROBIOTIC DAILY PO), Take 1 capsule by mouth daily. , Disp: , Rfl:   Current Facility-Administered Medications:  .  0.9 %  sodium chloride infusion, 500 mL, Intravenous, Once, Danis, Kirke Corin, MD   Allergies  Allergen Reactions  . St. John's Wort [St John's Wort]     Pt does not remember reaction  . Aleve [Naproxen Sodium] Other (See Comments)    Hot flashes, fever  . Augmentin [Amoxicillin-Pot Clavulanate] Other (See Comments)    Severe diarrhea after one dose   Review of Systems   Pertinent items are noted in the HPI. Otherwise, ROS is negative.  Vitals   Vitals:   11/07/18 1358  BP: 108/64  Pulse: 70  Temp: 98.6 F (37 C)  TempSrc: Oral  SpO2: 98%  Weight: 109 lb 9.6 oz (49.7 kg)  Height: 5' (1.524 m)     Body mass index is 21.4 kg/m.  Physical Exam   Physical Exam  Results for orders placed or performed in visit on 04/11/18  Tissue transglutaminase, IgA  Result Value Ref Range   (tTG) Ab, IgA 1 U/mL  IgA  Result Value Ref Range   IgA 179 68 - 378 mg/dL  TSH  Result Value Ref Range   TSH  2.03 0.35 - 4.50 uIU/mL    Assessment and Plan   There are no diagnoses linked to this encounter.  . Reviewed expectations re: course of current medical issues. . Discussed self-management of symptoms. . Outlined signs and symptoms indicating need for more acute intervention. . Patient verbalized understanding and all questions were answered. Marland Kitchen Health Maintenance issues including appropriate healthy diet, exercise, and smoking avoidance were discussed with patient. . See orders for this visit as documented in the electronic medical record. . Patient received an After Visit Summary.  *** CMA served as Education administrator during this visit. History, Physical, and Plan performed by medical provider. The above documentation has been reviewed and is accurate  and complete. Briscoe Deutscher, [place], [place], [facility] 11/07/2018

## 2018-11-09 ENCOUNTER — Encounter: Payer: Self-pay | Admitting: Family Medicine

## 2018-11-10 ENCOUNTER — Encounter: Payer: Self-pay | Admitting: Family Medicine

## 2018-11-11 ENCOUNTER — Other Ambulatory Visit: Payer: Self-pay | Admitting: *Deleted

## 2018-11-11 DIAGNOSIS — Z780 Asymptomatic menopausal state: Secondary | ICD-10-CM

## 2018-11-11 DIAGNOSIS — E559 Vitamin D deficiency, unspecified: Secondary | ICD-10-CM

## 2018-11-11 NOTE — Telephone Encounter (Signed)
Okay for bone density order?

## 2018-11-15 ENCOUNTER — Other Ambulatory Visit: Payer: Self-pay | Admitting: Sports Medicine

## 2018-11-15 ENCOUNTER — Other Ambulatory Visit: Payer: Self-pay | Admitting: Respiratory Therapy

## 2018-11-15 DIAGNOSIS — Z1231 Encounter for screening mammogram for malignant neoplasm of breast: Secondary | ICD-10-CM

## 2018-11-16 ENCOUNTER — Ambulatory Visit (INDEPENDENT_AMBULATORY_CARE_PROVIDER_SITE_OTHER)
Admission: RE | Admit: 2018-11-16 | Discharge: 2018-11-16 | Disposition: A | Discharge status: Home / Self Care | Payer: Medicare Other | Source: Ambulatory Visit | Attending: Family Medicine | Admitting: Family Medicine

## 2018-11-16 DIAGNOSIS — Z780 Asymptomatic menopausal state: Secondary | ICD-10-CM | POA: Diagnosis not present

## 2018-11-16 DIAGNOSIS — E559 Vitamin D deficiency, unspecified: Secondary | ICD-10-CM

## 2018-12-14 ENCOUNTER — Ambulatory Visit
Admission: RE | Admit: 2018-12-14 | Discharge: 2018-12-14 | Disposition: A | Discharge status: Home / Self Care | Payer: Medicare Other | Source: Ambulatory Visit | Attending: Sports Medicine | Admitting: Sports Medicine

## 2018-12-14 DIAGNOSIS — Z1231 Encounter for screening mammogram for malignant neoplasm of breast: Secondary | ICD-10-CM | POA: Diagnosis not present

## 2019-01-24 DIAGNOSIS — H16223 Keratoconjunctivitis sicca, not specified as Sjogren's, bilateral: Secondary | ICD-10-CM | POA: Diagnosis not present

## 2019-01-24 DIAGNOSIS — H2513 Age-related nuclear cataract, bilateral: Secondary | ICD-10-CM | POA: Diagnosis not present

## 2019-01-24 DIAGNOSIS — H00024 Hordeolum internum left upper eyelid: Secondary | ICD-10-CM | POA: Diagnosis not present

## 2019-01-26 ENCOUNTER — Ambulatory Visit (INDEPENDENT_AMBULATORY_CARE_PROVIDER_SITE_OTHER): Payer: Medicare Other | Admitting: Physician Assistant

## 2019-01-26 ENCOUNTER — Encounter: Payer: Self-pay | Admitting: Physician Assistant

## 2019-01-26 DIAGNOSIS — Z0000 Encounter for general adult medical examination without abnormal findings: Secondary | ICD-10-CM

## 2019-01-26 NOTE — Progress Notes (Signed)
Patient Care Team: Briscoe Deutscher, DO as PCP - General (Family Medicine) Shelor Melburn Hake, Rex Kras, LCSW as Social Worker (Licensed Clinical Social Worker) Danis, Kirke Corin, MD as Consulting Physician (Gastroenterology) Harriett Sine, MD as Consulting Physician (Dermatology) Katy Fitch, Darlina Guys, MD as Consulting Physician (Ophthalmology) Yisroel Ramming, Everardo All, MD as Consulting Physician (Obstetrics and Gynecology) Gaynelle Arabian, MD as Consulting Physician (Orthopedic Surgery) Gerda Diss, DO as Consulting Physician (Sports Medicine)  Virtual Visit via Video   I connected with Megan Taylor on 01/26/19 at  1:20 PM EDT by a video enabled telemedicine application and verified that I am speaking with the correct person using two identifiers. Location patient: Home. Location provider: [employer], Office. Persons participating in the virtual visit: Sharla Kidney, [place]   I discussed the limitations of evaluation and management by telemedicine and the availability of in person appointments. The patient expressed understanding and agreed to proceed.  Subjective:   Patient presents for Medicare Wellness Exam.  She has no further complaints today.  Past medical, surgical, social and family history reviewed and updated:  Patient Active Problem List   Diagnosis Date Noted  . Adjustment reaction 07/13/2017  . Vitamin D deficiency 07/13/2017  . Routine health maintenance 07/13/2017  . B12 deficiency 07/13/2017  . Fatigue 07/13/2017  . Seroma of musculoskeletal structure after musculoskeletal system procedure 04/21/2017  . Greater trochanteric bursitis of right hip 01/27/2017  . Lumbar spondylosis 01/26/2017  . Syncope and collapse 10/01/2016  . Tendinopathy of right gluteus medius 02/17/2016  . METATARSALGIA 02/05/2009  . BUNION, LEFT FOOT 02/05/2009  . FOOT PAIN, BILATERAL 02/05/2009  . TALIPES CAVUS 02/05/2009   Past Surgical History:  Procedure  Laterality Date  . BREAST BIOPSY  1975   benign lump  . BREAST CYST ASPIRATION     benign  . BUNIONECTOMY Left   . COLONOSCOPY    . DILATION AND CURETTAGE OF UTERUS  1977   with BTL  . EXCISION/RELEASE BURSA HIP Right 02/17/2016   Procedure: RIGHT HIP BURSECTOMY GLUTEAL TENDON REPAIR;  Surgeon: Gaynelle Arabian, MD;  Location: [facility];  Service: Orthopedics;  Laterality: Right;  . heart ablation  1992   [place]"Wolfe-Parkinson white syndrome"  . LUMBAR DISC SURGERY  2018   [place] - decompression, foraminectomy.   Marland Kitchen MELANOMA EXCISION Right 06/2009   right ankle  . TONSILLECTOMY    . WISDOM TOOTH EXTRACTION     Social History   Tobacco Use  . Smoking status: Never Smoker  . Smokeless tobacco: Never Used  Substance Use Topics  . Alcohol use: Yes    Alcohol/week: 5.0 - 6.0 standard drinks    Types: 5 - 6 Standard drinks or equivalent per week    Comment: wine   Are there smokers in your home (other than you)? no family history includes Congestive Heart Failure in her mother and paternal grandmother; Hodgkin's lymphoma in her paternal grandfather; Hyperlipidemia in her maternal grandmother and mother; Hypertension in her maternal grandmother and mother; Lung cancer (age of onset: 106) in her father; Osteoporosis (age of onset: 70) in her sister; Tremor in her daughter.  reports being sexually active and has had partner(s) who are Female. She reports using the following method of birth control/protection: Post-menopausal.  Current medication list and allergy/intolerance information reviewed and updated:     Current Outpatient Medications:  .  Ascorbic Acid Buffered (BUFFERED VITAMIN C) 1000 MG CAPS, Take 1,000 mg by mouth daily., Disp: , Rfl:  .  azelastine (ASTELIN) 0.1 % nasal spray, Place 2 sprays into both nostrils 2 (two) times daily. Use in each nostril as directed, Disp: 30 mL, Rfl: 12 .  B Complex Vitamins (B COMPLEX PO), Take 1 tablet by mouth daily., Disp: , Rfl:  .  calcium  carbonate (OS-CAL) 600 MG tablet, Take 600 mg by mouth daily., Disp: , Rfl:  .  Cholecalciferol (VITAMIN D) 2000 UNITS CAPS, Take 2,000 Units by mouth daily. , Disp: , Rfl:  .  Coenzyme Q10 (COQ10) 100 MG CAPS, Take 1 capsule by mouth daily. , Disp: , Rfl:  .  cycloSPORINE (RESTASIS) 0.05 % ophthalmic emulsion, Place 1 drop into both eyes 2 (two) times daily., Disp: , Rfl:  .  doxycycline (VIBRA-TABS) 100 MG tablet, , Disp: , Rfl:  .  fish oil-omega-3 fatty acids 1000 MG capsule, Take 1 g by mouth 2 (two) times daily. , Disp: , Rfl:  .  fluticasone (FLONASE) 50 MCG/ACT nasal spray, Place 2 sprays into both nostrils daily., Disp: 16 g, Rfl: 6 .  ibuprofen (ADVIL,MOTRIN) 200 MG tablet, Take 200 mg by mouth as needed., Disp: , Rfl:  .  Probiotic Product (PROBIOTIC DAILY PO), Take 1 capsule by mouth daily. , Disp: , Rfl:   Current Facility-Administered Medications:  .  0.9 %  sodium chloride infusion, 500 mL, Intravenous, Once, Danis, Kirke Corin, MD  Allergies  Allergen Reactions  . St. John's Wort [St John's Wort]     Pt does not remember reaction  . Aleve [Naproxen Sodium] Other (See Comments)    Hot flashes, fever  . Augmentin [Amoxicillin-Pot Clavulanate] Other (See Comments)    Severe diarrhea after one dose    Review of Systems: No headache, visual changes, nausea, vomiting, diarrhea, constipation, dizziness, abdominal pain, skin rash, fevers, chills, night sweats, weight loss, swollen lymph nodes, body aches, joint swelling, muscle aches, chest pain, shortness of breath, mood changes, visual or auditory hallucinations.   Health Risk Assessment:   CLINICAL INTAKE, INCLUDING ADLS, SENSORY DIFFICULTY   GOALS Goals   None     FUNCTIONAL STATUS SURVEY, EXERCISE, CARDIAC RISK FACTORS Is the patient deaf or have difficulty hearing?: No Does the patient have difficulty seeing, even when wearing glasses/contacts?: No Does the patient have difficulty concentrating, remembering, or  making decisions?: No Does the patient have difficulty walking or climbing stairs?: No Does the patient have difficulty dressing or bathing?: No Does the patient have difficulty doing errands alone such as visiting a doctor's office or shopping?: No  DEPRESSION QUESTIONNAIRE Depression screen [facility] 2/9 01/26/2019 09/09/2017 07/13/2017  Decreased Interest 1 2 2   Down, Depressed, Hopeless 0 1 2  PHQ - 2 Score 1 3 4   Altered sleeping - 0 2  Tired, decreased energy - 0 1  Change in appetite - 0 0  Feeling bad or failure about yourself  - 1 1  Trouble concentrating - 0 0  Moving slowly or fidgety/restless - 0 0  Suicidal thoughts - 2 1  PHQ-9 Score - 6 9  Difficult doing work/chores - Very difficult Very difficult    FALL RISK Fall Risk  01/26/2019 11/07/2018 08/31/2018 09/09/2017 07/13/2017  Falls in the past year? 0 0 0 No No  Comment - - Emmi Telephone Survey: data to providers prior to load - -  Injury with Fall? - 0 - - -    COGNITIVE FUNCTION    Mini-Cog - 01/26/19 1353    Normal clock drawing test?  yes  How many words correct?  2            Objective:   VITALS: Per patient if applicable, see vitals. NOTE: Hearing and vision unable to be completed as this is a virtual visit in order to keep the patient safely at home during COVID-19 outbreak. GENERAL: Alert, appears well and in no acute distress. HEENT: Atraumatic, conjunctiva clear, no obvious abnormalities on inspection of external nose and ears. NECK: Normal movements of the head and neck. CARDIOPULMONARY: No increased WOB. Speaking in clear sentences. I:E ratio WNL.  MS: Moves all visible extremities without noticeable abnormality. PSYCH: Pleasant and cooperative, well-groomed. Speech normal rate and rhythm. Affect is appropriate. Insight and judgement are appropriate. Attention is focused, linear, and appropriate.  NEURO: CN grossly intact. Oriented as arrived to appointment on time with no prompting. Moves both UE  equally.  SKIN: No obvious lesions, wounds, erythema, or cyanosis noted on face or hands.  Timed Get Up and Go performed: yes, taking < 12 seconds  Assessment and Plan:   This is a routine wellness examination for Megan Taylor.  Immunization History  Administered Date(s) Administered  . Tdap 06/08/2017  . Zoster 08/09/2014    Qualifies for Shingles Vaccine? Yes Note: Contraindications include severe allergic reaction (e.g., anaphylaxis) after a previous dose or to a vaccine component, known severe immunodeficiency (e.g., from hematologic and solid tumors, receipt of chemotherapy, congenital immunodeficiency,  long-term immunosuppressive therapy(g) or patients with HIV infection who are severely immunocompromised), and pregnancy.  Screening Tests Health Maintenance  Topic Date Due  . PNA vac Low Risk Adult (1 of 2 - PCV13) 04/27/2019 (Originally 06/23/2013)  . MAMMOGRAM  12/14/2019  . TETANUS/TDAP  06/09/2027  . COLONOSCOPY  04/21/2028  . DEXA SCAN  Completed  . Hepatitis C Screening  Completed  . INFLUENZA VACCINE  Discontinued    Cancer Screenings: Lung: Low Dose CT Chest recommended if Age 30-80 years, 30 pack-year currently smoking OR have quit w/in 15years. Patient does not qualify. Breast:  Up to date on Mammogram? Yes   Up to date of Bone Density/Dexa? Yes Colorectal: UTD  Advanced Directives has an advanced directive - a copy HAS NOT been provided.  I have personally reviewed and noted the following in the patient's chart:   . Medical and social history . Use of alcohol, tobacco or illicit drugs  . Current medications and supplements . Functional ability and status . Nutritional status . Physical activity . Advanced directives . List of other physicians . Hospitalizations, surgeries, and ER visits in previous 12 months . Vitals . Screenings to include cognitive, depression, and falls . Referrals and appointments  In addition, I have reviewed and discussed with  patient certain preventive protocols, quality metrics, and best practice recommendations. A personalized care plan for preventive services as well as general preventive health recommendations were provided to patient.   [place], [place]  01/26/2019

## 2019-02-07 DIAGNOSIS — H00024 Hordeolum internum left upper eyelid: Secondary | ICD-10-CM | POA: Diagnosis not present

## 2019-03-09 DIAGNOSIS — H00024 Hordeolum internum left upper eyelid: Secondary | ICD-10-CM | POA: Diagnosis not present

## 2019-05-15 ENCOUNTER — Encounter: Payer: Self-pay | Admitting: Family Medicine

## 2019-05-16 ENCOUNTER — Encounter: Payer: Self-pay | Admitting: Family Medicine

## 2019-05-16 ENCOUNTER — Ambulatory Visit (INDEPENDENT_AMBULATORY_CARE_PROVIDER_SITE_OTHER): Payer: Medicare Other | Admitting: Family Medicine

## 2019-05-16 ENCOUNTER — Other Ambulatory Visit: Payer: Self-pay

## 2019-05-16 VITALS — BP 118/72 | HR 72 | Temp 98.2°F | Ht 60.0 in | Wt 107.4 lb

## 2019-05-16 DIAGNOSIS — T63301A Toxic effect of unspecified spider venom, accidental (unintentional), initial encounter: Secondary | ICD-10-CM

## 2019-05-16 MED ORDER — MUPIROCIN 2 % EX OINT: 1.0000 "application " | TOPICAL_OINTMENT | Freq: Two times a day (BID) | CUTANEOUS | 0 refills | Status: DC

## 2019-05-16 NOTE — Progress Notes (Signed)
Megan Taylor is a 73 y.o. female here for an acute visit.  History of Present Illness:   HPI: Patient had insect bite of right lower flank last week.  She says that she was outside of her garage talking to a neighbor and felt something on her right neck.  She thought that she swept it away but believes that this was when she was bitten.  She did not see any bug or spider.  She does know that there are spiders in her garage.  The reaction was that it became itchy and uncomfortable.  It developed a blister and ruptured.  She did have some dull ache in her right flank associated with this.  It is now improving.  She has been using topical antibiotic ointment and keeping it covered with a Band-Aid.  No systemic symptoms.  No history of the same.  PMHx, SurgHx, SocialHx, Medications, and Allergies were reviewed in the Visit Navigator and updated as appropriate.  Current Medications   .  Ascorbic Acid Buffered (BUFFERED VITAMIN C) 1000 MG CAPS, Take 1,000 mg by mouth daily., Disp: , Rfl:  .  azelastine (ASTELIN) 0.1 % nasal spray, Place 2 sprays into both nostrils 2 (two) times daily. Use in each nostril as directed, Disp: 30 mL, Rfl: 12 .  B Complex Vitamins (B COMPLEX PO), Take 1 tablet by mouth daily., Disp: , Rfl:  .  calcium carbonate (OS-CAL) 600 MG tablet, Take 600 mg by mouth daily., Disp: , Rfl:  .  Cholecalciferol (VITAMIN D) 2000 UNITS CAPS, Take 2,000 Units by mouth daily. , Disp: , Rfl:  .  Coenzyme Q10 (COQ10) 100 MG CAPS, Take 1 capsule by mouth daily. , Disp: , Rfl:  .  cycloSPORINE (RESTASIS) 0.05 % ophthalmic emulsion, Place 1 drop into both eyes 2 (two) times daily., Disp: , Rfl:  .  fish oil-omega-3 fatty acids 1000 MG capsule, Take 1 g by mouth 2 (two) times daily. , Disp: , Rfl:  .  fluticasone (FLONASE) 50 MCG/ACT nasal spray, Place 2 sprays into both nostrils daily., Disp: 16 g, Rfl: 6 .  ibuprofen (ADVIL,MOTRIN) 200 MG tablet, Take 200 mg by mouth as needed., Disp: , Rfl:    .  Probiotic Product (PROBIOTIC DAILY PO), Take 1 capsule by mouth daily. , Disp: , Rfl:   Allergies  Allergen Reactions  . St. John's Wort [St John's Wort]     Pt does not remember reaction  . Aleve [Naproxen Sodium] Other (See Comments)    Hot flashes, fever  . Augmentin [Amoxicillin-Pot Clavulanate] Other (See Comments)    Severe diarrhea after one dose   Review of Systems   Pertinent items are noted in the HPI. Otherwise, ROS is negative.  Vitals   Vitals:   05/16/19 0924  BP: 118/72  Pulse: 72  Temp: 98.2 F (36.8 C)  TempSrc: Oral  Weight: 107 lb 6.4 oz (48.7 kg)  Height: 5' (1.524 m)     Body mass index is 20.98 kg/m.  Physical Exam   Physical Exam Vitals signs and nursing note reviewed.  HENT:     Head: Normocephalic and atraumatic.  Eyes:     Pupils: Pupils are equal, round, and reactive to light.  Neck:     Musculoskeletal: Normal range of motion and neck supple.  Cardiovascular:     Rate and Rhythm: Normal rate and regular rhythm.     Heart sounds: Normal heart sounds.  Pulmonary:     Effort: Pulmonary  effort is normal.  Abdominal:     Palpations: Abdomen is soft.  Skin:    General: Skin is warm.  Psychiatric:        Behavior: Behavior normal.     Assessment and Plan   Diagnoses and all orders for this visit:  Spider bite wound, accidental or unintentional, initial encounter -     mupirocin ointment (BACTROBAN) 2 %; Place 1 application into the nose 2 (two) times daily.    . Reviewed expectations re: course of current medical issues. . Discussed self-management of symptoms. . Outlined signs and symptoms indicating need for more acute intervention. . Patient verbalized understanding and all questions were answered. Marland Kitchen Health Maintenance issues including appropriate healthy diet, exercise, and smoking avoidance were discussed with patient. . See orders for this visit as documented in the electronic medical record. . Patient received an  After Visit Summary.   Briscoe Deutscher, DO [place], [place] 05/16/2019

## 2019-05-16 NOTE — Patient Instructions (Signed)
Spider Bite Spider bites are not common. When spider bites do happen, most do not cause serious health problems. There are only a few types of spider bites that can cause serious health problems. What are the causes? This condition is caused when a person accidentally makes contact with a spider in a way that traps the spider against the person's skin. What are the signs or symptoms? Symptoms may vary depending on the type of spider. Some spider bites may cause symptoms within 1 hour after the bite. For other spider bites, it may take 1-2 days for symptoms to develop. Common symptoms of this condition include:  A raised area that is red.  Redness and swelling around the area of the bite or bites.  Discomfort or pain in the area of the bite. A few types of spiders, such as the black widow spider or the [place] recluse spider, can inject poison (venom) into a bite wound. This venom causes more serious symptoms. Symptoms of a venomous spider bite vary, and may include:  Muscle cramps.  Nausea, vomiting, or abdominal pain.  A fever.  A skin sore (lesion) that spreads. This can break into an open wound (skin ulcer).  Light-headedness or dizziness. How is this diagnosed? This condition may be diagnosed based on your symptoms and a physical exam. Your health care provider will ask about the history of your injury and any details you may have about the spider. This may help to determine what type of spider bit you. How is this treated? Many spider bites do not require treatment. If needed, this condition may be treated by:  Icing and keeping the bite area raised (elevated).  Taking or applying over-the-counter or prescription medicines to help control symptoms such as pain and itching.  Having a tetanus shot, if needed.  Taking antibiotic medicine. Follow these instructions at home: Medicines  Take or apply over-the-counter and prescription medicines only as told by your health care  provider.  If you were prescribed an antibiotic medicine, take it as told by your health care provider. Do not stop using the antibiotic even if you start to feel better. Managing pain and swelling   If directed, put ice on the bite area. ? Put ice in a plastic bag. ? Place a towel between your skin and the bag. ? Leave the ice on for 20 minutes, 2-3 times a day.  Elevate the affected area above the level of your heart while you are sitting or lying down. General instructions   Do not scratch the bite area.  Keep the bite area clean and dry. Wash the bite area daily with soap and water as told by your health care provider.  Keep all follow-up visits as told by your health care provider. This is important. Contact a health care provider if:  Your bite does not get better after 3 days of treatment.  Your bite turns black or purple.  You have increased redness, swelling, or pain at the site of the bite. Get help right away if:  You develop shortness of breath or chest pain.  You have fluid, blood, or pus coming from the bite area.  You have muscle cramps or painful muscle spasms.  You develop abdominal pain, nausea, or vomiting.  You feel unusually tired (fatigued) or sleepy. Summary  Spider bites are not common. When spider bites do happen, most do not cause serious health problems.  Take or apply over-the-counter and prescription medicines only as told by your health   care provider.  Keep the bite area clean and dry. Wash the bite area daily with soap and water as told by your health care provider.  Contact a health care provider if you have increased redness, swelling, or pain at the site of the bite.  Get help right away if you have new or worsening symptoms or develop shortness of breath or chest pain. This information is not intended to replace advice given to you by your health care provider. Make sure you discuss any questions you have with your health care  provider. Document Released: 11/05/2004 Document Revised: 05/10/2018 Document Reviewed: 05/10/2018 Elsevier Patient Education  2020 [employer].

## 2019-09-20 DIAGNOSIS — H43392 Other vitreous opacities, left eye: Secondary | ICD-10-CM | POA: Diagnosis not present

## 2019-09-20 DIAGNOSIS — H2513 Age-related nuclear cataract, bilateral: Secondary | ICD-10-CM | POA: Diagnosis not present

## 2019-09-20 DIAGNOSIS — H16223 Keratoconjunctivitis sicca, not specified as Sjogren's, bilateral: Secondary | ICD-10-CM | POA: Diagnosis not present

## 2019-09-20 DIAGNOSIS — H5213 Myopia, bilateral: Secondary | ICD-10-CM | POA: Diagnosis not present

## 2019-11-06 ENCOUNTER — Other Ambulatory Visit: Payer: Self-pay

## 2019-11-06 ENCOUNTER — Emergency Department (HOSPITAL_COMMUNITY)
Admission: EM | Admit: 2019-11-06 | Discharge: 2019-11-06 | Disposition: A | Discharge status: Home / Self Care | Payer: Medicare Other | Attending: Emergency Medicine | Admitting: Emergency Medicine

## 2019-11-06 ENCOUNTER — Encounter (HOSPITAL_COMMUNITY): Payer: Self-pay | Admitting: Emergency Medicine

## 2019-11-06 DIAGNOSIS — Z79899 Other long term (current) drug therapy: Secondary | ICD-10-CM | POA: Insufficient documentation

## 2019-11-06 DIAGNOSIS — I959 Hypotension, unspecified: Secondary | ICD-10-CM | POA: Diagnosis not present

## 2019-11-06 DIAGNOSIS — R55 Syncope and collapse: Secondary | ICD-10-CM | POA: Insufficient documentation

## 2019-11-06 DIAGNOSIS — R404 Transient alteration of awareness: Secondary | ICD-10-CM | POA: Diagnosis not present

## 2019-11-06 DIAGNOSIS — R4020 Unspecified coma: Secondary | ICD-10-CM | POA: Diagnosis not present

## 2019-11-06 DIAGNOSIS — R42 Dizziness and giddiness: Secondary | ICD-10-CM | POA: Diagnosis not present

## 2019-11-06 LAB — CBC WITH DIFFERENTIAL/PLATELET
Abs Immature Granulocytes: 0.03 10*3/uL (ref 0.00–0.07)
Basophils Absolute: 0 10*3/uL (ref 0.0–0.1)
Basophils Relative: 1 %
Eosinophils Absolute: 0.1 10*3/uL (ref 0.0–0.5)
Eosinophils Relative: 1 %
HCT: 40.5 % (ref 36.0–46.0)
Hemoglobin: 13.8 g/dL (ref 12.0–15.0)
Immature Granulocytes: 1 %
Lymphocytes Relative: 20 %
Lymphs Abs: 1.3 10*3/uL (ref 0.7–4.0)
MCH: 32.4 pg (ref 26.0–34.0)
MCHC: 34.1 g/dL (ref 30.0–36.0)
MCV: 95.1 fL (ref 80.0–100.0)
Monocytes Absolute: 0.5 10*3/uL (ref 0.1–1.0)
Monocytes Relative: 7 %
Neutro Abs: 4.6 10*3/uL (ref 1.7–7.7)
Neutrophils Relative %: 70 %
Platelets: 168 10*3/uL (ref 150–400)
RBC: 4.26 MIL/uL (ref 3.87–5.11)
RDW: 12.1 % (ref 11.5–15.5)
WBC: 6.5 10*3/uL (ref 4.0–10.5)
nRBC: 0 % (ref 0.0–0.2)

## 2019-11-06 LAB — BASIC METABOLIC PANEL
Anion gap: 12 (ref 5–15)
BUN: 24 mg/dL — ABNORMAL HIGH (ref 8–23)
CO2: 22 mmol/L (ref 22–32)
Calcium: 8.5 mg/dL — ABNORMAL LOW (ref 8.9–10.3)
Chloride: 104 mmol/L (ref 98–111)
Creatinine, Ser: 0.87 mg/dL (ref 0.44–1.00)
GFR calc Af Amer: >60 mL/min (ref >60)
GFR calc non Af Amer: >60 mL/min (ref >60)
Glucose, Bld: 90 mg/dL (ref 70–99)
Potassium: 4 mmol/L (ref 3.5–5.1)
Sodium: 138 mmol/L (ref 135–145)

## 2019-11-06 MED ORDER — SODIUM CHLORIDE 0.9 % IV BOLUS
1000.0000 mL | Freq: Once | INTRAVENOUS | Status: AC
  Administered 2019-11-06: 1000 mL via INTRAVENOUS

## 2019-11-06 NOTE — ED Provider Notes (Signed)
[facility] EMERGENCY DEPARTMENT Provider Note   CSN: [id number] Arrival date & time: 11/06/19  1123     History Chief Complaint  Patient presents with  . Loss of Consciousness    Megan Taylor is a 74 y.o. female presenting to the emergency department via EMS after syncopal episode.  Patient states she was exercising as she normally does daily, began feeling lightheaded as if she were on a pass out.  She states she then sat down and had a syncopal episode.  On EMS arrival she was pale, diaphoretic and remained lightheaded.  Blood pressure was low she was given IV fluids.  CBG was 130s.  She denies prodrome of chest pain, palpitations, shortness of breath, headache, vision changes.  She states she currently feels tired though much better.  She states this is happened in the past when she exercised as well though it has been sometime.  She did not overexert herself or change her exercise today.  She states she ate yogurt for breakfast.  No recent illnesses.  No cardiac history with the exception of remote history of WPW that has been resolved s/p ablation in 1992.  The history is provided by the patient.       Past Medical History:  Diagnosis Date  . Dry eyes    uses Restasis eye drops.  . Hip pain    "bursitis right hip"  . Melanoma ([place])   . Osteopenia    was on fosamax for awhile  . Syncope and collapse 10/01/2016  . Tremor   . Wolff-Parkinson-White (WPW) syndrome 1992   heart ablation    Patient Active Problem List   Diagnosis Date Noted  . Adjustment reaction 07/13/2017  . Vitamin D deficiency 07/13/2017  . Routine health maintenance 07/13/2017  . B12 deficiency 07/13/2017  . Fatigue 07/13/2017  . Seroma of musculoskeletal structure after musculoskeletal system procedure 04/21/2017  . Greater trochanteric bursitis of right hip 01/27/2017  . Lumbar spondylosis 01/26/2017  . Syncope and collapse 10/01/2016  . Tendinopathy of right gluteus medius  02/17/2016  . METATARSALGIA 02/05/2009  . BUNION, LEFT FOOT 02/05/2009  . FOOT PAIN, BILATERAL 02/05/2009  . TALIPES CAVUS 02/05/2009    Past Surgical History:  Procedure Laterality Date  . BREAST BIOPSY  1975   benign lump  . BREAST CYST ASPIRATION     benign  . BUNIONECTOMY Left   . COLONOSCOPY    . DILATION AND CURETTAGE OF UTERUS  1977   with BTL  . EXCISION/RELEASE BURSA HIP Right 02/17/2016   Procedure: RIGHT HIP BURSECTOMY GLUTEAL TENDON REPAIR;  Surgeon: Gaynelle Arabian, MD;  Location: [facility];  Service: Orthopedics;  Laterality: Right;  . heart ablation  1992   [place]"Wolfe-Parkinson white syndrome"  . LUMBAR DISC SURGERY  2018   [place] - decompression, foraminectomy.   Marland Kitchen MELANOMA EXCISION Right 06/2009   right ankle  . TONSILLECTOMY    . WISDOM TOOTH EXTRACTION       OB History    Gravida  2   Para  2   Term  0   Preterm  0   AB  0   Living  2     SAB  0   TAB  0   Ectopic  0   Multiple  0   Live Births  2           Family History  Problem Relation Age of Onset  . Lung cancer Father 54  . Hypertension  Mother   . Hyperlipidemia Mother   . Congestive Heart Failure Mother   . Osteoporosis Sister 15  . Hodgkin's lymphoma Paternal Grandfather   . Hypertension Maternal Grandmother   . Hyperlipidemia Maternal Grandmother   . Congestive Heart Failure Paternal Grandmother   . Tremor Daughter   . Colon cancer Neg Hx   . Esophageal cancer Neg Hx   . Liver cancer Neg Hx   . Pancreatic cancer Neg Hx   . Rectal cancer Neg Hx   . Stomach cancer Neg Hx     Social History   Tobacco Use  . Smoking status: Never Smoker  . Smokeless tobacco: Never Used  Substance Use Topics  . Alcohol use: Yes    Alcohol/week: 5.0 - 6.0 standard drinks    Types: 5 - 6 Standard drinks or equivalent per week    Comment: wine  . Drug use: No    Home Medications Prior to Admission medications   Medication Sig Start Date End Date Taking? Authorizing Provider    Ascorbic Acid Buffered (BUFFERED VITAMIN C) 1000 MG CAPS Take 1,000 mg by mouth daily.    [provider]  azelastine (ASTELIN) 0.1 % nasal spray Place 2 sprays into both nostrils 2 (two) times daily. Use in each nostril as directed 11/07/18   Briscoe Deutscher, DO  B Complex Vitamins (B COMPLEX PO) Take 1 tablet by mouth daily.    [provider]  calcium carbonate (OS-CAL) 600 MG tablet Take 600 mg by mouth daily.    [provider]  Cholecalciferol (VITAMIN D) 2000 UNITS CAPS Take 2,000 Units by mouth daily.     [provider]  Coenzyme Q10 (COQ10) 100 MG CAPS Take 1 capsule by mouth daily.  10/12/16   [provider]  cycloSPORINE (RESTASIS) 0.05 % ophthalmic emulsion Place 1 drop into both eyes 2 (two) times daily.    [provider]  doxycycline (VIBRA-TABS) 100 MG tablet  01/24/19   [provider]  fish oil-omega-3 fatty acids 1000 MG capsule Take 1 g by mouth 2 (two) times daily.     [provider]  fluticasone (FLONASE) 50 MCG/ACT nasal spray Place 2 sprays into both nostrils daily. 09/02/18   Orma Flaming, MD  ibuprofen (ADVIL,MOTRIN) 200 MG tablet Take 200 mg by mouth as needed.    [provider]  mupirocin ointment (BACTROBAN) 2 % Place 1 application into the nose 2 (two) times daily. 05/16/19   Briscoe Deutscher, DO  Probiotic Product (PROBIOTIC DAILY PO) Take 1 capsule by mouth daily.     [provider]    Allergies    St. john's wort [st john's wort], Aleve [naproxen sodium], and Augmentin [amoxicillin-pot clavulanate]  Review of Systems   Review of Systems  Neurological: Positive for syncope and light-headedness.  All other systems reviewed and are negative.   Physical Exam Updated Vital Signs BP 121/77 Comment: Room air  Pulse 66 Comment: Room air  Temp 98.7 F (37.1 C) (Oral)   Resp 14   Ht 5\' 3"  (1.6 m)   Wt 49 kg   LMP 10/12/1997 (LMP Unknown)   SpO2 100%   BMI 19.13 kg/m    Physical Exam Vitals and nursing note reviewed.  Constitutional:      General: She is not in acute distress.    Appearance: She is well-developed. She is not ill-appearing.  HENT:     Head: Normocephalic and atraumatic.  Eyes:     Conjunctiva/sclera: Conjunctivae normal.  Cardiovascular:     Rate and Rhythm: Normal rate and regular rhythm.  Pulmonary:     Effort: Pulmonary effort is normal. No respiratory distress.     Breath sounds: Normal breath sounds.  Abdominal:     General: Bowel sounds are normal.     Palpations: Abdomen is soft.     Tenderness: There is no abdominal tenderness.  Musculoskeletal:     Right lower leg: No edema.     Left lower leg: No edema.  Skin:    General: Skin is warm.  Neurological:     Mental Status: She is alert.     Comments: Mental Status:  Alert, oriented, thought content appropriate, able to give a coherent history. Speech fluent without evidence of aphasia. Able to follow 2 step commands without difficulty.  Cranial Nerves:  II:  Peripheral visual fields grossly normal, pupils equal, round, reactive to light III,IV, VI: ptosis not present, extra-ocular motions intact bilaterally  V,VII: smile symmetric, facial light touch sensation equal VIII: hearing grossly normal to voice  X: uvula elevates symmetrically  XI: bilateral shoulder shrug symmetric and strong XII: midline tongue extension without fassiculations Motor:  Normal tone. 5/5 in upper and lower extremities bilaterally including strong and equal grip strength and dorsiflexion/plantar flexion Sensory: grossly normal in all extremities.  Cerebellar: normal finger-to-nose with bilateral upper extremities CV: distal pulses palpable throughout    Psychiatric:        Behavior: Behavior normal.     ED Results / Procedures / Treatments   Labs (all labs ordered are listed, but only abnormal results are displayed) Labs Reviewed  BASIC METABOLIC PANEL - Abnormal; Notable for the  following components:      Result Value   BUN 24 (*)    Calcium 8.5 (*)    All other components within normal limits  CBC WITH DIFFERENTIAL/PLATELET    EKG EKG Interpretation  Date/Time:  Monday November 06 2019 11:38:02 EST Ventricular Rate:  65 PR Interval:    QRS Duration: 106 QT Interval:  440 QTC Calculation: 458 R Axis:   -22 Text Interpretation: Sinus rhythm Probable left atrial enlargement Borderline left axis deviation Low voltage, precordial leads Consider anterior infarct no significant change since 2017 Confirmed by Sherwood Gambler [phone]) on 11/06/2019 12:27:53 PM   Radiology No results found.  Procedures Procedures (including critical care time)  Medications Ordered in ED Medications  sodium chloride 0.9 % bolus 1,000 mL (0 mLs Intravenous Stopped 11/06/19 1417)    ED Course  I have reviewed the triage vital signs and the nursing notes.  Pertinent labs & imaging results that were available during my care of the patient were reviewed by me and considered in my medical decision making (see chart for details).    MDM Rules/Calculators/A&P                      Patient presenting with syncope, likely vasovagal, that occurred while exercising today.  Prodrome of lightheadedness, and she felt like she was going to pass out.  She then lowered herself to the ground and had syncopal episode.  No injuries.  No chest pain, shortness of breath, headache, vision changes.  On exam she is well-appearing and in no distress.  Vital signs are normal. Patient without arrhythmia or tachycardia while here in the department.  Labs are reassuring. IV fluids given.  Patient ambulated in the ED without recurrence of symptoms.  Will plan for discharge home with close PCP follow-up.  Return precautions discussed.  Discussed results, findings, treatment and follow up. Patient advised of return precautions. Patient verbalized understanding and agreed with plan.  Final Clinical Impression(s)  / ED Diagnoses Final diagnoses:  Syncope and collapse    Rx / DC Orders ED Discharge Orders    None       Ketara Cavness, [place] N, PA-C 11/06/19 1452    Sherwood Gambler, MD 11/07/19 [phone]

## 2019-11-06 NOTE — Discharge Instructions (Signed)
It is important that you stay hydrated and eat a well-balanced diet. Follow-up closely with your primary care provider regarding your visit today. Return to the ER for worsening symptoms.

## 2019-11-06 NOTE — ED Triage Notes (Signed)
Pt arrived via EMS with c/o becoming simi unconscious, pale and diaphoretic. Pt was working out at time of incident. Pt reports this has happened before. Pt alert and oriented X4 in triage.

## 2019-11-06 NOTE — ED Notes (Signed)
Pt ambulated in hallway with steady gait. Tolerated well. Denies any dizziness, SOB and pain

## 2019-12-14 ENCOUNTER — Other Ambulatory Visit: Payer: Self-pay | Admitting: Family Medicine

## 2019-12-14 ENCOUNTER — Encounter: Payer: Self-pay | Admitting: Physician Assistant

## 2019-12-14 DIAGNOSIS — Z1231 Encounter for screening mammogram for malignant neoplasm of breast: Secondary | ICD-10-CM

## 2019-12-15 ENCOUNTER — Other Ambulatory Visit: Payer: Self-pay | Admitting: Physician Assistant

## 2019-12-15 ENCOUNTER — Other Ambulatory Visit: Payer: Self-pay

## 2019-12-15 ENCOUNTER — Ambulatory Visit
Admission: RE | Admit: 2019-12-15 | Discharge: 2019-12-15 | Disposition: A | Discharge status: Home / Self Care | Payer: Medicare Other | Source: Ambulatory Visit

## 2019-12-15 DIAGNOSIS — Z1231 Encounter for screening mammogram for malignant neoplasm of breast: Secondary | ICD-10-CM | POA: Diagnosis not present

## 2020-01-24 ENCOUNTER — Telehealth: Payer: Self-pay | Admitting: Physician Assistant

## 2020-01-24 NOTE — Telephone Encounter (Signed)
Left message for patient to call back and schedule Medicare Annual Wellness Visit (AWV) either virtually/audio only OR in office. Whatever the patients preference is.  Last AWV 01/26/19; please schedule AFTER 01/27/20 with [facility]-Nurse Health Advisor at [facility].

## 2020-02-06 ENCOUNTER — Ambulatory Visit (INDEPENDENT_AMBULATORY_CARE_PROVIDER_SITE_OTHER): Payer: Medicare Other

## 2020-02-06 ENCOUNTER — Other Ambulatory Visit: Payer: Self-pay

## 2020-02-06 VITALS — BP 112/64 | HR 76 | Temp 98.2°F | Ht 63.5 in | Wt 108.0 lb

## 2020-02-06 DIAGNOSIS — Z0000 Encounter for general adult medical examination without abnormal findings: Secondary | ICD-10-CM | POA: Diagnosis not present

## 2020-02-06 NOTE — Patient Instructions (Signed)
Megan Taylor , Thank you for taking time to come for your Medicare Wellness Visit. I appreciate your ongoing commitment to your health goals. Please review the following plan we discussed and let me know if I can assist you in the future.   Screening recommendations/referrals: Colorectal Screening: up to date; last colonoscopy 04/21/18 Mammogram: up to date; last 12/15/19 Bone Density: up to date; last 11/16/18  Vision and Dental Exams: Recommended annual ophthalmology exams for early detection of glaucoma and other disorders of the eye Recommended annual dental exams for proper oral hygiene  Vaccinations: Influenza vaccine: recommended yearly  Pneumococcal vaccine: up to date; last 09/08/16 Tdap vaccine: up to date; last 06/08/17  Shingles vaccine:  You may receive this vaccine at your local pharmacy. (see handout)   Advanced directives: Please bring a copy of your POA (Power of Attorney) and/or Living Will to your next appointment.  Goals: Recommend to drink at least 6-8 8oz glasses of water per day and consume a balanced diet rich in fresh fruits and vegetables.   Next appointment: Please schedule your Annual Wellness Visit with your Nurse Health Advisor in one year.  Preventive Care 72 Years and Older, Female Preventive care refers to lifestyle choices and visits with your health care provider that can promote health and wellness. What does preventive care include?  A yearly physical exam. This is also called an annual well check.  Dental exams once or twice a year.  Routine eye exams. Ask your health care provider how often you should have your eyes checked.  Personal lifestyle choices, including:  Daily care of your teeth and gums.  Regular physical activity.  Eating a healthy diet.  Avoiding tobacco and drug use.  Limiting alcohol use.  Practicing safe sex.  Taking low-dose aspirin every day if recommended by your health care provider.  Taking vitamin and mineral  supplements as recommended by your health care provider. What happens during an annual well check? The services and screenings done by your health care provider during your annual well check will depend on your age, overall health, lifestyle risk factors, and family history of disease. Counseling  Your health care provider may ask you questions about your:  Alcohol use.  Tobacco use.  Drug use.  Emotional well-being.  Home and relationship well-being.  Sexual activity.  Eating habits.  History of falls.  Memory and ability to understand (cognition).  Work and work Statistician.  Reproductive health. Screening  You may have the following tests or measurements:  Height, weight, and BMI.  Blood pressure.  Lipid and cholesterol levels. These may be checked every 5 years, or more frequently if you are over 64 years old.  Skin check.  Lung cancer screening. You may have this screening every year starting at age 70 if you have a 30-pack-year history of smoking and currently smoke or have quit within the past 15 years.  Fecal occult blood test (FOBT) of the stool. You may have this test every year starting at age 65.  Flexible sigmoidoscopy or colonoscopy. You may have a sigmoidoscopy every 5 years or a colonoscopy every 10 years starting at age 68.  Hepatitis C blood test.  Hepatitis B blood test.  Sexually transmitted disease (STD) testing.  Diabetes screening. This is done by checking your blood sugar (glucose) after you have not eaten for a while (fasting). You may have this done every 1-3 years.  Bone density scan. This is done to screen for osteoporosis. You may have this  done starting at age 70.  Mammogram. This may be done every 1-2 years. Talk to your health care provider about how often you should have regular mammograms. Talk with your health care provider about your test results, treatment options, and if necessary, the need for more tests. Vaccines  Your  health care provider may recommend certain vaccines, such as:  Influenza vaccine. This is recommended every year.  Tetanus, diphtheria, and acellular pertussis (Tdap, Td) vaccine. You may need a Td booster every 10 years.  Zoster vaccine. You may need this after age 79.  Pneumococcal 13-valent conjugate (PCV13) vaccine. One dose is recommended after age 45.  Pneumococcal polysaccharide (PPSV23) vaccine. One dose is recommended after age 42. Talk to your health care provider about which screenings and vaccines you need and how often you need them. This information is not intended to replace advice given to you by your health care provider. Make sure you discuss any questions you have with your health care provider. Document Released: 10/25/2015 Document Revised: 06/17/2016 Document Reviewed: 07/30/2015 Elsevier Interactive Patient Education  2017 [employer] Prevention in the Home Falls can cause injuries. They can happen to people of all ages. There are many things you can do to make your home safe and to help prevent falls. What can I do on the outside of my home?  Regularly fix the edges of walkways and driveways and fix any cracks.  Remove anything that might make you trip as you walk through a door, such as a raised step or threshold.  Trim any bushes or trees on the path to your home.  Use bright outdoor lighting.  Clear any walking paths of anything that might make someone trip, such as rocks or tools.  Regularly check to see if handrails are loose or broken. Make sure that both sides of any steps have handrails.  Any raised decks and porches should have guardrails on the edges.  Have any leaves, snow, or ice cleared regularly.  Use sand or salt on walking paths during winter.  Clean up any spills in your garage right away. This includes oil or grease spills. What can I do in the bathroom?  Use night lights.  Install grab bars by the toilet and in the tub and  shower. Do not use towel bars as grab bars.  Use non-skid mats or decals in the tub or shower.  If you need to sit down in the shower, use a plastic, non-slip stool.  Keep the floor dry. Clean up any water that spills on the floor as soon as it happens.  Remove soap buildup in the tub or shower regularly.  Attach bath mats securely with double-sided non-slip rug tape.  Do not have throw rugs and other things on the floor that can make you trip. What can I do in the bedroom?  Use night lights.  Make sure that you have a light by your bed that is easy to reach.  Do not use any sheets or blankets that are too big for your bed. They should not hang down onto the floor.  Have a firm chair that has side arms. You can use this for support while you get dressed.  Do not have throw rugs and other things on the floor that can make you trip. What can I do in the kitchen?  Clean up any spills right away.  Avoid walking on wet floors.  Keep items that you use a lot in easy-to-reach places.  If you need to reach something above you, use a strong step stool that has a grab bar.  Keep electrical cords out of the way.  Do not use floor polish or wax that makes floors slippery. If you must use wax, use non-skid floor wax.  Do not have throw rugs and other things on the floor that can make you trip. What can I do with my stairs?  Do not leave any items on the stairs.  Make sure that there are handrails on both sides of the stairs and use them. Fix handrails that are broken or loose. Make sure that handrails are as long as the stairways.  Check any carpeting to make sure that it is firmly attached to the stairs. Fix any carpet that is loose or worn.  Avoid having throw rugs at the top or bottom of the stairs. If you do have throw rugs, attach them to the floor with carpet tape.  Make sure that you have a light switch at the top of the stairs and the bottom of the stairs. If you do not  have them, ask someone to add them for you. What else can I do to help prevent falls?  Wear shoes that:  Do not have high heels.  Have rubber bottoms.  Are comfortable and fit you well.  Are closed at the toe. Do not wear sandals.  If you use a stepladder:  Make sure that it is fully opened. Do not climb a closed stepladder.  Make sure that both sides of the stepladder are locked into place.  Ask someone to hold it for you, if possible.  Clearly mark and make sure that you can see:  Any grab bars or handrails.  First and last steps.  Where the edge of each step is.  Use tools that help you move around (mobility aids) if they are needed. These include:  Canes.  Walkers.  Scooters.  Crutches.  Turn on the lights when you go into a dark area. Replace any light bulbs as soon as they burn out.  Set up your furniture so you have a clear path. Avoid moving your furniture around.  If any of your floors are uneven, fix them.  If there are any pets around you, be aware of where they are.  Review your medicines with your doctor. Some medicines can make you feel dizzy. This can increase your chance of falling. Ask your doctor what other things that you can do to help prevent falls. This information is not intended to replace advice given to you by your health care provider. Make sure you discuss any questions you have with your health care provider. Document Released: 07/25/2009 Document Revised: 03/05/2016 Document Reviewed: 11/02/2014 Elsevier Interactive Patient Education  2017 [employer].

## 2020-02-06 NOTE — Progress Notes (Addendum)
Subjective:   Megan Taylor is a 74 y.o. female who presents for Medicare Annual (Subsequent) preventive examination.  Review of Systems:   Cardiac Risk Factors include: advanced age (>59men, >27 women);dyslipidemia    Objective:     Vitals: BP 112/64   Pulse 76   Temp 98.2 F (36.8 C) (Temporal)   Ht 5' 3.5" (1.613 m)   Wt 108 lb (49 kg)   LMP 10/12/1997 (LMP Unknown)   SpO2 96%   BMI 18.83 kg/m   Body mass index is 18.83 kg/m.  Advanced Directives 02/06/2020 11/06/2019 01/26/2019 09/09/2017 01/28/2017 02/17/2016 02/12/2016  Does Patient Have a Medical Advance Directive? Yes Yes Yes Yes Yes Yes Yes  Type of Advance Directive Living will;Healthcare Power of [place];Living will - Living will;Healthcare Power of [place];Living will Living will;Healthcare Power of Attorney Living will;Healthcare Power of Attorney  Does patient want to make changes to medical advance directive? No - Patient declined No - Patient declined - No - Patient declined - - No - Patient declined  Copy of [place] in Chart? No - copy requested No - copy requested, Physician notified - No - copy requested - - No - copy requested    Tobacco Social History   Tobacco Use  Smoking Status Never Smoker  Smokeless Tobacco Never Used     Counseling given: Not Answered   Clinical Intake:  Pre-visit preparation completed: Yes  Pain : Faces Faces Pain Scale: Hurts a little bit Pain Type: Acute pain Pain Location: Head Pain Descriptors / Indicators: Aching  Faces Pain Scale: Hurts a little bit  Diabetes: No  How often do you need to have someone help you when you read instructions, pamphlets, or other written materials from your doctor or pharmacy?: 1 - Never  Interpreter Needed?: No  Information entered by :: Denman George LPN  Past Medical History:  Diagnosis Date  . Dry eyes    uses Restasis eye drops.  . Hip pain     "bursitis right hip"  . Melanoma ([place])   . Osteopenia    was on fosamax for awhile  . Syncope and collapse 10/01/2016  . Tremor   . Wolff-Parkinson-White (WPW) syndrome 1992   heart ablation   Past Surgical History:  Procedure Laterality Date  . BREAST BIOPSY  1975   benign lump  . BREAST CYST ASPIRATION     benign  . BUNIONECTOMY Left   . COLONOSCOPY    . DILATION AND CURETTAGE OF UTERUS  1977   with BTL  . EXCISION/RELEASE BURSA HIP Right 02/17/2016   Procedure: RIGHT HIP BURSECTOMY GLUTEAL TENDON REPAIR;  Surgeon: Gaynelle Arabian, MD;  Location: [facility];  Service: Orthopedics;  Laterality: Right;  . heart ablation  1992   [place]"Wolfe-Parkinson white syndrome"  . LUMBAR DISC SURGERY  2018   [place] - decompression, foraminectomy.   Marland Kitchen MELANOMA EXCISION Right 06/2009   right ankle  . TONSILLECTOMY    . WISDOM TOOTH EXTRACTION     Family History  Problem Relation Age of Onset  . Lung cancer Father 62  . Hypertension Mother   . Hyperlipidemia Mother   . Congestive Heart Failure Mother   . Osteoporosis Sister 44  . Hodgkin's lymphoma Paternal Grandfather   . Hypertension Maternal Grandmother   . Hyperlipidemia Maternal Grandmother   . Congestive Heart Failure Paternal Grandmother   . Tremor Daughter   . Colon cancer Neg Hx   .  Esophageal cancer Neg Hx   . Liver cancer Neg Hx   . Pancreatic cancer Neg Hx   . Rectal cancer Neg Hx   . Stomach cancer Neg Hx    Social History   Socioeconomic History  . Marital status: Married    Spouse name: Not on file  . Number of children: 2  . Years of education: college  . Highest education level: Not on file  Occupational History  . Occupation: Retired    Comment: Hydrographic surveyor business Gaffer)   Tobacco Use  . Smoking status: Never Smoker  . Smokeless tobacco: Never Used  Substance and Sexual Activity  . Alcohol use: Yes    Alcohol/week: 5.0 - 6.0 standard drinks    Types: 5 - 6 Standard drinks or equivalent per week     Comment: wine  . Drug use: No  . Sexual activity: Yes    Partners: Male    Birth control/protection: Post-menopausal  Other Topics Concern  . Not on file  Social History Narrative   Drinks about 1/2 cup of coffee a day    Enjoys gardening    4 grandchildren    Previously lived in [place] before retiring    Social Determinants of Radio broadcast assistant Strain:   . Difficulty of Paying Living Expenses:   Food Insecurity:   . Worried About Charity fundraiser in the Last Year:   . Arboriculturist in the Last Year:   Transportation Needs:   . Film/video editor (Medical):   Marland Kitchen Lack of Transportation (Non-Medical):   Physical Activity:   . Days of Exercise per Week:   . Minutes of Exercise per Session:   Stress:   . Feeling of Stress :   Social Connections:   . Frequency of Communication with Friends and Family:   . Frequency of Social Gatherings with Friends and Family:   . Attends Religious Services:   . Active Member of Clubs or Organizations:   . Attends Archivist Meetings:   Marland Kitchen Marital Status:     Outpatient Encounter Medications as of 02/06/2020  Medication Sig  . Ascorbic Acid Buffered (BUFFERED VITAMIN C) 1000 MG CAPS Take 1,000 mg by mouth daily.  . B Complex Vitamins (B COMPLEX PO) Take 1 tablet by mouth daily.  . calcium carbonate (OS-CAL) 600 MG tablet Take 600 mg by mouth daily.  . Cholecalciferol (VITAMIN D) 2000 UNITS CAPS Take 2,000 Units by mouth daily.   . Coenzyme Q10 (COQ10) 100 MG CAPS Take 1 capsule by mouth daily.   . cycloSPORINE (RESTASIS) 0.05 % ophthalmic emulsion Place 1 drop into both eyes 2 (two) times daily.  . fish oil-omega-3 fatty acids 1000 MG capsule Take 1 g by mouth 2 (two) times daily.   Marland Kitchen ibuprofen (ADVIL,MOTRIN) 200 MG tablet Take 200 mg by mouth as needed.  . Probiotic Product (PROBIOTIC DAILY PO) Take 1 capsule by mouth daily.   . [DISCONTINUED] azelastine (ASTELIN) 0.1 % nasal spray Place 2 sprays into both  nostrils 2 (two) times daily. Use in each nostril as directed  . [DISCONTINUED] doxycycline (VIBRA-TABS) 100 MG tablet   . [DISCONTINUED] fluticasone (FLONASE) 50 MCG/ACT nasal spray Place 2 sprays into both nostrils daily.  . [DISCONTINUED] mupirocin ointment (BACTROBAN) 2 % Place 1 application into the nose 2 (two) times daily.   Facility-Administered Encounter Medications as of 02/06/2020  Medication  . 0.9 %  sodium chloride infusion    Activities of Daily  Living In your present state of health, do you have any difficulty performing the following activities: 02/06/2020  Hearing? N  Vision? N  Difficulty concentrating or making decisions? N  Walking or climbing stairs? N  Dressing or bathing? N  Doing errands, shopping? N  Preparing Food and eating ? N  Using the Toilet? N  In the past six months, have you accidently leaked urine? N  Do you have problems with loss of bowel control? N  Managing your Medications? N  Managing your Finances? N  Housekeeping or managing your Housekeeping? N  Some recent data might be hidden    Patient Care Team: Inda Coke, [place] as PCP - General (Physician Assistant) [employer], Rex Kras, LCSW as Social Worker (Licensed Clinical Social Worker) Danis, Kirke Corin, MD as Consulting Physician (Gastroenterology) Harriett Sine, MD as Consulting Physician (Dermatology) Katy Fitch, Darlina Guys, MD as Consulting Physician (Ophthalmology) Yisroel Ramming, Everardo All, MD as Consulting Physician (Obstetrics and Gynecology) Gaynelle Arabian, MD as Consulting Physician (Orthopedic Surgery) Gerda Diss, DO as Consulting Physician (Sports Medicine)    Assessment:   This is a routine wellness examination for [employer].  Exercise Activities and Dietary recommendations Current Exercise Habits: Home exercise routine;Structured exercise class, Type of exercise: walking;Other - see comments(personal trainer), Time (Minutes): 45, Frequency (Times/Week): 5, Weekly  Exercise (Minutes/Week): 225, Intensity: Moderate  Goals   None     Fall Risk Fall Risk  02/06/2020 01/26/2019 11/07/2018 08/31/2018 09/09/2017  Falls in the past year? 0 0 0 0 No  Comment - - - Emmi Telephone Survey: data to providers prior to load -  Number falls in past yr: 0 - - - -  Injury with Fall? 0 - 0 - -  Follow up Falls evaluation completed;Education provided;Falls prevention discussed - - - -   Is the patient's home free of loose throw rugs in walkways, pet beds, electrical cords, etc?   yes      Grab bars in the bathroom? yes      Handrails on the stairs?   yes      Adequate lighting?   yes  Timed Get Up and Go performed: completed and within normal timeframe; no gait abnormalities noted   Depression Screen PHQ 2/9 Scores 02/06/2020 01/26/2019 09/09/2017 07/13/2017  PHQ - 2 Score 0 1 3 4   PHQ- 9 Score - - 6 9     Cognitive Function     6CIT Screen 02/06/2020  What Year? 0 points  What month? 0 points  What time? 0 points  Count back from 20 0 points  Months in reverse 0 points  Repeat phrase 0 points  Total Score 0    Immunization History  Administered Date(s) Administered  . Tdap 06/08/2017  . Zoster 08/09/2014    Qualifies for Shingles Vaccine?Discussed and patient will check with pharmacy for coverage.  Patient education handout provided   Screening Tests Health Maintenance  Topic Date Due  . COVID-19 Vaccine (1) Never done  . PNA vac Low Risk Adult (1 of 2 - PCV13) Never done  . MAMMOGRAM  12/14/2020  . TETANUS/TDAP  06/09/2027  . COLONOSCOPY  04/21/2028  . DEXA SCAN  Completed  . Hepatitis C Screening  Completed  . INFLUENZA VACCINE  Discontinued    Cancer Screenings: Lung: Low Dose CT Chest recommended if Age 11-80 years, 30 pack-year currently smoking OR have quit w/in 15years. Patient does not qualify. Breast:  Up to date on Mammogram? Yes  Up to date of Bone Density/Dexa? Yes Colorectal: colonoscopy 04/21/18 with Dr. Loletha Carrow     Plan:   I have personally reviewed and addressed the Medicare Annual Wellness questionnaire and have noted the following in the patient's chart:  A. Medical and social history B. Use of alcohol, tobacco or illicit drugs  C. Current medications and supplements D. Functional ability and status E.  Nutritional status F.  Physical activity G. Advance directives H. List of other physicians I.  Hospitalizations, surgeries, and ER visits in previous 12 months J.  [place] such as hearing and vision if needed, cognitive and depression L. Referrals, records requested, and appointments- none   In addition, I have reviewed and discussed with patient certain preventive protocols, quality metrics, and best practice recommendations. A written personalized care plan for preventive services as well as general preventive health recommendations were provided to patient.   Signed,  Denman George, LPN  Nurse Health Advisor   Nurse Notes: Appointment scheduled for follow up patient states that she recently had a sharp pain in neck in area of carotid artery that went upward into head.  Has hx of syncope and hyperlipidemia and would like carotid US if indicated    I have reviewed documentation for AWV and Advance Care planning provided by [facility], I agree with documentation, I was immediately available for any questions. Inda Coke, [place]

## 2020-02-12 ENCOUNTER — Ambulatory Visit: Payer: Medicare Other | Admitting: Physician Assistant

## 2020-06-02 IMAGING — MG DIGITAL SCREENING BILAT W/ TOMO W/ CAD
6 of 10 series · 6 of 30 positions shown · non-contrast
Comparison: Previous exam(s).

CLINICAL DATA: Screening.

EXAM:
DIGITAL SCREENING BILATERAL MAMMOGRAM WITH TOMO AND CAD

[R CC synth-2D]
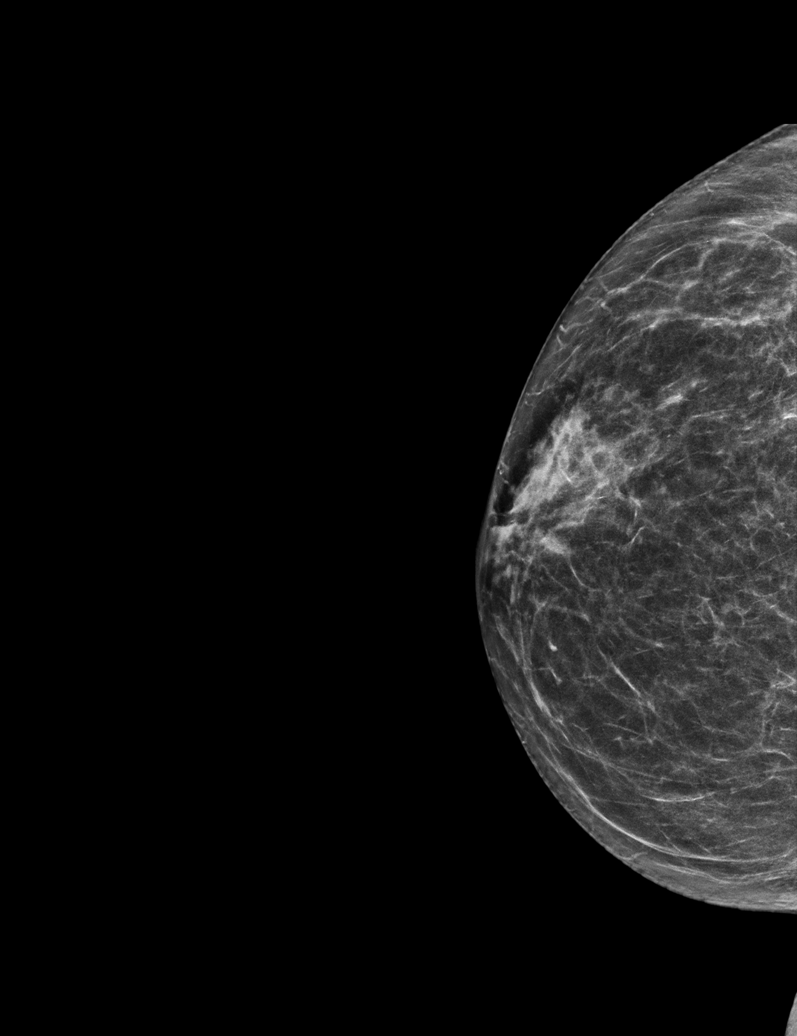

[R MLO synth-2D (1 of 2)]
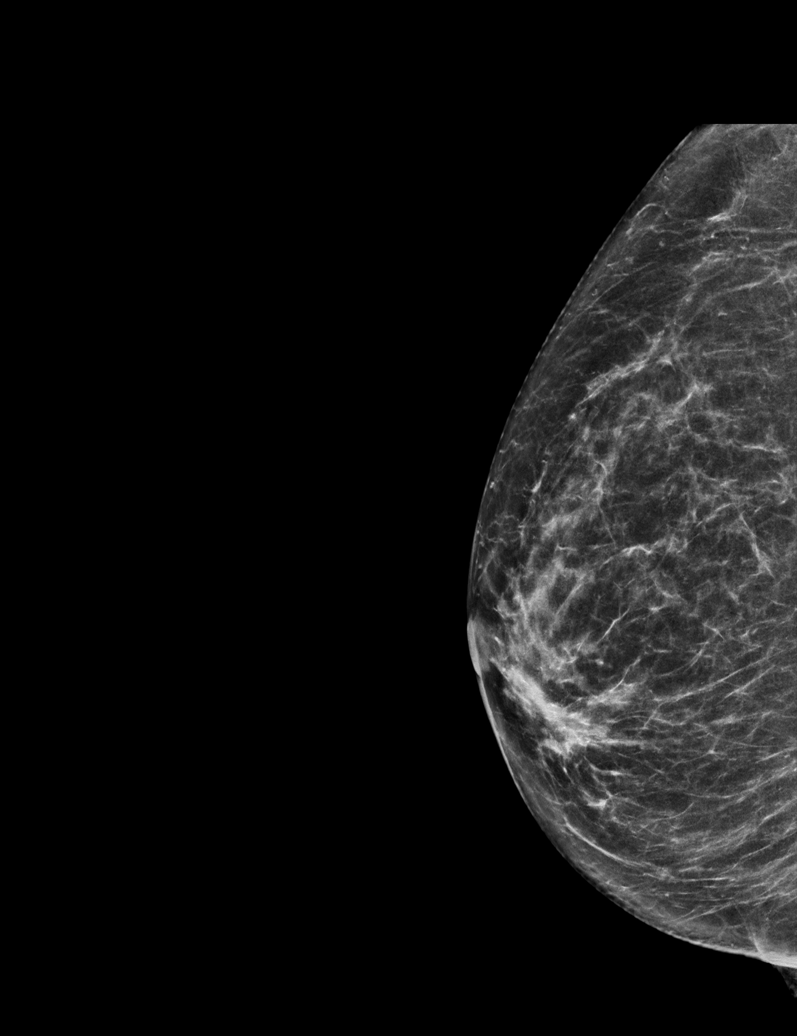

[L CC synth-2D]
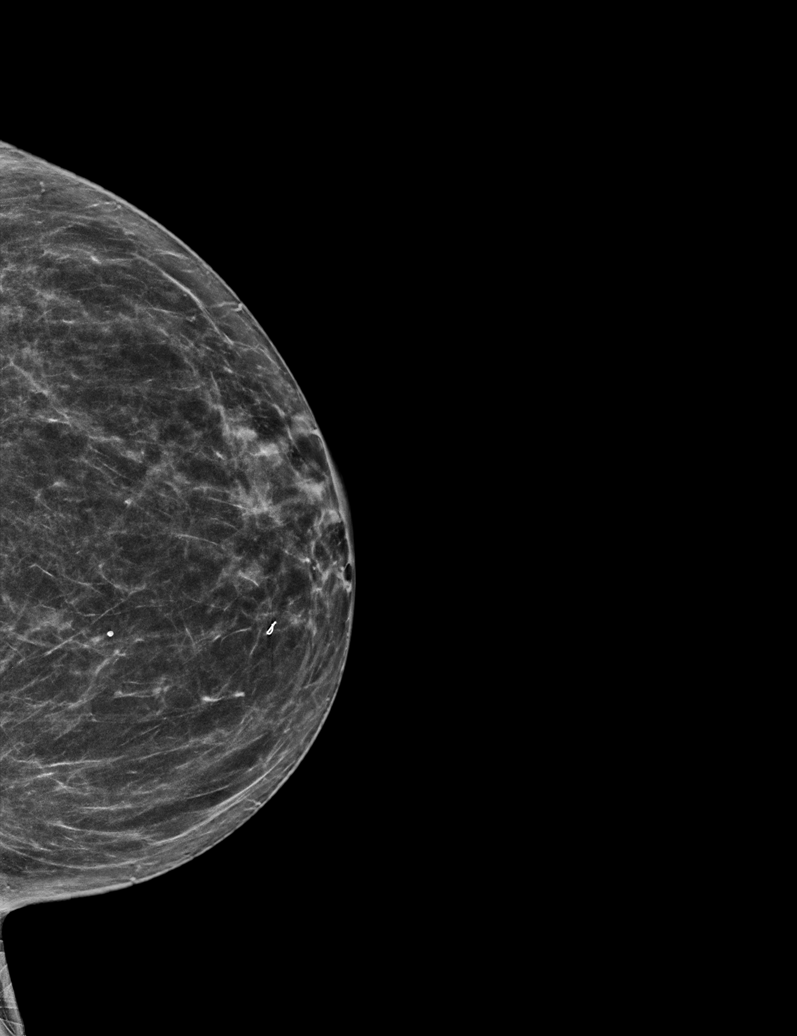

[R MLO synth-2D (2 of 2)]
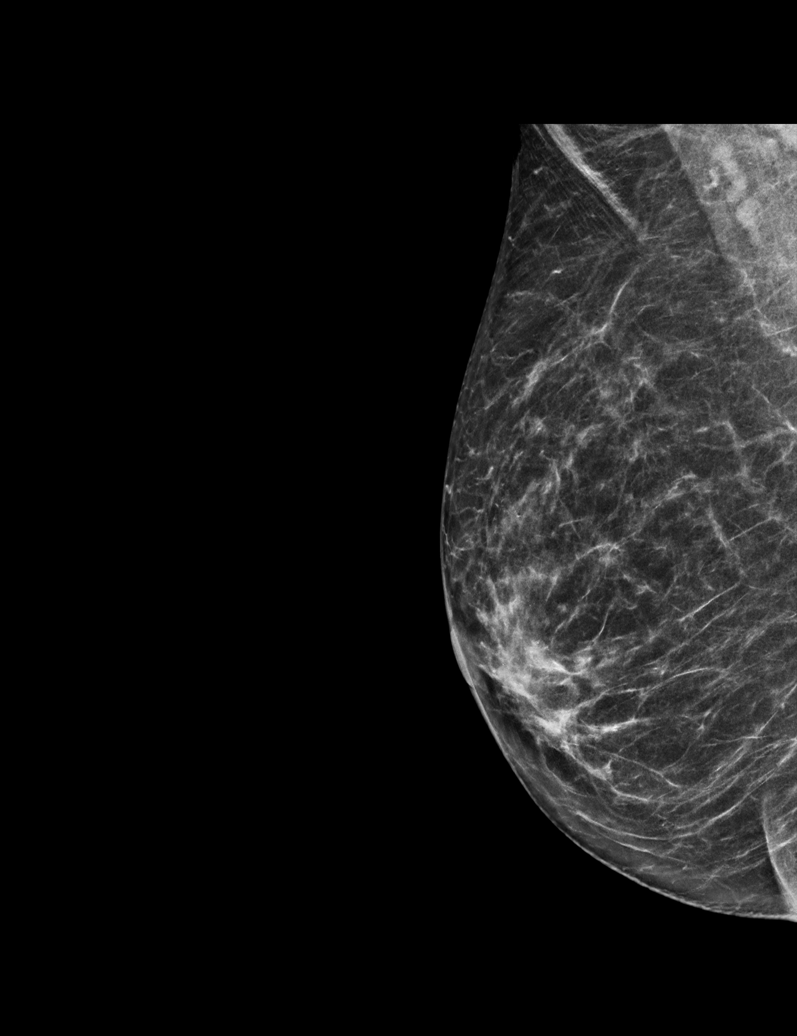

[L MLO synth-2D]
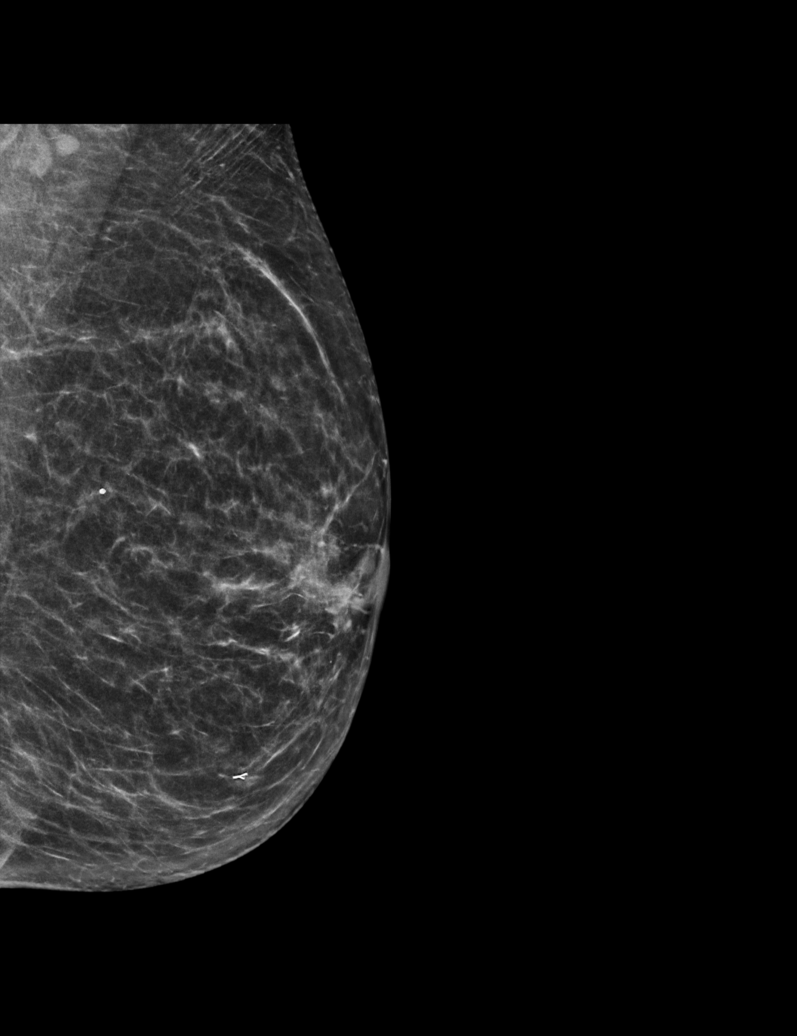

[R MLO tomo · tomo slice 31/60.0]
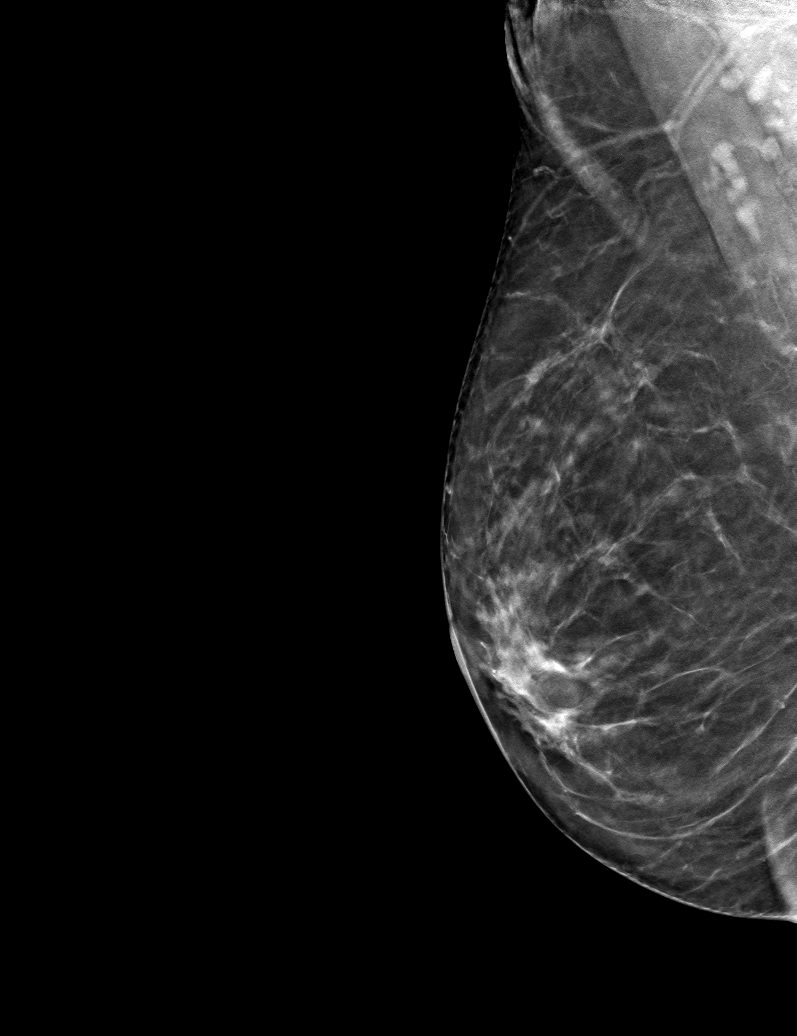

[6 of 30 positions shown; findings below may reference images not displayed]

ACR Breast Density Category b: There are scattered areas of
fibroglandular density.
FINDINGS: There are no findings suspicious for malignancy. Images were
processed with CAD.
IMPRESSION: No mammographic evidence of malignancy. A result letter of this
screening mammogram will be mailed directly to the patient.

RECOMMENDATION:
Screening mammogram in one year. (Code:[id number])

BI-RADS CATEGORY  1: Negative.

## 2020-11-18 ENCOUNTER — Encounter: Payer: Self-pay | Admitting: Physician Assistant

## 2020-11-18 NOTE — Telephone Encounter (Signed)
Please call pt and schedule TOC.

## 2020-11-18 NOTE — Telephone Encounter (Signed)
Scheduled pt with Inda Coke on 01/14/2021.

## 2020-11-19 DIAGNOSIS — H16223 Keratoconjunctivitis sicca, not specified as Sjogren's, bilateral: Secondary | ICD-10-CM | POA: Diagnosis not present

## 2020-11-19 DIAGNOSIS — H43392 Other vitreous opacities, left eye: Secondary | ICD-10-CM | POA: Diagnosis not present

## 2020-11-19 DIAGNOSIS — H52203 Unspecified astigmatism, bilateral: Secondary | ICD-10-CM | POA: Diagnosis not present

## 2020-11-19 DIAGNOSIS — H2513 Age-related nuclear cataract, bilateral: Secondary | ICD-10-CM | POA: Diagnosis not present

## 2020-11-19 DIAGNOSIS — H524 Presbyopia: Secondary | ICD-10-CM | POA: Diagnosis not present

## 2020-11-19 DIAGNOSIS — H5213 Myopia, bilateral: Secondary | ICD-10-CM | POA: Diagnosis not present

## 2020-11-21 ENCOUNTER — Other Ambulatory Visit: Payer: Self-pay

## 2020-11-21 ENCOUNTER — Ambulatory Visit (INDEPENDENT_AMBULATORY_CARE_PROVIDER_SITE_OTHER): Payer: Medicare Other | Admitting: Physician Assistant

## 2020-11-21 ENCOUNTER — Encounter: Payer: Self-pay | Admitting: Physician Assistant

## 2020-11-21 VITALS — BP 116/70 | HR 90 | Temp 98.2°F | Ht 63.5 in | Wt 104.5 lb

## 2020-11-21 DIAGNOSIS — R350 Frequency of micturition: Secondary | ICD-10-CM | POA: Diagnosis not present

## 2020-11-21 LAB — POCT URINALYSIS DIPSTICK
Bilirubin, UA: NEGATIVE
Glucose, UA: NEGATIVE
Ketones, UA: NEGATIVE
Nitrite, UA: NEGATIVE
Protein, UA: NEGATIVE
Spec Grav, UA: 1.015 (ref 1.010–1.025)
Urobilinogen, UA: 0.2 E.U./dL
pH, UA: 6 (ref 5.0–8.0)

## 2020-11-21 MED ORDER — NITROFURANTOIN MONOHYD MACRO 100 MG PO CAPS: 100.0000 mg | ORAL_CAPSULE | Freq: Two times a day (BID) | ORAL | 0 refills | Status: AC

## 2020-11-21 NOTE — Progress Notes (Signed)
Acute Office Visit  Subjective:    Patient ID: Megan Taylor, female    DOB: 06-Jun-1946, 75 y.o.   MRN: [id number]  Chief Complaint  Patient presents with  . Urinary Frequency    Pt c/o frequency with urination and urgency started on Tuesday. Denies fever or chills, no back pain or pelvic pain.    HPI Patient is in today for urinary frequency and urgency x 2 days. She has had a few UTI's in the past and says this feels the same. No other symptoms.   Past Medical History:  Diagnosis Date  . Dry eyes    uses Restasis eye drops.  . Hip pain    "bursitis right hip"  . Melanoma ([place])   . Osteopenia    was on fosamax for awhile  . Syncope and collapse 10/01/2016  . Tremor   . Wolff-Parkinson-White (WPW) syndrome 1992   heart ablation    Past Surgical History:  Procedure Laterality Date  . BREAST BIOPSY  1975   benign lump  . BREAST CYST ASPIRATION     benign  . BUNIONECTOMY Left   . COLONOSCOPY    . DILATION AND CURETTAGE OF UTERUS  1977   with BTL  . EXCISION/RELEASE BURSA HIP Right 02/17/2016   Procedure: RIGHT HIP BURSECTOMY GLUTEAL TENDON REPAIR;  Surgeon: Gaynelle Arabian, MD;  [facility];  Service: Orthopedics;  Laterality: Right;  . heart ablation  1992   [place]"Wolfe-Parkinson white syndrome"  . LUMBAR DISC SURGERY  2018   [place] - decompression, foraminectomy.   Marland Kitchen MELANOMA EXCISION Right 06/2009   right ankle  . TONSILLECTOMY    . WISDOM TOOTH EXTRACTION      Family History  Problem Relation Age of Onset  . Lung cancer Father 75  . Hypertension Mother   . Hyperlipidemia Mother   . Congestive Heart Failure Mother   . Osteoporosis Sister 72  . Hodgkin's lymphoma Paternal Grandfather   . Hypertension Maternal Grandmother   . Hyperlipidemia Maternal Grandmother   . Congestive Heart Failure Paternal Grandmother   . Tremor Daughter   . Colon cancer Neg Hx   . Esophageal cancer Neg Hx   . Liver cancer Neg Hx   . Pancreatic cancer Neg Hx   . Rectal  cancer Neg Hx   . Stomach cancer Neg Hx     Social History   Socioeconomic History  . Marital status: Married    Spouse name: Not on file  . Number of children: 2  . Years of education: college  . Highest education level: Not on file  Occupational History  . Occupation: Retired    Comment: Hydrographic surveyor business Gaffer)   Tobacco Use  . Smoking status: Never Smoker  . Smokeless tobacco: Never Used  Vaping Use  . Vaping Use: Never used  Substance and Sexual Activity  . Alcohol use: Yes    Alcohol/week: 5.0 - 6.0 standard drinks    Types: 5 - 6 Standard drinks or equivalent per week    Comment: wine  . Drug use: No  . Sexual activity: Yes    Partners: Male    Birth control/protection: Post-menopausal  Other Topics Concern  . Not on file  Social History Narrative   Drinks about 1/2 cup of coffee a day    Enjoys gardening    4 grandchildren    Previously lived in [place] before retiring    Social Determinants of Radio broadcast assistant Strain: Not on file  Food Insecurity: Not on file  Transportation Needs: Not on file  Physical Activity: Not on file  Stress: Not on file  Social Connections: Not on file  Intimate Partner Violence: Not on file    Outpatient Medications Prior to Visit  Medication Sig Dispense Refill  . Ascorbic Acid Buffered (BUFFERED VITAMIN C) 1000 MG CAPS Take 1,000 mg by mouth daily.    . B Complex Vitamins (B COMPLEX PO) Take 1 tablet by mouth daily.    . calcium carbonate (OS-CAL) 600 MG tablet Take 600 mg by mouth daily.    . Cholecalciferol (VITAMIN D) 2000 UNITS CAPS Take 2,000 Units by mouth daily.     . Coenzyme Q10 (COQ10) 100 MG CAPS Take 1 capsule by mouth daily.     . cycloSPORINE (RESTASIS) 0.05 % ophthalmic emulsion Place 1 drop into both eyes 2 (two) times daily.    . fish oil-omega-3 fatty acids 1000 MG capsule Take 1 g by mouth 2 (two) times daily.     . Multiple Vitamins-Minerals (ZINC PO) Take 20 mg by mouth daily in the  afternoon.    . Probiotic Product (PROBIOTIC DAILY PO) Take 1 capsule by mouth daily.     Marland Kitchen ibuprofen (ADVIL,MOTRIN) 200 MG tablet Take 200 mg by mouth as needed.     Facility-Administered Medications Prior to Visit  Medication Dose Route Frequency Provider Last Rate Last Admin  . 0.9 %  sodium chloride infusion  500 mL Intravenous Once Doran Stabler, MD        Allergies  Allergen Reactions  . St. John's Wort [St John's Wort]     Pt does not remember reaction  . Aleve [Naproxen Sodium] Other (See Comments)    Hot flashes, fever  . Augmentin [Amoxicillin-Pot Clavulanate] Other (See Comments)    Severe diarrhea after one dose    Review of Systems  Constitutional: Negative for chills and fever.  Gastrointestinal: Negative for abdominal pain, nausea and vomiting.  Genitourinary: Positive for dysuria, frequency and urgency. Negative for hematuria and pelvic pain.  Skin: Negative for rash.       Objective:    Physical Exam Vitals and nursing note reviewed.  Constitutional:      General: She is not in acute distress.    Appearance: Normal appearance. She is not ill-appearing.  HENT:     Head: Normocephalic and atraumatic.  Cardiovascular:     Rate and Rhythm: Normal rate and regular rhythm.     Pulses: Normal pulses.     Heart sounds: Normal heart sounds.  Pulmonary:     Effort: Pulmonary effort is normal.     Breath sounds: Normal breath sounds.  Abdominal:     General: Abdomen is flat. Bowel sounds are normal.     Palpations: Abdomen is soft.     Tenderness: There is no right CVA tenderness or left CVA tenderness.  Skin:    General: Skin is warm and dry.  Neurological:     General: No focal deficit present.     Mental Status: She is alert.  Psychiatric:        Mood and Affect: Mood normal.     BP 116/70 (BP Location: Left Arm, Patient Position: Sitting, Cuff Size: Normal)   Pulse 90   Temp 98.2 F (36.8 C) (Temporal)   Ht 5' 3.5" (1.613 m)   Wt 104 lb 8  oz (47.4 kg)   LMP 10/12/1997 (LMP Unknown)   SpO2 94%   BMI 18.22  kg/m  Wt Readings from Last 3 Encounters:  11/21/20 104 lb 8 oz (47.4 kg)  02/06/20 108 lb (49 kg)  11/06/19 108 lb (49 kg)    Health Maintenance Due  Topic Date Due  . PNA vac Low Risk Adult (1 of 2 - PCV13) Never done       Assessment & Plan:   Problem List Items Addressed This Visit   None   Visit Diagnoses    Frequency of urination    -  Primary   Relevant Orders   POCT urinalysis dipstick (Completed)     Urinalysis    Component Value Date/Time   BILIRUBINUR negative 11/21/2020 1410   PROTEINUR Negative 11/21/2020 1410   UROBILINOGEN 0.2 11/21/2020 1410   NITRITE negat 11/21/2020 1410   LEUKOCYTESUR Small (1+) (A) 11/21/2020 1410    1. Frequency of urination Start on the Macrobid 100 mg 1 tab po bid x 7 days. Push fluids. Culture urine, will call with results.    Brayn Eckstein M Josimar Corning, PA-C

## 2020-11-21 NOTE — Patient Instructions (Signed)
Take the macrobid as directed. I will send culture results through [place]. Keep pushing fluids. May try AZO for symptom relief. Recheck sooner if any sudden changes.    Urinary Tract Infection, Adult  A urinary tract infection (UTI) is an infection of any part of the urinary tract. The urinary tract includes the kidneys, ureters, bladder, and urethra. These organs make, store, and get rid of urine in the body. An upper UTI affects the ureters and kidneys. A lower UTI affects the bladder and urethra. What are the causes? Most urinary tract infections are caused by bacteria in your genital area around your urethra, where urine leaves your body. These bacteria grow and cause inflammation of your urinary tract. What increases the risk? You are more likely to develop this condition if:  You have a urinary catheter that stays in place.  You are not able to control when you urinate or have a bowel movement (incontinence).  You are female and you: ? Use a spermicide or diaphragm for birth control. ? Have low estrogen levels. ? Are pregnant.  You have certain genes that increase your risk.  You are sexually active.  You take antibiotic medicines.  You have a condition that causes your flow of urine to slow down, such as: ? An enlarged prostate, if you are female. ? Blockage in your urethra. ? A kidney stone. ? A nerve condition that affects your bladder control (neurogenic bladder). ? Not getting enough to drink, or not urinating often.  You have certain medical conditions, such as: ? Diabetes. ? A weak disease-fighting system (immunesystem). ? Sickle cell disease. ? Gout. ? Spinal cord injury. What are the signs or symptoms? Symptoms of this condition include:  Needing to urinate right away (urgency).  Frequent urination. This may include small amounts of urine each time you urinate.  Pain or burning with urination.  Blood in the urine.  Urine that smells bad or  unusual.  Trouble urinating.  Cloudy urine.  Vaginal discharge, if you are female.  Pain in the abdomen or the lower back. You may also have:  Vomiting or a decreased appetite.  Confusion.  Irritability or tiredness.  A fever or chills.  Diarrhea. The first symptom in older adults may be confusion. In some cases, they may not have any symptoms until the infection has worsened. How is this diagnosed? This condition is diagnosed based on your medical history and a physical exam. You may also have other tests, including:  Urine tests.  Blood tests.  Tests for STIs (sexually transmitted infections). If you have had more than one UTI, a cystoscopy or imaging studies may be done to determine the cause of the infections. How is this treated? Treatment for this condition includes:  Antibiotic medicine.  Over-the-counter medicines to treat discomfort.  Drinking enough water to stay hydrated. If you have frequent infections or have other conditions such as a kidney stone, you may need to see a health care provider who specializes in the urinary tract (urologist). In rare cases, urinary tract infections can cause sepsis. Sepsis is a life-threatening condition that occurs when the body responds to an infection. Sepsis is treated in the hospital with IV antibiotics, fluids, and other medicines. Follow these instructions at home: Medicines  Take over-the-counter and prescription medicines only as told by your health care provider.  If you were prescribed an antibiotic medicine, take it as told by your health care provider. Do not stop using the antibiotic even if you start  to feel better. General instructions  Make sure you: ? Empty your bladder often and completely. Do not hold urine for long periods of time. ? Empty your bladder after sex. ? Wipe from front to back after urinating or having a bowel movement if you are female. Use each tissue only one time when you  wipe.  Drink enough fluid to keep your urine pale yellow.  Keep all follow-up visits. This is important.   Contact a health care provider if:  Your symptoms do not get better after 1-2 days.  Your symptoms go away and then return. Get help right away if:  You have severe pain in your back or your lower abdomen.  You have a fever or chills.  You have nausea or vomiting. Summary  A urinary tract infection (UTI) is an infection of any part of the urinary tract, which includes the kidneys, ureters, bladder, and urethra.  Most urinary tract infections are caused by bacteria in your genital area.  Treatment for this condition often includes antibiotic medicines.  If you were prescribed an antibiotic medicine, take it as told by your health care provider. Do not stop using the antibiotic even if you start to feel better.  Keep all follow-up visits. This is important. This information is not intended to replace advice given to you by your health care provider. Make sure you discuss any questions you have with your health care provider. Document Revised: 05/10/2020 Document Reviewed: 05/10/2020 Elsevier Patient Education  [place].

## 2020-11-22 LAB — URINE CULTURE
MICRO NUMBER:: 11519649
SPECIMEN QUALITY:: ADEQUATE

## 2020-11-28 ENCOUNTER — Encounter: Payer: Self-pay | Admitting: Physician Assistant

## 2020-12-03 ENCOUNTER — Other Ambulatory Visit: Payer: Self-pay | Admitting: Physician Assistant

## 2020-12-03 DIAGNOSIS — M9905 Segmental and somatic dysfunction of pelvic region: Secondary | ICD-10-CM | POA: Diagnosis not present

## 2020-12-03 DIAGNOSIS — R531 Weakness: Secondary | ICD-10-CM | POA: Diagnosis not present

## 2020-12-03 DIAGNOSIS — M9906 Segmental and somatic dysfunction of lower extremity: Secondary | ICD-10-CM | POA: Diagnosis not present

## 2020-12-03 DIAGNOSIS — M9904 Segmental and somatic dysfunction of sacral region: Secondary | ICD-10-CM | POA: Diagnosis not present

## 2020-12-03 DIAGNOSIS — Z1231 Encounter for screening mammogram for malignant neoplasm of breast: Secondary | ICD-10-CM

## 2020-12-03 DIAGNOSIS — M9903 Segmental and somatic dysfunction of lumbar region: Secondary | ICD-10-CM | POA: Diagnosis not present

## 2020-12-03 DIAGNOSIS — M7601 Gluteal tendinitis, right hip: Secondary | ICD-10-CM | POA: Diagnosis not present

## 2020-12-03 DIAGNOSIS — M25551 Pain in right hip: Secondary | ICD-10-CM | POA: Diagnosis not present

## 2020-12-17 DIAGNOSIS — R531 Weakness: Secondary | ICD-10-CM | POA: Diagnosis not present

## 2020-12-17 DIAGNOSIS — M9905 Segmental and somatic dysfunction of pelvic region: Secondary | ICD-10-CM | POA: Diagnosis not present

## 2020-12-17 DIAGNOSIS — M9904 Segmental and somatic dysfunction of sacral region: Secondary | ICD-10-CM | POA: Diagnosis not present

## 2020-12-17 DIAGNOSIS — M9903 Segmental and somatic dysfunction of lumbar region: Secondary | ICD-10-CM | POA: Diagnosis not present

## 2020-12-17 DIAGNOSIS — M7601 Gluteal tendinitis, right hip: Secondary | ICD-10-CM | POA: Diagnosis not present

## 2020-12-17 DIAGNOSIS — M9906 Segmental and somatic dysfunction of lower extremity: Secondary | ICD-10-CM | POA: Diagnosis not present

## 2020-12-17 DIAGNOSIS — M25551 Pain in right hip: Secondary | ICD-10-CM | POA: Diagnosis not present

## 2021-01-06 ENCOUNTER — Other Ambulatory Visit: Payer: Self-pay

## 2021-01-06 ENCOUNTER — Ambulatory Visit
Admission: RE | Admit: 2021-01-06 | Discharge: 2021-01-06 | Disposition: A | Discharge status: Home / Self Care | Payer: Medicare Other | Source: Ambulatory Visit

## 2021-01-06 DIAGNOSIS — Z1231 Encounter for screening mammogram for malignant neoplasm of breast: Secondary | ICD-10-CM

## 2021-01-07 DIAGNOSIS — Z85820 Personal history of malignant melanoma of skin: Secondary | ICD-10-CM | POA: Diagnosis not present

## 2021-01-07 DIAGNOSIS — L821 Other seborrheic keratosis: Secondary | ICD-10-CM | POA: Diagnosis not present

## 2021-01-07 DIAGNOSIS — D1801 Hemangioma of skin and subcutaneous tissue: Secondary | ICD-10-CM | POA: Diagnosis not present

## 2021-01-07 DIAGNOSIS — L814 Other melanin hyperpigmentation: Secondary | ICD-10-CM | POA: Diagnosis not present

## 2021-01-14 ENCOUNTER — Encounter: Payer: Self-pay | Admitting: Physician Assistant

## 2021-01-14 ENCOUNTER — Other Ambulatory Visit: Payer: Self-pay

## 2021-01-14 ENCOUNTER — Ambulatory Visit (INDEPENDENT_AMBULATORY_CARE_PROVIDER_SITE_OTHER): Payer: Medicare Other | Admitting: Physician Assistant

## 2021-01-14 VITALS — BP 110/68 | HR 73 | Temp 98.1°F | Ht 63.5 in | Wt 105.5 lb

## 2021-01-14 DIAGNOSIS — M8580 Other specified disorders of bone density and structure, unspecified site: Secondary | ICD-10-CM | POA: Insufficient documentation

## 2021-01-14 DIAGNOSIS — E2839 Other primary ovarian failure: Secondary | ICD-10-CM

## 2021-01-14 DIAGNOSIS — E785 Hyperlipidemia, unspecified: Secondary | ICD-10-CM | POA: Diagnosis not present

## 2021-01-14 DIAGNOSIS — C439 Malignant melanoma of skin, unspecified: Secondary | ICD-10-CM

## 2021-01-14 LAB — LIPID PANEL
Cholesterol: 243 mg/dL — ABNORMAL HIGH (ref 0–200)
HDL: 108.5 mg/dL (ref >39.00)
LDL Cholesterol: 119 mg/dL — ABNORMAL HIGH (ref 0–99)
NonHDL: 134.64
Total CHOL/HDL Ratio: 2
Triglycerides: 80 mg/dL (ref 0.0–149.0)
VLDL: 16 mg/dL (ref 0.0–40.0)

## 2021-01-14 LAB — CBC WITH DIFFERENTIAL/PLATELET
Basophils Absolute: 0 10*3/uL (ref 0.0–0.1)
Basophils Relative: 1.2 % (ref 0.0–3.0)
Eosinophils Absolute: 0.1 10*3/uL (ref 0.0–0.7)
Eosinophils Relative: 1.7 % (ref 0.0–5.0)
HCT: 42.7 % (ref 36.0–46.0)
Hemoglobin: 14.6 g/dL (ref 12.0–15.0)
Lymphocytes Relative: 36.5 % (ref 12.0–46.0)
Lymphs Abs: 1.5 10*3/uL (ref 0.7–4.0)
MCHC: 34.3 g/dL (ref 30.0–36.0)
MCV: 95.4 fl (ref 78.0–100.0)
Monocytes Absolute: 0.4 10*3/uL (ref 0.1–1.0)
Monocytes Relative: 10.3 % (ref 3.0–12.0)
Neutro Abs: 2.1 10*3/uL (ref 1.4–7.7)
Neutrophils Relative %: 50.3 % (ref 43.0–77.0)
Platelets: 191 10*3/uL (ref 150.0–400.0)
RBC: 4.47 Mil/uL (ref 3.87–5.11)
RDW: 12.8 % (ref 11.5–15.5)
WBC: 4.2 10*3/uL (ref 4.0–10.5)

## 2021-01-14 LAB — COMPREHENSIVE METABOLIC PANEL
ALT: 23 U/L (ref 0–35)
AST: 24 U/L (ref 0–37)
Albumin: 4.4 g/dL (ref 3.5–5.2)
Alkaline Phosphatase: 48 U/L (ref 39–117)
BUN: 22 mg/dL (ref 6–23)
CO2: 30 mEq/L (ref 19–32)
Calcium: 9.4 mg/dL (ref 8.4–10.5)
Chloride: 102 mEq/L (ref 96–112)
Creatinine, Ser: 0.77 mg/dL (ref 0.40–1.20)
GFR: 76.99 mL/min (ref >60.00)
Glucose, Bld: 82 mg/dL (ref 70–99)
Potassium: 4.1 mEq/L (ref 3.5–5.1)
Sodium: 139 mEq/L (ref 135–145)
Total Bilirubin: 0.6 mg/dL (ref 0.2–1.2)
Total Protein: 6.5 g/dL (ref 6.0–8.3)

## 2021-01-14 NOTE — Patient Instructions (Signed)
It was great to see you!  Please schedule your DEXA with the front desk on your way out of the office.  We will update blood work today as well.  Take care,  Inda Coke PA-C

## 2021-01-14 NOTE — Progress Notes (Signed)
Megan Taylor is a 75 y.o. female is here for transfer of care.  I acted as a Education administrator for [employer], PA-C Anselmo Pickler, LPN   History of Present Illness:   Chief Complaint  Patient presents with  . Transfer of care    HPI  Pt here today for transfer of care from Dr. Juleen China.  Osteopenia -- was on fosamax but felt like it was causing jaw pain so she stopped this a few years back. No prior broken bones. Last DEXA 11/16/2018.  HLD -- currently on fish oil and bergamot. She has never been on rx cholesterol medications.  Melanoma -- hx of this, was on R ankle around 2015. Sees dermatology regularly for skin checks. Denies concerns presently.  There are no preventive care reminders to display for this patient.  Past Medical History:  Diagnosis Date  . Dry eyes    uses Restasis eye drops.  . Hip pain    "bursitis right hip"  . Melanoma ([place]) 2011   R ankle; sees dermatology regularly  . Osteopenia    was on fosamax for awhile  . Syncope and collapse 10/01/2016  . Tremor   . Wolff-Parkinson-White (WPW) syndrome 1992   heart ablation     Social History   Tobacco Use  . Smoking status: Never Smoker  . Smokeless tobacco: Never Used  Vaping Use  . Vaping Use: Never used  Substance Use Topics  . Alcohol use: Yes    Alcohol/week: 5.0 - 6.0 standard drinks    Types: 5 - 6 Standard drinks or equivalent per week    Comment: wine  . Drug use: No    Past Surgical History:  Procedure Laterality Date  . BREAST BIOPSY  1975   benign lump  . BREAST CYST ASPIRATION     benign  . BUNIONECTOMY Left   . COLONOSCOPY    . DILATION AND CURETTAGE OF UTERUS  1977   with BTL  . EXCISION/RELEASE BURSA HIP Right 02/17/2016   Procedure: RIGHT HIP BURSECTOMY GLUTEAL TENDON REPAIR;  Surgeon: Gaynelle Arabian, MD;  Location: [facility];  Service: Orthopedics;  Laterality: Right;  . heart ablation  1992   [place]"Wolfe-Parkinson white syndrome"  . LUMBAR DISC SURGERY  2018   [place] -  decompression, foraminectomy.   Marland Kitchen MELANOMA EXCISION Right 06/2009   right ankle  . TONSILLECTOMY    . WISDOM TOOTH EXTRACTION      Family History  Problem Relation Age of Onset  . Lung cancer Father 37  . Hypertension Mother   . Hyperlipidemia Mother   . Congestive Heart Failure Mother   . Osteoporosis Sister 42  . Hodgkin's lymphoma Paternal Grandfather   . Hypertension Maternal Grandmother   . Hyperlipidemia Maternal Grandmother   . Congestive Heart Failure Paternal Grandmother   . Tremor Daughter   . Colon cancer Neg Hx   . Esophageal cancer Neg Hx   . Liver cancer Neg Hx   . Pancreatic cancer Neg Hx   . Rectal cancer Neg Hx   . Stomach cancer Neg Hx     PMHx, SurgHx, SocialHx, FamHx, Medications, and Allergies were reviewed in the Visit Navigator and updated as appropriate.   Patient Active Problem List   Diagnosis Date Noted  . Osteopenia   . Adjustment reaction 07/13/2017  . Seroma of musculoskeletal structure after musculoskeletal system procedure 04/21/2017  . Greater trochanteric bursitis of right hip 01/27/2017  . Lumbar spondylosis 01/26/2017  . Syncope and collapse 10/01/2016  .  Tendinopathy of right gluteus medius 02/17/2016  . Melanoma ([place]) 2011  . METATARSALGIA 02/05/2009  . BUNION, LEFT FOOT 02/05/2009  . TALIPES CAVUS 02/05/2009  . Wolff-Parkinson-White (WPW) syndrome 1992    Social History   Tobacco Use  . Smoking status: Never Smoker  . Smokeless tobacco: Never Used  Vaping Use  . Vaping Use: Never used  Substance Use Topics  . Alcohol use: Yes    Alcohol/week: 5.0 - 6.0 standard drinks    Types: 5 - 6 Standard drinks or equivalent per week    Comment: wine  . Drug use: No    Current Medications and Allergies:    Current Outpatient Medications:  .  Ascorbic Acid Buffered (BUFFERED VITAMIN C) 1000 MG CAPS, Take 1,000 mg by mouth daily., Disp: , Rfl:  .  B Complex Vitamins (B COMPLEX PO), Take 1 tablet by mouth daily., Disp: , Rfl:  .   calcium carbonate (OS-CAL) 600 MG tablet, Take 600 mg by mouth daily., Disp: , Rfl:  .  celecoxib (CELEBREX) 100 MG capsule, Take 100 mg by mouth 2 (two) times daily as needed., Disp: , Rfl:  .  Cholecalciferol (VITAMIN D) 2000 UNITS CAPS, Take 2,000 Units by mouth daily. , Disp: , Rfl:  .  Coenzyme Q10 (COQ10) 100 MG CAPS, Take 1 capsule by mouth daily. , Disp: , Rfl:  .  cycloSPORINE (RESTASIS) 0.05 % ophthalmic emulsion, Place 1 drop into both eyes 2 (two) times daily., Disp: , Rfl:  .  fish oil-omega-3 fatty acids 1000 MG capsule, Take 1 g by mouth 2 (two) times daily. , Disp: , Rfl:  .  Multiple Vitamins-Minerals (ZINC PO), Take 20 mg by mouth daily in the afternoon., Disp: , Rfl:  .  Probiotic Product (PROBIOTIC DAILY PO), Take 1 capsule by mouth daily. , Disp: , Rfl:   Current Facility-Administered Medications:  .  0.9 %  sodium chloride infusion, 500 mL, Intravenous, Once, Danis, Kirke Corin, MD   Allergies  Allergen Reactions  . St. John's Wort [St John's Wort]     Pt does not remember reaction  . Aleve [Naproxen Sodium] Other (See Comments)    Hot flashes, fever  . Augmentin [Amoxicillin-Pot Clavulanate] Other (See Comments)    Severe diarrhea after one dose    Review of Systems   ROS Negative unless otherwise specified per HPI.  Vitals:   Vitals:   01/14/21 1010  BP: 110/68  Pulse: 73  Temp: 98.1 F (36.7 C)  TempSrc: Temporal  SpO2: 97%  Weight: 105 lb 8 oz (47.9 kg)  Height: 5' 3.5" (1.613 m)     Body mass index is 18.4 kg/m.   Physical Exam:    Physical Exam Vitals and nursing note reviewed.  Constitutional:      General: She is not in acute distress.    Appearance: She is well-developed. She is not ill-appearing or toxic-appearing.  Cardiovascular:     Rate and Rhythm: Normal rate and regular rhythm.     Pulses: Normal pulses.     Heart sounds: Normal heart sounds, S1 normal and S2 normal.     Comments: No LE edema Pulmonary:     Effort:  Pulmonary effort is normal.     Breath sounds: Normal breath sounds.  Skin:    General: Skin is warm and dry.  Neurological:     Mental Status: She is alert.     GCS: GCS eye subscore is 4. GCS verbal subscore is 5. GCS  motor subscore is 6.  Psychiatric:        Speech: Speech normal.        Behavior: Behavior normal. Behavior is cooperative.      Assessment and Plan:    Myan was seen today for transfer of care.  Diagnoses and all orders for this visit:  Malignant melanoma, unspecified site [facility]) Compliant with follow-up. No needs at this time.  Osteopenia, unspecified location; Estrogen deficiency Compliant with calcium and vitamin D. She is going for repeat DEXA. -     DG Bone Density; Future -     CBC with Differential/Platelet -     Comprehensive metabolic panel  Hyperlipidemia, unspecified hyperlipidemia type Update lipid panel. -     Lipid panel  CMA or LPN served as scribe during this visit. History, Physical, and Plan performed by medical provider. The above documentation has been reviewed and is accurate and complete.  Inda Coke, PA-C [place], [place] 01/14/2021  Follow-up: No follow-ups on file.

## 2021-01-21 ENCOUNTER — Ambulatory Visit (INDEPENDENT_AMBULATORY_CARE_PROVIDER_SITE_OTHER)
Admission: RE | Admit: 2021-01-21 | Discharge: 2021-01-21 | Disposition: A | Discharge status: Home / Self Care | Payer: Medicare Other | Source: Ambulatory Visit | Attending: Physician Assistant | Admitting: Physician Assistant

## 2021-01-21 ENCOUNTER — Other Ambulatory Visit: Payer: Self-pay

## 2021-01-21 DIAGNOSIS — E2839 Other primary ovarian failure: Secondary | ICD-10-CM

## 2021-01-21 DIAGNOSIS — M8580 Other specified disorders of bone density and structure, unspecified site: Secondary | ICD-10-CM

## 2021-01-24 ENCOUNTER — Encounter: Payer: Self-pay | Admitting: Physician Assistant

## 2021-02-14 ENCOUNTER — Other Ambulatory Visit: Payer: Self-pay | Admitting: Physician Assistant

## 2021-02-14 ENCOUNTER — Encounter: Payer: Self-pay | Admitting: Physician Assistant

## 2021-02-14 ENCOUNTER — Telehealth (INDEPENDENT_AMBULATORY_CARE_PROVIDER_SITE_OTHER): Payer: Medicare Other | Admitting: Physician Assistant

## 2021-02-14 DIAGNOSIS — R350 Frequency of micturition: Secondary | ICD-10-CM

## 2021-02-14 DIAGNOSIS — R399 Unspecified symptoms and signs involving the genitourinary system: Secondary | ICD-10-CM | POA: Diagnosis not present

## 2021-02-14 LAB — POC URINALSYSI DIPSTICK (AUTOMATED)
Bilirubin, UA: NEGATIVE
Blood, UA: POSITIVE
Glucose, UA: NEGATIVE
Ketones, UA: NEGATIVE
Nitrite, UA: NEGATIVE
Protein, UA: NEGATIVE
Spec Grav, UA: 1.01 (ref 1.010–1.025)
Urobilinogen, UA: 0.2 E.U./dL
pH, UA: 6 (ref 5.0–8.0)

## 2021-02-14 MED ORDER — NITROFURANTOIN MONOHYD MACRO 100 MG PO CAPS: 100.0000 mg | ORAL_CAPSULE | Freq: Two times a day (BID) | ORAL | 0 refills | Status: DC

## 2021-02-14 NOTE — Telephone Encounter (Signed)
See second message no further action needed

## 2021-02-14 NOTE — Progress Notes (Signed)
TELEPHONE ENCOUNTER   Patient verbally agreed to telephone visit and is aware that copayment and coinsurance may apply. Patient was treated using telemedicine according to accepted telemedicine protocols.  Location of the patient: home Location of provider: [place] Names of all persons participating in the telemedicine service and role in the encounter: Inda Coke, [place] , Roni Bread  Subjective:   Chief Complaint  Patient presents with  . Cystitis     HPI    UTI symptoms Woke up at 5am this morning. Constant need to urinate. Denies: back pain, fever, chills, malaise.   Yesterday has tried to push more fluids. No blood in urine. She was last seen in our office in Feb 2022 and had similar sx, started on macrobid but didn't have any positive findings on her urine culture.  She is sexually active with her husband and she is wondering if this could be causing some symptoms.   Patient Active Problem List   Diagnosis Date Noted  . Osteopenia   . Adjustment reaction 07/13/2017  . Seroma of musculoskeletal structure after musculoskeletal system procedure 04/21/2017  . Greater trochanteric bursitis of right hip 01/27/2017  . Lumbar spondylosis 01/26/2017  . Syncope and collapse 10/01/2016  . Tendinopathy of right gluteus medius 02/17/2016  . Melanoma ([place]) 2011  . METATARSALGIA 02/05/2009  . BUNION, LEFT FOOT 02/05/2009  . TALIPES CAVUS 02/05/2009  . Wolff-Parkinson-White (WPW) syndrome 1992   Social History   Tobacco Use  . Smoking status: Never Smoker  . Smokeless tobacco: Never Used  Substance Use Topics  . Alcohol use: Yes    Alcohol/week: 5.0 - 6.0 standard drinks    Types: 5 - 6 Standard drinks or equivalent per week    Comment: wine    Current Outpatient Medications:  .  Ascorbic Acid Buffered (BUFFERED VITAMIN C) 1000 MG CAPS, Take 1,000 mg by mouth daily., Disp: , Rfl:  .  B Complex Vitamins (B COMPLEX PO), Take 1 tablet by mouth daily.,  Disp: , Rfl:  .  calcium carbonate (OS-CAL) 600 MG tablet, Take 600 mg by mouth daily., Disp: , Rfl:  .  celecoxib (CELEBREX) 100 MG capsule, Take 100 mg by mouth 2 (two) times daily as needed., Disp: , Rfl:  .  Cholecalciferol (VITAMIN D) 2000 UNITS CAPS, Take 2,000 Units by mouth daily. , Disp: , Rfl:  .  Coenzyme Q10 (COQ10) 100 MG CAPS, Take 1 capsule by mouth daily. , Disp: , Rfl:  .  cycloSPORINE (RESTASIS) 0.05 % ophthalmic emulsion, Place 1 drop into both eyes 2 (two) times daily., Disp: , Rfl:  .  fish oil-omega-3 fatty acids 1000 MG capsule, Take 1 g by mouth 2 (two) times daily. , Disp: , Rfl:  .  Multiple Vitamins-Minerals (ZINC PO), Take 20 mg by mouth daily in the afternoon., Disp: , Rfl:  .  Probiotic Product (PROBIOTIC DAILY PO), Take 1 capsule by mouth daily. , Disp: , Rfl:   Current Facility-Administered Medications:  .  0.9 %  sodium chloride infusion, 500 mL, Intravenous, Once, Danis, Kirke Corin, MD Allergies  Allergen Reactions  . St. John's Wort [St John's Wort]     Pt does not remember reaction  . Aleve [Naproxen Sodium] Other (See Comments)    Hot flashes, fever  . Augmentin [Amoxicillin-Pot Clavulanate] Other (See Comments)    Severe diarrhea after one dose   Results for orders placed or performed in visit on 02/14/21  POCT Urinalysis Dipstick (Automated)  Result Value Ref Range   Color, UA yellow    Clarity, UA clear    Glucose, UA Negative Negative   Bilirubin, UA negative    Ketones, UA negative    Spec Grav, UA 1.010 1.010 - 1.025   Blood, UA positive    pH, UA 6.0 5.0 - 8.0   Protein, UA Negative Negative   Urobilinogen, UA 0.2 0.2 or 1.0 E.U./dL   Nitrite, UA negative    Leukocytes, UA Moderate (2+) (A) Negative    Assessment & Plan:   1. Urinary frequency  Symptoms consistent with possible UTI. Drop off UA shows moderate leuks and presence of blood. Will empirically treat with oral macrobid while awaiting culture results.  2. Genitourinary  complaints  Referral to gynecology to discuss vaginal atrophy and other health concerns, per patient request.    Orders Placed This Encounter  Procedures  . Urine Culture  . Ambulatory referral to Gynecology  . POCT Urinalysis Dipstick (Automated)   No orders of the defined types were placed in this encounter.   Inda Coke, [place] 02/14/2021  Time spent with the patient: 7 minutes, spent in obtaining information about her symptoms, reviewing her previous labs, evaluations, and treatments, counseling her about her condition (please see the discussed topics above), and developing a plan to further investigate it; she had a number of questions which I addressed.

## 2021-02-15 LAB — URINE CULTURE
MICRO NUMBER:: 11859707
SPECIMEN QUALITY:: ADEQUATE

## 2021-03-06 ENCOUNTER — Ambulatory Visit (INDEPENDENT_AMBULATORY_CARE_PROVIDER_SITE_OTHER): Payer: Medicare Other

## 2021-03-06 DIAGNOSIS — Z0000 Encounter for general adult medical examination without abnormal findings: Secondary | ICD-10-CM

## 2021-03-06 NOTE — Patient Instructions (Addendum)
Megan Taylor , Thank you for taking time to come for your Medicare Wellness Visit. I appreciate your ongoing commitment to your health goals. Please review the following plan we discussed and let me know if I can assist you in the future.   Screening recommendations/referrals: Colonoscopy: Done 04/21/18 Mammogram: Done 01/06/21 Bone Density: Done 01/21/21 Recommended yearly ophthalmology/optometry visit for glaucoma screening and checkup Recommended yearly dental visit for hygiene and checkup  Vaccinations: Influenza vaccine: Never Pneumococcal vaccine: Declined  Tdap vaccine: Up to date Shingles vaccine: Shingrix discussed. Please contact your pharmacy for coverage information.    Covid-19:Declined   Advanced directives: Please bring a copy of your health care power of attorney and living will to the office at your convenience.  Conditions/risks identified: Continue working with trainer and exercising   Next appointment: Follow up in one year for your annual wellness visit    Preventive Care 65 Years and Older, Female Preventive care refers to lifestyle choices and visits with your health care provider that can promote health and wellness. What does preventive care include?  A yearly physical exam. This is also called an annual well check.  Dental exams once or twice a year.  Routine eye exams. Ask your health care provider how often you should have your eyes checked.  Personal lifestyle choices, including:  Daily care of your teeth and gums.  Regular physical activity.  Eating a healthy diet.  Avoiding tobacco and drug use.  Limiting alcohol use.  Practicing safe sex.  Taking low-dose aspirin every day.  Taking vitamin and mineral supplements as recommended by your health care provider. What happens during an annual well check? The services and screenings done by your health care provider during your annual well check will depend on your age, overall health, lifestyle  risk factors, and family history of disease. Counseling  Your health care provider may ask you questions about your:  Alcohol use.  Tobacco use.  Drug use.  Emotional well-being.  Home and relationship well-being.  Sexual activity.  Eating habits.  History of falls.  Memory and ability to understand (cognition).  Work and work Statistician.  Reproductive health. Screening  You may have the following tests or measurements:  Height, weight, and BMI.  Blood pressure.  Lipid and cholesterol levels. These may be checked every 5 years, or more frequently if you are over 56 years old.  Skin check.  Lung cancer screening. You may have this screening every year starting at age 80 if you have a 30-pack-year history of smoking and currently smoke or have quit within the past 15 years.  Fecal occult blood test (FOBT) of the stool. You may have this test every year starting at age 55.  Flexible sigmoidoscopy or colonoscopy. You may have a sigmoidoscopy every 5 years or a colonoscopy every 10 years starting at age 44.  Hepatitis C blood test.  Hepatitis B blood test.  Sexually transmitted disease (STD) testing.  Diabetes screening. This is done by checking your blood sugar (glucose) after you have not eaten for a while (fasting). You may have this done every 1-3 years.  Bone density scan. This is done to screen for osteoporosis. You may have this done starting at age 27.  Mammogram. This may be done every 1-2 years. Talk to your health care provider about how often you should have regular mammograms. Talk with your health care provider about your test results, treatment options, and if necessary, the need for more tests. Vaccines  Your  health care provider may recommend certain vaccines, such as:  Influenza vaccine. This is recommended every year.  Tetanus, diphtheria, and acellular pertussis (Tdap, Td) vaccine. You may need a Td booster every 10 years.  Zoster vaccine.  You may need this after age 72.  Pneumococcal 13-valent conjugate (PCV13) vaccine. One dose is recommended after age 31.  Pneumococcal polysaccharide (PPSV23) vaccine. One dose is recommended after age 69. Talk to your health care provider about which screenings and vaccines you need and how often you need them. This information is not intended to replace advice given to you by your health care provider. Make sure you discuss any questions you have with your health care provider. Document Released: 10/25/2015 Document Revised: 06/17/2016 Document Reviewed: 07/30/2015 Elsevier Interactive Patient Education  2017 [place] Prevention in the Home Falls can cause injuries. They can happen to people of all ages. There are many things you can do to make your home safe and to help prevent falls. What can I do on the outside of my home?  Regularly fix the edges of walkways and driveways and fix any cracks.  Remove anything that might make you trip as you walk through a door, such as a raised step or threshold.  Trim any bushes or trees on the path to your home.  Use bright outdoor lighting.  Clear any walking paths of anything that might make someone trip, such as rocks or tools.  Regularly check to see if handrails are loose or broken. Make sure that both sides of any steps have handrails.  Any raised decks and porches should have guardrails on the edges.  Have any leaves, snow, or ice cleared regularly.  Use sand or salt on walking paths during winter.  Clean up any spills in your garage right away. This includes oil or grease spills. What can I do in the bathroom?  Use night lights.  Install grab bars by the toilet and in the tub and shower. Do not use towel bars as grab bars.  Use non-skid mats or decals in the tub or shower.  If you need to sit down in the shower, use a plastic, non-slip stool.  Keep the floor dry. Clean up any water that spills on the floor as soon  as it happens.  Remove soap buildup in the tub or shower regularly.  Attach bath mats securely with double-sided non-slip rug tape.  Do not have throw rugs and other things on the floor that can make you trip. What can I do in the bedroom?  Use night lights.  Make sure that you have a light by your bed that is easy to reach.  Do not use any sheets or blankets that are too big for your bed. They should not hang down onto the floor.  Have a firm chair that has side arms. You can use this for support while you get dressed.  Do not have throw rugs and other things on the floor that can make you trip. What can I do in the kitchen?  Clean up any spills right away.  Avoid walking on wet floors.  Keep items that you use a lot in easy-to-reach places.  If you need to reach something above you, use a strong step stool that has a grab bar.  Keep electrical cords out of the way.  Do not use floor polish or wax that makes floors slippery. If you must use wax, use non-skid floor wax.  Do not  have throw rugs and other things on the floor that can make you trip. What can I do with my stairs?  Do not leave any items on the stairs.  Make sure that there are handrails on both sides of the stairs and use them. Fix handrails that are broken or loose. Make sure that handrails are as long as the stairways.  Check any carpeting to make sure that it is firmly attached to the stairs. Fix any carpet that is loose or worn.  Avoid having throw rugs at the top or bottom of the stairs. If you do have throw rugs, attach them to the floor with carpet tape.  Make sure that you have a light switch at the top of the stairs and the bottom of the stairs. If you do not have them, ask someone to add them for you. What else can I do to help prevent falls?  Wear shoes that:  Do not have high heels.  Have rubber bottoms.  Are comfortable and fit you well.  Are closed at the toe. Do not wear sandals.  If  you use a stepladder:  Make sure that it is fully opened. Do not climb a closed stepladder.  Make sure that both sides of the stepladder are locked into place.  Ask someone to hold it for you, if possible.  Clearly mark and make sure that you can see:  Any grab bars or handrails.  First and last steps.  Where the edge of each step is.  Use tools that help you move around (mobility aids) if they are needed. These include:  Canes.  Walkers.  Scooters.  Crutches.  Turn on the lights when you go into a dark area. Replace any light bulbs as soon as they burn out.  Set up your furniture so you have a clear path. Avoid moving your furniture around.  If any of your floors are uneven, fix them.  If there are any pets around you, be aware of where they are.  Review your medicines with your doctor. Some medicines can make you feel dizzy. This can increase your chance of falling. Ask your doctor what other things that you can do to help prevent falls. This information is not intended to replace advice given to you by your health care provider. Make sure you discuss any questions you have with your health care provider. Document Released: 07/25/2009 Document Revised: 03/05/2016 Document Reviewed: 11/02/2014 Elsevier Interactive Patient Education  2017 [employer].

## 2021-03-06 NOTE — Progress Notes (Signed)
Virtual Visit via Telephone Note  I connected with  Megan Taylor on 03/06/21 at 11:00 AM EDT by telephone and verified that I am speaking with the correct person using two identifiers.  Medicare Annual Wellness visit completed telephonically due to Covid-19 pandemic.   Persons participating in this call: This Health Coach and this patient.   Location: Patient: Home Provider: Office    I discussed the limitations, risks, security and privacy concerns of performing an evaluation and management service by telephone and the availability of in person appointments. The patient expressed understanding and agreed to proceed.  Unable to perform video visit due to video visit attempted and failed and/or patient does not have video capability.   Some vital signs may be absent or patient reported.   Willette Brace, LPN    Subjective:   Megan Taylor is a 75 y.o. female who presents for Medicare Annual (Subsequent) preventive examination.  Review of Systems     Cardiac Risk Factors include: advanced age (>43men, >91 women)     Objective:    There were no vitals filed for this visit. There is no height or weight on file to calculate BMI.  Advanced Directives 03/06/2021 02/06/2020 11/06/2019 01/26/2019 09/09/2017 01/28/2017 02/17/2016  Does Patient Have a Medical Advance Directive? Yes Yes Yes Yes Yes Yes Yes  Type of Advance Directive Healthcare Power of Attorney Living will;Healthcare Power of [place];Living will - Living will;Healthcare Power of [place];Living will Living will;Healthcare Power of Attorney  Does patient want to make changes to medical advance directive? - No - Patient declined No - Patient declined - No - Patient declined - -  Copy of [place] in Chart? No - copy requested No - copy requested No - copy requested, Physician notified - No - copy requested - -    Current Medications  (verified) Outpatient Encounter Medications as of 03/06/2021  Medication Sig  . Ascorbic Acid Buffered (BUFFERED VITAMIN C) 1000 MG CAPS Take 1,000 mg by mouth daily.  . B Complex Vitamins (B COMPLEX PO) Take 1 tablet by mouth daily.  . calcium carbonate (OS-CAL) 600 MG tablet Take 600 mg by mouth daily.  . celecoxib (CELEBREX) 100 MG capsule Take 100 mg by mouth 2 (two) times daily as needed.  . Cholecalciferol (VITAMIN D) 2000 UNITS CAPS Take 2,000 Units by mouth daily.   . Coenzyme Q10 (COQ10) 100 MG CAPS Take 1 capsule by mouth daily.   . cycloSPORINE (RESTASIS) 0.05 % ophthalmic emulsion Place 1 drop into both eyes 2 (two) times daily.  . fish oil-omega-3 fatty acids 1000 MG capsule Take 1 g by mouth 2 (two) times daily.   . Multiple Vitamins-Minerals (ZINC PO) Take 20 mg by mouth daily in the afternoon.  . Probiotic Product (PROBIOTIC DAILY PO) Take 1 capsule by mouth daily.   . nitrofurantoin, macrocrystal-monohydrate, (MACROBID) 100 MG capsule Take 1 capsule (100 mg total) by mouth 2 (two) times daily. (Patient not taking: Reported on 03/06/2021)   Facility-Administered Encounter Medications as of 03/06/2021  Medication  . 0.9 %  sodium chloride infusion    Allergies (verified) St. john's wort [st john's wort], Aleve [naproxen sodium], and Augmentin [amoxicillin-pot clavulanate]   History: Past Medical History:  Diagnosis Date  . Dry eyes    uses Restasis eye drops.  . Hip pain    "bursitis right hip"  . Melanoma ([place]) 2011   R ankle; sees dermatology regularly  .  Osteopenia    was on fosamax for awhile  . Syncope and collapse 10/01/2016  . Tremor   . Wolff-Parkinson-White (WPW) syndrome 1992   heart ablation   Past Surgical History:  Procedure Laterality Date  . BREAST BIOPSY  1975   benign lump  . BREAST CYST ASPIRATION     benign  . BUNIONECTOMY Left   . COLONOSCOPY    . DILATION AND CURETTAGE OF UTERUS  1977   with BTL  . EXCISION/RELEASE BURSA HIP Right  02/17/2016   Procedure: RIGHT HIP BURSECTOMY GLUTEAL TENDON REPAIR;  Surgeon: Gaynelle Arabian, MD;  Location: [facility];  Service: Orthopedics;  Laterality: Right;  . heart ablation  1992   [place]"Wolfe-Parkinson white syndrome"  . LUMBAR DISC SURGERY  2018   [place] - decompression, foraminectomy.   Marland Kitchen MELANOMA EXCISION Right 06/2009   right ankle  . TONSILLECTOMY    . WISDOM TOOTH EXTRACTION     Family History  Problem Relation Age of Onset  . Lung cancer Father 16  . Hypertension Mother   . Hyperlipidemia Mother   . Congestive Heart Failure Mother   . Osteoporosis Sister 15  . Hodgkin's lymphoma Paternal Grandfather   . Hypertension Maternal Grandmother   . Hyperlipidemia Maternal Grandmother   . Congestive Heart Failure Paternal Grandmother   . Tremor Daughter   . Colon cancer Neg Hx   . Esophageal cancer Neg Hx   . Liver cancer Neg Hx   . Pancreatic cancer Neg Hx   . Rectal cancer Neg Hx   . Stomach cancer Neg Hx    Social History   Socioeconomic History  . Marital status: Married    Spouse name: Not on file  . Number of children: 2  . Years of education: college  . Highest education level: Not on file  Occupational History  . Occupation: Retired    Comment: Hydrographic surveyor business Gaffer)   Tobacco Use  . Smoking status: Never Smoker  . Smokeless tobacco: Never Used  Vaping Use  . Vaping Use: Never used  Substance and Sexual Activity  . Alcohol use: Yes    Alcohol/week: 5.0 - 6.0 standard drinks    Types: 5 - 6 Standard drinks or equivalent per week    Comment: wine  . Drug use: No  . Sexual activity: Yes    Partners: Male    Birth control/protection: Post-menopausal  Other Topics Concern  . Not on file  Social History Narrative   Drinks about 1/2 cup of coffee a day    Enjoys gardening    4 grandchildren    Previously lived in [place] before retiring    Social Determinants of Radio broadcast assistant Strain: Low Risk   . Difficulty of Paying  Living Expenses: Not hard at all  Food Insecurity: No Food Insecurity  . Worried About Charity fundraiser in the Last Year: Never true  . Ran Out of Food in the Last Year: Never true  Transportation Needs: No Transportation Needs  . Lack of Transportation (Medical): No  . Lack of Transportation (Non-Medical): No  Physical Activity: Sufficiently Active  . Days of Exercise per Week: 5 days  . Minutes of Exercise per Session: 90 min  Stress: No Stress Concern Present  . Feeling of Stress : Not at all  Social Connections: Socially Integrated  . Frequency of Communication with Friends and Family: More than three times a week  . Frequency of Social Gatherings with Friends and Family: More  than three times a week  . Attends Religious Services: 1 to 4 times per year  . Active Member of Clubs or Organizations: Yes  . Attends Archivist Meetings: 1 to 4 times per year  . Marital Status: Married    Tobacco Counseling Counseling given: Not Answered   Clinical Intake:  Pre-visit preparation completed: Yes  Pain : No/denies pain     BMI - recorded: 18.4 Nutritional Status: BMI <19  Underweight Nutritional Risks: None Diabetes: No  How often do you need to have someone help you when you read instructions, pamphlets, or other written materials from your doctor or pharmacy?: 1 - Never  Diabetic?No  Interpreter Needed?: No  Information entered by :: Charlott Rakes, LPN   Activities of Daily Living In your present state of health, do you have any difficulty performing the following activities: 03/06/2021  Hearing? N  Vision? N  Difficulty concentrating or making decisions? N  Walking or climbing stairs? N  Dressing or bathing? N  Doing errands, shopping? N  Preparing Food and eating ? N  Using the Toilet? N  In the past six months, have you accidently leaked urine? N  Do you have problems with loss of bowel control? N  Managing your Medications? N  Managing your  Finances? N  Housekeeping or managing your Housekeeping? N  Some recent data might be hidden    Patient Care Team: Inda Coke, [place] as PCP - General (Physician Assistant) [employer], Rex Kras, LCSW as Social Worker (Licensed Clinical Social Worker) Danis, Kirke Corin, MD as Consulting Physician (Gastroenterology) Harriett Sine, MD as Consulting Physician (Dermatology) Debbra Riding, MD as Consulting Physician (Ophthalmology) Yisroel Ramming, Everardo All, MD as Consulting Physician (Obstetrics and Gynecology) Gaynelle Arabian, MD as Consulting Physician (Orthopedic Surgery) Gerda Diss, DO as Consulting Physician (Sports Medicine)  Indicate any recent Medical Services you may have received from other than [facility] providers in the past year (date may be approximate).     Assessment:   This is a routine wellness examination for [place].  Hearing/Vision screen  Hearing Screening   125Hz  250Hz  500Hz  1000Hz  2000Hz  3000Hz  4000Hz  6000Hz  8000Hz   Right ear:           Left ear:           Comments: Pt denies any hearing issues   Vision Screening Comments: Pt follows up with Dr Wyatt Portela for annual eye exams   Dietary issues and exercise activities discussed: Current Exercise Habits: Home exercise routine;Structured exercise class, Type of exercise: walking;Other - see comments (works with  a trainer 3 days a week for 45 min), Time (Minutes): > 60, Frequency (Times/Week): 5, Weekly Exercise (Minutes/Week): 0  Goals Addressed            This Visit's Progress   . Patient Stated       Continue working out with trainer and exercise       Depression Screen PHQ 2/9 Scores 03/06/2021 02/06/2020 01/26/2019 09/09/2017 07/13/2017  PHQ - 2 Score 0 0 1 3 4   PHQ- 9 Score - - - 6 9    Fall Risk Fall Risk  03/06/2021 02/06/2020 01/26/2019 11/07/2018 08/31/2018  Falls in the past year? 0 0 0 0 0  Comment - - - - Emmi Telephone Survey: data to providers prior to load  Number falls in past  yr: 0 0 - - -  Injury with Fall? 0 0 - 0 -  Risk for fall  due to : Impaired vision - - - -  Follow up Falls prevention discussed Falls evaluation completed;Education provided;Falls prevention discussed - - -    FALL RISK PREVENTION PERTAINING TO THE HOME:  Any stairs in or around the home? Yes  If so, are there any without handrails? Yes  Home free of loose throw rugs in walkways, pet beds, electrical cords, etc? Yes  Adequate lighting in your home to reduce risk of falls? Yes   ASSISTIVE DEVICES UTILIZED TO PREVENT FALLS:  Life alert? No  Use of a cane, walker or w/c? No  Grab bars in the bathroom? No  Shower chair or bench in shower? No  Elevated toilet seat or a handicapped toilet? No   TIMED UP AND GO:  Was the test performed? No     Cognitive Function:     6CIT Screen 02/06/2020  What Year? 0 points  What month? 0 points  What time? 0 points  Count back from 20 0 points  Months in reverse 0 points  Repeat phrase 0 points  Total Score 0    Immunizations Immunization History  Administered Date(s) Administered  . Tdap 06/08/2017  . Zoster 08/09/2014    TDAP status: Up to date  Flu Vaccine status: Declined, Education has been provided regarding the importance of this vaccine but patient still declined. Advised may receive this vaccine at local pharmacy or Health Dept. Aware to provide a copy of the vaccination record if obtained from local pharmacy or Health Dept. Verbalized acceptance and understanding.  Pneumococcal vaccine status: Declined,  Education has been provided regarding the importance of this vaccine but patient still declined. Advised may receive this vaccine at local pharmacy or Health Dept. Aware to provide a copy of the vaccination record if obtained from local pharmacy or Health Dept. Verbalized acceptance and understanding.   Covid-19 vaccine status: Declined, Education has been provided regarding the importance of this vaccine but patient still  declined. Advised may receive this vaccine at local pharmacy or Health Dept.or vaccine clinic. Aware to provide a copy of the vaccination record if obtained from local pharmacy or Health Dept. Verbalized acceptance and understanding.  Qualifies for Shingles Vaccine? Yes   Zostavax completed Yes   Shingrix Completed?: No.    Education has been provided regarding the importance of this vaccine. Patient has been advised to call insurance company to determine out of pocket expense if they have not yet received this vaccine. Advised may also receive vaccine at local pharmacy or Health Dept. Verbalized acceptance and understanding.  Screening Tests Health Maintenance  Topic Date Due  . COVID-19 Vaccine (1) 01/14/2022 (Originally 06/23/1958)  . PNA vac Low Risk Adult (1 of 2 - PCV13) 01/14/2022 (Originally 06/23/2013)  . MAMMOGRAM  01/06/2022  . TETANUS/TDAP  06/09/2027  . COLONOSCOPY (Pts 45-65yrs Insurance coverage will need to be confirmed)  04/21/2028  . DEXA SCAN  Completed  . Hepatitis C Screening  Completed  . HPV VACCINES  Aged Out  . INFLUENZA VACCINE  Discontinued    Health Maintenance  There are no preventive care reminders to display for this patient.  Colorectal cancer screening: Type of screening: Colonoscopy. Completed 04/21/18. Repeat every 10 years  Mammogram status: Completed 12/17/20. Repeat every year  Bone Density status: Completed 01/21/21. Results reflect: Bone density results: OSTEOPENIA. Repeat every 0 years.  Additional Screening:  Hepatitis C Screening:  Completed 05/25/16  Vision Screening: Recommended annual ophthalmology exams for early detection of glaucoma and other disorders of the  eye. Is the patient up to date with their annual eye exam?  Yes  Who is the provider or what is the name of the office in which the patient attends annual eye exams? Dr Wyatt Portela  If pt is not established with a provider, would they like to be referred to a provider to establish  care? No .   Dental Screening: Recommended annual dental exams for proper oral hygiene  Community Resource Referral / Chronic Care Management: CRR required this visit?  No   CCM required this visit?  No      Plan:     I have personally reviewed and noted the following in the patient's chart:   . Medical and social history . Use of alcohol, tobacco or illicit drugs  . Current medications and supplements including opioid prescriptions.  . Functional ability and status . Nutritional status . Physical activity . Advanced directives . List of other physicians . Hospitalizations, surgeries, and ER visits in previous 12 months . Vitals . Screenings to include cognitive, depression, and falls . Referrals and appointments  In addition, I have reviewed and discussed with patient certain preventive protocols, quality metrics, and best practice recommendations. A written personalized care plan for preventive services as well as general preventive health recommendations were provided to patient.     Willette Brace, LPN   [id number]/39/[id number]   Nurse Notes: None

## 2021-03-18 ENCOUNTER — Other Ambulatory Visit: Payer: Self-pay

## 2021-03-18 ENCOUNTER — Encounter: Payer: Self-pay | Admitting: Obstetrics and Gynecology

## 2021-03-18 ENCOUNTER — Ambulatory Visit (INDEPENDENT_AMBULATORY_CARE_PROVIDER_SITE_OTHER): Payer: Medicare Other | Admitting: Obstetrics and Gynecology

## 2021-03-18 VITALS — BP 110/70 | HR 88 | Resp 14 | Ht 63.5 in | Wt 103.0 lb

## 2021-03-18 DIAGNOSIS — R350 Frequency of micturition: Secondary | ICD-10-CM | POA: Diagnosis not present

## 2021-03-18 DIAGNOSIS — N952 Postmenopausal atrophic vaginitis: Secondary | ICD-10-CM

## 2021-03-18 DIAGNOSIS — N941 Unspecified dyspareunia: Secondary | ICD-10-CM

## 2021-03-18 MED ORDER — ESTRADIOL 10 MCG VA TABS: ORAL_TABLET | VAGINAL | 4 refills | Status: DC

## 2021-03-18 NOTE — Progress Notes (Signed)
75 y.o. G29P0002 Married White or Caucasian Not Hispanic or Latino female here for new patient visit for vaginal dryness and post coital urinary symptoms.   She uses KY jelly for lubrication with intercourse, still uncomfortable. Intermittently after intercourse she will develop UTI symptoms. The symptoms typically occur a couple of days after IC, she gets frequency and urgency to void. No dysuria. Symptoms last a couple of hours. Cultures have been negative.   Last mammogram in 3/22 negative.     Patient's last menstrual period was 10/12/1997 (lmp unknown).          Sexually active: Yes.    The current method of family planning is post menopausal status.    Exercising: Yes.    trainer 3x/week, walking Smoker:  no  Health Maintenance: Pap: 05-25-16 negative  History of abnormal Pap:  no MMG:  01-06-21 density B/BIRADS 1 negative  BMD:   01-21-21 osteopenia  Colonoscopy: 04-21-18 diverticulosis, f/u 10 years  TDaP:  06-08-17 Gardasil: no   reports that she has never smoked. She has never used smokeless tobacco. She reports current alcohol use of about 5.0 - 6.0 standard drinks of alcohol per week. She reports that she does not use drugs.  Past Medical History:  Diagnosis Date  . Dry eyes    uses Restasis eye drops.  . Hip pain    "bursitis right hip"  . Melanoma ([place]) 2011   R ankle; sees dermatology regularly  . Osteopenia    was on fosamax for awhile  . Syncope and collapse 10/01/2016  . Tremor   . Wolff-Parkinson-White (WPW) syndrome 1992   heart ablation    Past Surgical History:  Procedure Laterality Date  . BREAST BIOPSY  1975   benign lump  . BREAST CYST ASPIRATION     benign  . BUNIONECTOMY Left   . COLONOSCOPY    . DILATION AND CURETTAGE OF UTERUS  1977   with BTL  . EXCISION/RELEASE BURSA HIP Right 02/17/2016   Procedure: RIGHT HIP BURSECTOMY GLUTEAL TENDON REPAIR;  Surgeon: Gaynelle Arabian, MD;  Location: [facility];  Service: Orthopedics;  Laterality: Right;  . heart  ablation  1992   [place]"Wolfe-Parkinson white syndrome"  . LUMBAR DISC SURGERY  2018   [place] - decompression, foraminectomy.   Marland Kitchen MELANOMA EXCISION Right 06/2009   right ankle  . TONSILLECTOMY    . WISDOM TOOTH EXTRACTION      Current Outpatient Medications  Medication Sig Dispense Refill  . Ascorbic Acid Buffered (BUFFERED VITAMIN C) 1000 MG CAPS Take 1,000 mg by mouth daily.    . B Complex Vitamins (B COMPLEX PO) Take 1 tablet by mouth daily.    . calcium carbonate (OS-CAL) 600 MG tablet Take 600 mg by mouth daily.    . celecoxib (CELEBREX) 100 MG capsule Take 100 mg by mouth 2 (two) times daily as needed.    . Cholecalciferol (VITAMIN D) 2000 UNITS CAPS Take 2,000 Units by mouth daily.     . Coenzyme Q10 (COQ10) 100 MG CAPS Take 1 capsule by mouth daily.     . cycloSPORINE (RESTASIS) 0.05 % ophthalmic emulsion Place 1 drop into both eyes 2 (two) times daily.    . fish oil-omega-3 fatty acids 1000 MG capsule Take 1 g by mouth 2 (two) times daily.     . Multiple Vitamins-Minerals (ZINC PO) Take 20 mg by mouth daily in the afternoon.    . Probiotic Product (PROBIOTIC DAILY PO) Take 1 capsule by mouth daily.  No current facility-administered medications for this visit.    Family History  Problem Relation Age of Onset  . Lung cancer Father 65  . Hypertension Mother   . Hyperlipidemia Mother   . Congestive Heart Failure Mother   . Osteoporosis Sister 75  . Hodgkin's lymphoma Paternal Grandfather   . Hypertension Maternal Grandmother   . Hyperlipidemia Maternal Grandmother   . Congestive Heart Failure Paternal Grandmother   . Tremor Daughter   . Colon cancer Neg Hx   . Esophageal cancer Neg Hx   . Liver cancer Neg Hx   . Pancreatic cancer Neg Hx   . Rectal cancer Neg Hx   . Stomach cancer Neg Hx     Review of Systems  Genitourinary:       Vaginal dryness uti symptoms after intercourse-- no urinary symptoms today  All other systems reviewed and are  negative.   Exam:   BP 110/70 (BP Location: Right Arm, Patient Position: Sitting, Cuff Size: Normal)   Pulse 88   Resp 14   Ht 5' 3.5" (1.613 m)   Wt 103 lb (46.7 kg)   LMP 10/12/1997 (LMP Unknown)   BMI 17.96 kg/m   Weight change: @WEIGHTCHANGE @ Height:   Height: 5' 3.5" (161.3 cm)  Ht Readings from Last 3 Encounters:  03/18/21 5' 3.5" (1.613 m)  01/14/21 5' 3.5" (1.613 m)  11/21/20 5' 3.5" (1.613 m)    General appearance: alert, cooperative and appears stated age   Pelvic: External genitalia:  no lesions              Urethra:  normal appearing urethra with no masses, tenderness or lesions              Bartholins and Skenes: normal                 Vagina: mildly atrophic appearing vagina with normal color and discharge, no lesions              Cervix: no lesions               Bimanual Exam:  Uterus:  normal size, contour, position, consistency, mobility, non-tender              Adnexa: no mass, fullness, tenderness               Thurnell Garbe chaperoned for the exam.  1. Vaginal atrophy Use lubrication with intercourse Discussed use of vaginal estrogen, options reviewed. No contraindications Mammogram UTD - Estradiol 10 MCG TABS vaginal tablet; Place one tablet vaginal qhs x 1 week, then space out to 2 x a week.  Dispense: 24 tablet; Refill: 4  2. Dyspareunia, female Will start vaginal estrogen as above Also discussed different options for lubrication  3. Urinary frequency Occurs intermittently after intercourse. Negative urine culture  CC: Len Blalock, PA

## 2021-03-18 NOTE — Patient Instructions (Signed)
You can try uberlube or astroglide (silicon) for lubrication   Atrophic Vaginitis  Atrophic vaginitis is a condition in which the tissues that line the vagina become dry and thin. This condition is most common in women who have stopped having regular menstrual periods (are in menopause). This usually starts when a woman is 106 to 73 years old. That is the time when a woman's estrogen levels begin to decrease. Estrogen is a female hormone. It helps to keep the tissues of the vagina moist. It stimulates the vagina to produce a clear fluid that lubricates the vagina for sex. This fluid also protects the vagina from infection. Lack of estrogen can cause the lining of the vagina to get thinner and dryer. The vagina may also shrink in size. It may become less elastic. Atrophic vaginitis tends to get worse over time as a woman's estrogen level drops. What are the causes? This condition is caused by the normal drop in estrogen that happens around the time of menopause. What increases the risk? Certain conditions or situations may lower a woman's estrogen level, leading to a higher risk for atrophic vaginitis. You are more likely to develop this condition if:  You are taking medicines that block estrogen.  You have had your ovaries removed.  You are being treated for cancer with radiation or medicines (chemotherapy).  You have given birth or are breastfeeding.  You are older than age 70.  You smoke. What are the signs or symptoms? Symptoms of this condition include:  Pain, soreness, a feeling of pressure, or bleeding during sex (dyspareunia).  Vaginal burning, irritation, or itching.  Pain or bleeding when a speculum is used in a vaginal exam.  Having burning pain while urinating.  Vaginal discharge. In some cases, there are no symptoms. How is this diagnosed? This condition is diagnosed based on your medical history and a physical exam. This will include a pelvic exam that checks the  vaginal tissues. Though rare, you may also have other tests, including:  A urine test.  A test that checks the acid balance in your vagina (acid balance test). How is this treated? Treatment for this condition depends on how severe your symptoms are. Treatment may include:  Using an over-the-counter vaginal lubricant before sex.  Using a long-acting vaginal moisturizer.  Using low-dose estrogen for moderate to severe symptoms that do not respond to other treatments. Options include creams, tablets, and inserts (vaginal rings). Before you use a vaginal estrogen, tell your health care provider if you have a history of: ? Breast cancer. ? Endometrial cancer. ? Blood clots. If you are not sexually active and your symptoms are very mild, you may not need treatment. Follow these instructions at home: Medicines  Take over-the-counter and prescription medicines only as told by your health care provider.  Do not use herbal or alternative medicines unless your health care provider says that you can.  Use over-the-counter creams, lubricants, or moisturizers for dryness only as told by your health care provider. General instructions  If your atrophic vaginitis is caused by menopause, discuss all of your menopause symptoms and treatment options with your health care provider.  Do not douche.  Do not use products that can make your vagina dry. These include: ? Scented feminine sprays. ? Scented tampons. ? Scented soaps.  Vaginal sex can help to improve blood flow and elasticity of vaginal tissue. If you choose to have sex and it hurts, try using a water-soluble lubricant or moisturizer right before having  sex. Contact a health care provider if:  Your discharge looks different than normal.  Your vagina has an unusual smell.  You have new symptoms.  Your symptoms do not improve with treatment.  Your symptoms get worse. Summary  Atrophic vaginitis is a condition in which the tissues  that line the vagina become dry and thin. It is most common in women who have stopped having regular menstrual periods (are in menopause).  Treatment options include using vaginal lubricants and low-dose vaginal estrogen.  Contact a health care provider if your vagina has an unusual smell, or if your symptoms get worse or do not improve after treatment. This information is not intended to replace advice given to you by your health care provider. Make sure you discuss any questions you have with your health care provider. Document Revised: 03/28/2020 Document Reviewed: 03/28/2020 Elsevier Patient Education  [place].

## 2021-08-18 ENCOUNTER — Ambulatory Visit: Payer: Medicare Other | Admitting: Obstetrics and Gynecology

## 2021-11-20 DIAGNOSIS — H16223 Keratoconjunctivitis sicca, not specified as Sjogren's, bilateral: Secondary | ICD-10-CM | POA: Diagnosis not present

## 2021-11-20 DIAGNOSIS — H43392 Other vitreous opacities, left eye: Secondary | ICD-10-CM | POA: Diagnosis not present

## 2021-11-20 DIAGNOSIS — H2513 Age-related nuclear cataract, bilateral: Secondary | ICD-10-CM | POA: Diagnosis not present

## 2021-11-24 ENCOUNTER — Other Ambulatory Visit: Payer: Self-pay | Admitting: Physician Assistant

## 2021-11-24 DIAGNOSIS — Z1231 Encounter for screening mammogram for malignant neoplasm of breast: Secondary | ICD-10-CM

## 2022-01-08 ENCOUNTER — Ambulatory Visit
Admission: RE | Admit: 2022-01-08 | Discharge: 2022-01-08 | Disposition: A | Discharge status: Home / Self Care | Payer: Medicare Other | Source: Ambulatory Visit | Attending: Physician Assistant | Admitting: Physician Assistant

## 2022-01-08 DIAGNOSIS — Z1231 Encounter for screening mammogram for malignant neoplasm of breast: Secondary | ICD-10-CM | POA: Diagnosis not present

## 2022-03-12 ENCOUNTER — Ambulatory Visit (INDEPENDENT_AMBULATORY_CARE_PROVIDER_SITE_OTHER): Payer: Medicare Other

## 2022-03-12 DIAGNOSIS — Z0000 Encounter for general adult medical examination without abnormal findings: Secondary | ICD-10-CM | POA: Diagnosis not present

## 2022-03-12 NOTE — Patient Instructions (Signed)
Megan Taylor , Thank you for taking time to come for your Medicare Wellness Visit. I appreciate your ongoing commitment to your health goals. Please review the following plan we discussed and let me know if I can assist you in the future.   Screening recommendations/referrals: Colonoscopy: Done 04/21/18 repeat every 10 years  Mammogram: Done 01/08/22 repeat every year  Bone Density: Done 01/21/21 repeat every 2 years  Recommended yearly ophthalmology/optometry visit for glaucoma screening and checkup Recommended yearly dental visit for hygiene and checkup  Vaccinations: Influenza vaccine: Declined and discussed  Pneumococcal vaccine: declined  Tdap vaccine: Done 06/08/17 repeat every 10 years  Shingles vaccine: declined and discussed    Covid-19:declined and  discussed   Advanced directives: Please bring a copy of your health care power of attorney and living will to the office at your convenience.  Conditions/risks identified: Continue to work with trainer and exercise   Next appointment: Follow up in one year for your annual wellness visit    Preventive Care 74 Years and Older, Female Preventive care refers to lifestyle choices and visits with your health care provider that can promote health and wellness. What does preventive care include? A yearly physical exam. This is also called an annual well check. Dental exams once or twice a year. Routine eye exams. Ask your health care provider how often you should have your eyes checked. Personal lifestyle choices, including: Daily care of your teeth and gums. Regular physical activity. Eating a healthy diet. Avoiding tobacco and drug use. Limiting alcohol use. Practicing safe sex. Taking low-dose aspirin every day. Taking vitamin and mineral supplements as recommended by your health care provider. What happens during an annual well check? The services and screenings done by your health care provider during your annual well check will  depend on your age, overall health, lifestyle risk factors, and family history of disease. Counseling  Your health care provider may ask you questions about your: Alcohol use. Tobacco use. Drug use. Emotional well-being. Home and relationship well-being. Sexual activity. Eating habits. History of falls. Memory and ability to understand (cognition). Work and work Statistician. Reproductive health. Screening  You may have the following tests or measurements: Height, weight, and BMI. Blood pressure. Lipid and cholesterol levels. These may be checked every 5 years, or more frequently if you are over 74 years old. Skin check. Lung cancer screening. You may have this screening every year starting at age 31 if you have a 30-pack-year history of smoking and currently smoke or have quit within the past 15 years. Fecal occult blood test (FOBT) of the stool. You may have this test every year starting at age 70. Flexible sigmoidoscopy or colonoscopy. You may have a sigmoidoscopy every 5 years or a colonoscopy every 10 years starting at age 39. Hepatitis C blood test. Hepatitis B blood test. Sexually transmitted disease (STD) testing. Diabetes screening. This is done by checking your blood sugar (glucose) after you have not eaten for a while (fasting). You may have this done every 1-3 years. Bone density scan. This is done to screen for osteoporosis. You may have this done starting at age 39. Mammogram. This may be done every 1-2 years. Talk to your health care provider about how often you should have regular mammograms. Talk with your health care provider about your test results, treatment options, and if necessary, the need for more tests. Vaccines  Your health care provider may recommend certain vaccines, such as: Influenza vaccine. This is recommended every year. Tetanus,  diphtheria, and acellular pertussis (Tdap, Td) vaccine. You may need a Td booster every 10 years. Zoster vaccine. You may  need this after age 71. Pneumococcal 13-valent conjugate (PCV13) vaccine. One dose is recommended after age 14. Pneumococcal polysaccharide (PPSV23) vaccine. One dose is recommended after age 42. Talk to your health care provider about which screenings and vaccines you need and how often you need them. This information is not intended to replace advice given to you by your health care provider. Make sure you discuss any questions you have with your health care provider. Document Released: 10/25/2015 Document Revised: 06/17/2016 Document Reviewed: 07/30/2015 Elsevier Interactive Patient Education  2017 [place] Prevention in the Home Falls can cause injuries. They can happen to people of all ages. There are many things you can do to make your home safe and to help prevent falls. What can I do on the outside of my home? Regularly fix the edges of walkways and driveways and fix any cracks. Remove anything that might make you trip as you walk through a door, such as a raised step or threshold. Trim any bushes or trees on the path to your home. Use bright outdoor lighting. Clear any walking paths of anything that might make someone trip, such as rocks or tools. Regularly check to see if handrails are loose or broken. Make sure that both sides of any steps have handrails. Any raised decks and porches should have guardrails on the edges. Have any leaves, snow, or ice cleared regularly. Use sand or salt on walking paths during winter. Clean up any spills in your garage right away. This includes oil or grease spills. What can I do in the bathroom? Use night lights. Install grab bars by the toilet and in the tub and shower. Do not use towel bars as grab bars. Use non-skid mats or decals in the tub or shower. If you need to sit down in the shower, use a plastic, non-slip stool. Keep the floor dry. Clean up any water that spills on the floor as soon as it happens. Remove soap buildup in  the tub or shower regularly. Attach bath mats securely with double-sided non-slip rug tape. Do not have throw rugs and other things on the floor that can make you trip. What can I do in the bedroom? Use night lights. Make sure that you have a light by your bed that is easy to reach. Do not use any sheets or blankets that are too big for your bed. They should not hang down onto the floor. Have a firm chair that has side arms. You can use this for support while you get dressed. Do not have throw rugs and other things on the floor that can make you trip. What can I do in the kitchen? Clean up any spills right away. Avoid walking on wet floors. Keep items that you use a lot in easy-to-reach places. If you need to reach something above you, use a strong step stool that has a grab bar. Keep electrical cords out of the way. Do not use floor polish or wax that makes floors slippery. If you must use wax, use non-skid floor wax. Do not have throw rugs and other things on the floor that can make you trip. What can I do with my stairs? Do not leave any items on the stairs. Make sure that there are handrails on both sides of the stairs and use them. Fix handrails that are broken or loose. Make  sure that handrails are as long as the stairways. Check any carpeting to make sure that it is firmly attached to the stairs. Fix any carpet that is loose or worn. Avoid having throw rugs at the top or bottom of the stairs. If you do have throw rugs, attach them to the floor with carpet tape. Make sure that you have a light switch at the top of the stairs and the bottom of the stairs. If you do not have them, ask someone to add them for you. What else can I do to help prevent falls? Wear shoes that: Do not have high heels. Have rubber bottoms. Are comfortable and fit you well. Are closed at the toe. Do not wear sandals. If you use a stepladder: Make sure that it is fully opened. Do not climb a closed  stepladder. Make sure that both sides of the stepladder are locked into place. Ask someone to hold it for you, if possible. Clearly mark and make sure that you can see: Any grab bars or handrails. First and last steps. Where the edge of each step is. Use tools that help you move around (mobility aids) if they are needed. These include: Canes. Walkers. Scooters. Crutches. Turn on the lights when you go into a dark area. Replace any light bulbs as soon as they burn out. Set up your furniture so you have a clear path. Avoid moving your furniture around. If any of your floors are uneven, fix them. If there are any pets around you, be aware of where they are. Review your medicines with your doctor. Some medicines can make you feel dizzy. This can increase your chance of falling. Ask your doctor what other things that you can do to help prevent falls. This information is not intended to replace advice given to you by your health care provider. Make sure you discuss any questions you have with your health care provider. Document Released: 07/25/2009 Document Revised: 03/05/2016 Document Reviewed: 11/02/2014 Elsevier Interactive Patient Education  2017 [employer].

## 2022-03-12 NOTE — Progress Notes (Signed)
Virtual Visit via Telephone Note  I connected with  Megan Taylor on 03/12/22 at 11:15 AM EDT by telephone and verified that I am speaking with the correct person using two identifiers.  Medicare Annual Wellness visit completed telephonically due to Covid-19 pandemic.   Persons participating in this call: This Health Coach and this patient.   Location: Patient: Home Provider: Office    I discussed the limitations, risks, security and privacy concerns of performing an evaluation and management service by telephone and the availability of in person appointments. The patient expressed understanding and agreed to proceed.  Unable to perform video visit due to video visit attempted and failed and/or patient does not have video capability.   Some vital signs may be absent or patient reported.   Willette Brace, LPN   Subjective:   Megan Taylor is a 76 y.o. female who presents for Medicare Annual (Subsequent) preventive examination.  Review of Systems     Cardiac Risk Factors include: advanced age (>39mn, >>65women)     Objective:    There were no vitals filed for this visit. There is no height or weight on file to calculate BMI.     03/12/2022   11:28 AM 03/06/2021   11:00 AM 02/06/2020   11:20 AM 11/06/2019   12:02 PM 01/26/2019    1:12 PM 09/09/2017   10:40 AM 01/28/2017    8:08 AM  Advanced Directives  Does Patient Have a Medical Advance Directive? Yes Yes Yes Yes Yes Yes Yes  Type of [place] will Healthcare Power of Attorney Living will;Healthcare Power of [place]  Living will;Healthcare Power of [place]  Does patient want to make changes to medical advance directive?   No - Patient declined No - Patient declined  No - Patient declined   Copy of [place] Chart? No - copy requested No - copy requested No - copy requested No -  copy requested, Physician notified  No - copy requested     Current Medications (verified) Outpatient Encounter Medications as of 03/12/2022  Medication Sig   Ascorbic Acid Buffered (BUFFERED VITAMIN C) 1000 MG CAPS Take 1,000 mg by mouth daily.   B Complex Vitamins (B COMPLEX PO) Take 1 tablet by mouth daily.   calcium carbonate (OS-CAL) 600 MG tablet Take 600 mg by mouth daily.   celecoxib (CELEBREX) 100 MG capsule Take 100 mg by mouth 2 (two) times daily as needed.   Cholecalciferol (VITAMIN D) 2000 UNITS CAPS Take 2,000 Units by mouth daily.    Coenzyme Q10 (COQ10) 100 MG CAPS Take 1 capsule by mouth daily.    cycloSPORINE (RESTASIS) 0.05 % ophthalmic emulsion Place 1 drop into both eyes 2 (two) times daily.   fish oil-omega-3 fatty acids 1000 MG capsule Take 1 g by mouth 2 (two) times daily.    Multiple Vitamins-Minerals (ZINC PO) Take 20 mg by mouth daily in the afternoon.   Probiotic Product (PROBIOTIC DAILY PO) Take 1 capsule by mouth daily.    [DISCONTINUED] Estradiol 10 MCG TABS vaginal tablet Place one tablet vaginal qhs x 1 week, then space out to 2 x a week.   No facility-administered encounter medications on file as of 03/12/2022.    Allergies (verified) St. john's wort [st john's wort], Aleve [naproxen sodium], and Augmentin [amoxicillin-pot clavulanate]   History: Past Medical History:  Diagnosis Date   Dry eyes  uses Restasis eye drops.   Hip pain    "bursitis right hip"   Melanoma ([place]) 2011   R ankle; sees dermatology regularly   Osteopenia    was on fosamax for awhile   Syncope and collapse 10/01/2016   Tremor    Wolff-Parkinson-White (WPW) syndrome 1992   heart ablation   Past Surgical History:  Procedure Laterality Date   BREAST BIOPSY Left    BREAST CYST ASPIRATION     benign   BREAST EXCISIONAL BIOPSY Right 1975   benign lump   BUNIONECTOMY Left    COLONOSCOPY     DILATION AND CURETTAGE OF UTERUS  1977   with BTL   EXCISION/RELEASE BURSA HIP  Right 02/17/2016   Procedure: RIGHT HIP BURSECTOMY GLUTEAL TENDON REPAIR;  Surgeon: Gaynelle Arabian, MD;  Location: [facility];  Service: Orthopedics;  Laterality: Right;   heart ablation  1992   [place]"Wolfe-Parkinson white syndrome"   [place]  2018   [place] - decompression, foraminectomy.    MELANOMA EXCISION Right 06/2009   right ankle   TONSILLECTOMY     WISDOM TOOTH EXTRACTION     Family History  Problem Relation Age of Onset   Lung cancer Father 40   Hypertension Mother    Hyperlipidemia Mother    Congestive Heart Failure Mother    Osteoporosis Sister 11   Hodgkin's lymphoma Paternal Grandfather    Hypertension Maternal Grandmother    Hyperlipidemia Maternal Grandmother    Congestive Heart Failure Paternal Grandmother    Tremor Daughter    Colon cancer Neg Hx    Esophageal cancer Neg Hx    Liver cancer Neg Hx    Pancreatic cancer Neg Hx    Rectal cancer Neg Hx    Stomach cancer Neg Hx    Social History   Socioeconomic History   Marital status: Married    Spouse name: Not on file   Number of children: 2   Years of education: college   Highest education level: Not on file  Occupational History   Occupation: Retired    Comment: Hydrographic surveyor business Gaffer)   Tobacco Use   Smoking status: Never   Smokeless tobacco: Never  Vaping Use   Vaping Use: Never used  Substance and Sexual Activity   Alcohol use: Yes    Alcohol/week: 5.0 - 6.0 standard drinks    Types: 5 - 6 Standard drinks or equivalent per week    Comment: wine   Drug use: No   Sexual activity: Yes    Partners: Male    Birth control/protection: Post-menopausal  Other Topics Concern   Not on file  Social History Narrative   Drinks about 1/2 cup of coffee a day    Enjoys gardening    4 grandchildren    Previously lived in [place] before retiring    Social Determinants of Radio broadcast assistant Strain: Low Risk    Difficulty of Paying Living Expenses: Not hard at all  Food  Insecurity: No Food Insecurity   Worried About Charity fundraiser in the Last Year: Never true   Arboriculturist in the Last Year: Never true  Transportation Needs: No Transportation Needs   Lack of Transportation (Medical): No   Lack of Transportation (Non-Medical): No  Physical Activity: Sufficiently Active   Days of Exercise per Week: 5 days   Minutes of Exercise per Session: 90 min  Stress: No Stress Concern Present   Feeling of Stress : Not  at all  Social Connections: Socially Integrated   Frequency of Communication with Friends and Family: More than three times a week   Frequency of Social Gatherings with Friends and Family: More than three times a week   Attends Religious Services: 1 to 4 times per year   Active Member of [employer] or Organizations: Yes   Attends Archivist Meetings: 1 to 4 times per year   Marital Status: Married    Tobacco Counseling Counseling given: Not Answered   Clinical Intake:  Pre-visit preparation completed: Yes  Pain : No/denies pain     BMI - recorded: 17.96 Nutritional Status: BMI <19  Underweight Nutritional Risks: None Diabetes: No  How often do you need to have someone help you when you read instructions, pamphlets, or other written materials from your doctor or pharmacy?: 1 - Never  Diabetic?no  Interpreter Needed?: No  Information entered by :: Charlott Rakes, LPN   Activities of Daily Living    03/12/2022   11:29 AM  In your present state of health, do you have any difficulty performing the following activities:  Hearing? 0  Vision? 0  Difficulty concentrating or making decisions? 0  Walking or climbing stairs? 0  Dressing or bathing? 0  Doing errands, shopping? 0  Preparing Food and eating ? N  Using the Toilet? N  In the past six months, have you accidently leaked urine? N  Do you have problems with loss of bowel control? N  Managing your Medications? N  Managing your Finances? N  Housekeeping or managing  your Housekeeping? N    Patient Care Team: Inda Coke, [place] as PCP - General (Physician Assistant) [employer], Rex Kras, LCSW (Inactive) as Social Worker (Licensed Clinical Social Worker) Danis, Kirke Corin, MD as Consulting Physician (Gastroenterology) Harriett Sine, MD as Consulting Physician (Dermatology) Katy Fitch, Darlina Guys, MD as Consulting Physician (Ophthalmology) Yisroel Ramming, Everardo All, MD as Consulting Physician (Obstetrics and Gynecology) Gaynelle Arabian, MD as Consulting Physician (Orthopedic Surgery) Gerda Diss, DO as Consulting Physician (Sports Medicine)  Indicate any recent Medical Services you may have received from other than [facility] providers in the past year (date may be approximate).     Assessment:   This is a routine wellness examination for [place].  Hearing/Vision screen Hearing Screening - Comments:: Pt denies any hearing issues  Vision Screening - Comments:: Pt follows up with Dr Wyatt Portela for annual eye exams   Dietary issues and exercise activities discussed: Current Exercise Habits: Home exercise routine;Structured exercise class, Type of exercise: walking;strength training/weights;stretching, Time (Minutes): > 60, Frequency (Times/Week): 5, Weekly Exercise (Minutes/Week): 0   Goals Addressed             This Visit's Progress    Patient Stated       Continue to work out with trainer and exercise        Depression Screen    03/12/2022   11:27 AM 03/06/2021   10:59 AM 02/06/2020   11:21 AM 01/26/2019    1:13 PM 09/09/2017   10:46 AM 07/13/2017    9:40 AM  PHQ 2/9 Scores  PHQ - 2 Score 0 0 0 '1 3 4  '$ PHQ- 9 Score     6 9    Fall Risk    03/12/2022   11:29 AM 03/06/2021   11:01 AM 02/06/2020   11:21 AM 01/26/2019    1:13 PM 11/07/2018    1:59 PM  Fall Risk   Falls in  the past year? 0 0 0 0 0  Number falls in past yr: 0 0 0    Injury with Fall? 0 0 0  0  Risk for fall due to : Impaired vision Impaired vision     Follow up Falls  prevention discussed Falls prevention discussed Falls evaluation completed;Education provided;Falls prevention discussed      FALL RISK PREVENTION PERTAINING TO THE HOME:  Any stairs in or around the home? Yes  If so, are there any without handrails? Yes Home free of loose throw rugs in walkways, pet beds, electrical cords, etc? Yes  Adequate lighting in your home to reduce risk of falls? Yes   ASSISTIVE DEVICES UTILIZED TO PREVENT FALLS:  Life alert? No  Use of a cane, walker or w/c? No  Grab bars in the bathroom? No  Shower chair or bench in shower? No  Elevated toilet seat or a handicapped toilet? No   TIMED UP AND GO:  Was the test performed? No .  Cognitive Function:        03/12/2022   11:31 AM 02/06/2020   11:21 AM  6CIT Screen  What Year? 0 points 0 points  What month? 0 points 0 points  What time? 0 points 0 points  Count back from 20 0 points 0 points  Months in reverse 0 points 0 points  Repeat phrase 0 points 0 points  Total Score 0 points 0 points    Immunizations Immunization History  Administered Date(s) Administered   Tdap 06/08/2017   Zoster, Live 08/09/2014    TDAP status: Up to date  Flu Vaccine status: Declined, Education has been provided regarding the importance of this vaccine but patient still declined. Advised may receive this vaccine at local pharmacy or Health Dept. Aware to provide a copy of the vaccination record if obtained from local pharmacy or Health Dept. Verbalized acceptance and understanding.  Pneumococcal vaccine status: Declined,  Education has been provided regarding the importance of this vaccine but patient still declined. Advised may receive this vaccine at local pharmacy or Health Dept. Aware to provide a copy of the vaccination record if obtained from local pharmacy or Health Dept. Verbalized acceptance and understanding.   Covid-19 vaccine status: Declined, Education has been provided regarding the importance of this  vaccine but patient still declined. Advised may receive this vaccine at local pharmacy or Health Dept.or vaccine clinic. Aware to provide a copy of the vaccination record if obtained from local pharmacy or Health Dept. Verbalized acceptance and understanding.  Qualifies for Shingles Vaccine? No   Zostavax completed No   Shingrix Completed?: No.    Education has been provided regarding the importance of this vaccine. Patient has been advised to call insurance company to determine out of pocket expense if they have not yet received this vaccine. Advised may also receive vaccine at local pharmacy or Health Dept. Verbalized acceptance and understanding.  Screening Tests Health Maintenance  Topic Date Due   Zoster Vaccines- Shingrix (1 of 2) 06/12/2022 (Originally 06/24/1965)   Pneumonia Vaccine 75+ Years old (1 - PCV) 03/13/2023 (Originally 06/23/1952)   MAMMOGRAM  01/09/2023   TETANUS/TDAP  06/09/2027   COLONOSCOPY (Pts 45-91yr Insurance coverage will need to be confirmed)  04/21/2028   DEXA SCAN  Completed   Hepatitis C Screening  Completed   HPV VACCINES  Aged Out   INFLUENZA VACCINE  Discontinued   COVID-19 Vaccine  Discontinued    Health Maintenance  There are no preventive care reminders to  display for this patient.   Colorectal cancer screening: Type of screening: Colonoscopy. Completed 04/21/18. Repeat every 10 years  Mammogram status: Completed 01/08/22. Repeat every year  Bone Density status: Completed 01/21/21. Results reflect: Bone density results: OSTEOPENIA. Repeat every 2 years.  Additional Screening:  Hepatitis C Screening:  Completed 05/28/16  Vision Screening: Recommended annual ophthalmology exams for early detection of glaucoma and other disorders of the eye. Is the patient up to date with their annual eye exam?  Yes  Who is the provider or what is the name of the office in which the patient attends annual eye exams? Dr Wyatt Portela  If pt is not established with a  provider, would they like to be referred to a provider to establish care? No .   Dental Screening: Recommended annual dental exams for proper oral hygiene  Community Resource Referral / Chronic Care Management: CRR required this visit?  No   CCM required this visit?  No      Plan:     I have personally reviewed and noted the following in the patient's chart:   Medical and social history Use of alcohol, tobacco or illicit drugs  Current medications and supplements including opioid prescriptions.  Functional ability and status Nutritional status Physical activity Advanced directives List of other physicians Hospitalizations, surgeries, and ER visits in previous 12 months Vitals Screenings to include cognitive, depression, and falls Referrals and appointments  In addition, I have reviewed and discussed with patient certain preventive protocols, quality metrics, and best practice recommendations. A written personalized care plan for preventive services as well as general preventive health recommendations were provided to patient.     Willette Brace, LPN   10/16/4006   Nurse Notes: None

## 2022-04-30 ENCOUNTER — Ambulatory Visit (INDEPENDENT_AMBULATORY_CARE_PROVIDER_SITE_OTHER): Payer: Medicare Other | Admitting: Family

## 2022-04-30 ENCOUNTER — Encounter: Payer: Self-pay | Admitting: Family

## 2022-04-30 VITALS — BP 119/76 | HR 90 | Temp 98.4°F | Ht 63.0 in | Wt 102.5 lb

## 2022-04-30 DIAGNOSIS — L237 Allergic contact dermatitis due to plants, except food: Secondary | ICD-10-CM | POA: Diagnosis not present

## 2022-04-30 MED ORDER — METHYLPREDNISOLONE ACETATE 80 MG/ML IJ SUSP
60.0000 mg | Freq: Once | INTRAMUSCULAR | Status: AC
  Administered 2022-04-30: 60 mg via INTRAMUSCULAR

## 2022-04-30 MED ORDER — HYDROXYZINE HCL 10 MG PO TABS: 10.0000 mg | ORAL_TABLET | Freq: Three times a day (TID) | ORAL | 0 refills | Status: DC | PRN

## 2022-04-30 MED ORDER — TRIAMCINOLONE ACETONIDE 0.1 % EX CREA: 1.0000 | TOPICAL_CREAM | Freq: Two times a day (BID) | CUTANEOUS | 0 refills | Status: DC

## 2022-04-30 NOTE — Patient Instructions (Addendum)
It was very nice to see you today!    We gave you a steroid injection today to help with your rash and itching. I also sent in Hydroxyzine to help with your itching. You can take 2 pills at bedtime to help with sleep and itching. I also sent a steroid cream to use on the rash to decrease inflammation and itching.       PLEASE NOTE:  If you had any lab tests please let us know if you have not heard back within a few days. You may see your results on MyChart before we have a chance to review them but we will give you a call once they are reviewed by [place]. If we ordered any referrals today, please let us know if you have not heard from their office within the next week.

## 2022-04-30 NOTE — Progress Notes (Signed)
Patient ID: Megan Taylor, female    DOB: 10-Aug-1946, 76 y.o.   MRN: [id number]  Chief Complaint  Patient presents with   Poison Ivy    Pt c/o poison Ivy on her eyelids and arms, Has tried calamine lotion which did help with the itch and redness.  Pt states she is always outside in the woods Present since 7/16.     HPI: Poison Ivy dermatitis:  exposed 4 days ago on bilateral arms, and last night started above her left eyelid and side of face. She is concerned it continues to spread. Very itchy, has had many years ago.    Assessment & Plan:  1. Poison ivy dermatitis pt given steroid injection, Hydroxyzine for itching and steroid cream, pt advised on use & SE.  - methylPREDNISolone acetate (DEPO-MEDROL) injection 60 mg - hydrOXYzine (ATARAX) 10 MG tablet; Take 1 tablet (10 mg total) by mouth 3 (three) times daily as needed for itching.  Dispense: 30 tablet; Refill: 0 - triamcinolone cream (KENALOG) 0.1 %; Apply 1 Application topically 2 (two) times daily.  Dispense: 30 g; Refill: 0    Subjective:    Outpatient Medications Prior to Visit  Medication Sig Dispense Refill   Ascorbic Acid Buffered (BUFFERED VITAMIN C) 1000 MG CAPS Take 1,000 mg by mouth daily.     B Complex Vitamins (B COMPLEX PO) Take 1 tablet by mouth daily.     calcium carbonate (OS-CAL) 600 MG tablet Take 600 mg by mouth daily.     celecoxib (CELEBREX) 100 MG capsule Take 100 mg by mouth 2 (two) times daily as needed.     Cholecalciferol (VITAMIN D) 2000 UNITS CAPS Take 2,000 Units by mouth daily.      Coenzyme Q10 (COQ10) 100 MG CAPS Take 1 capsule by mouth daily.      cycloSPORINE (RESTASIS) 0.05 % ophthalmic emulsion Place 1 drop into both eyes 2 (two) times daily.     fish oil-omega-3 fatty acids 1000 MG capsule Take 1 g by mouth 2 (two) times daily.      Multiple Vitamins-Minerals (ZINC PO) Take 20 mg by mouth daily in the afternoon.     Probiotic Product (PROBIOTIC DAILY PO) Take 1 capsule by mouth daily.       No facility-administered medications prior to visit.   Past Medical History:  Diagnosis Date   Dry eyes    uses Restasis eye drops.   Hip pain    "bursitis right hip"   Melanoma ([place]) 2011   R ankle; sees dermatology regularly   Osteopenia    was on fosamax for awhile   Syncope and collapse 10/01/2016   Tremor    Wolff-Parkinson-White (WPW) syndrome 1992   heart ablation   Past Surgical History:  Procedure Laterality Date   BREAST BIOPSY Left    BREAST CYST ASPIRATION     benign   BREAST EXCISIONAL BIOPSY Right 1975   benign lump   BUNIONECTOMY Left    COLONOSCOPY     DILATION AND CURETTAGE OF UTERUS  1977   with BTL   EXCISION/RELEASE BURSA HIP Right 02/17/2016   Procedure: RIGHT HIP BURSECTOMY GLUTEAL TENDON REPAIR;  Surgeon: Gaynelle Arabian, MD;  Location: [facility];  Service: Orthopedics;  Laterality: Right;   heart ablation  1992   [place]"Wolfe-Parkinson white syndrome"   [place]  2018   [place] - decompression, foraminectomy.    MELANOMA EXCISION Right 06/2009   right ankle   TONSILLECTOMY  WISDOM TOOTH EXTRACTION     Allergies  Allergen Reactions   St. John's Wort [St John's Wort]     Pt does not remember reaction   Aleve [Naproxen Sodium] Other (See Comments)    Hot flashes, fever   Augmentin [Amoxicillin-Pot Clavulanate] Other (See Comments)    Severe diarrhea after one dose      Objective:    Physical Exam Vitals and nursing note reviewed.  Constitutional:      Appearance: Normal appearance.  Cardiovascular:     Rate and Rhythm: Normal rate and regular rhythm.  Pulmonary:     Effort: Pulmonary effort is normal.     Breath sounds: Normal breath sounds.  Musculoskeletal:        General: Normal range of motion.  Skin:    General: Skin is warm and dry.     Findings: Rash (diffuse, small erythematous patches on arms and left eyelid, temple area) present.  Neurological:     Mental Status: She is alert.  Psychiatric:        Mood  and Affect: Mood normal.        Behavior: Behavior normal.    BP 119/76 (BP Location: Left Arm, Patient Position: Sitting, Cuff Size: Large)   Pulse 90   Temp 98.4 F (36.9 C) (Temporal)   Ht '5\' 3"'$  (1.6 m)   Wt 102 lb 8 oz (46.5 kg)   LMP 10/12/1997 (LMP Unknown)   SpO2 97%   BMI 18.16 kg/m  Wt Readings from Last 3 Encounters:  04/30/22 102 lb 8 oz (46.5 kg)  03/18/21 103 lb (46.7 kg)  01/14/21 105 lb 8 oz (47.9 kg)       Megan Sewer, NP

## 2022-12-01 ENCOUNTER — Other Ambulatory Visit: Payer: Self-pay | Admitting: Physician Assistant

## 2022-12-01 DIAGNOSIS — Z1231 Encounter for screening mammogram for malignant neoplasm of breast: Secondary | ICD-10-CM

## 2023-01-12 ENCOUNTER — Ambulatory Visit
Admission: RE | Admit: 2023-01-12 | Discharge: 2023-01-12 | Disposition: A | Discharge status: Home / Self Care | Payer: Medicare Other | Source: Ambulatory Visit | Attending: Physician Assistant | Admitting: Physician Assistant

## 2023-01-12 DIAGNOSIS — Z1231 Encounter for screening mammogram for malignant neoplasm of breast: Secondary | ICD-10-CM

## 2023-01-28 ENCOUNTER — Ambulatory Visit: Payer: Medicare Other | Admitting: Physician Assistant

## 2023-02-02 ENCOUNTER — Ambulatory Visit (INDEPENDENT_AMBULATORY_CARE_PROVIDER_SITE_OTHER): Payer: Medicare Other | Admitting: Physician Assistant

## 2023-02-02 ENCOUNTER — Encounter: Payer: Self-pay | Admitting: Physician Assistant

## 2023-02-02 VITALS — BP 138/77 | HR 79 | Ht 63.0 in | Wt 102.2 lb

## 2023-02-02 DIAGNOSIS — F4321 Adjustment disorder with depressed mood: Secondary | ICD-10-CM

## 2023-02-02 DIAGNOSIS — G4701 Insomnia due to medical condition: Secondary | ICD-10-CM | POA: Diagnosis not present

## 2023-02-02 NOTE — Progress Notes (Unsigned)
[facility] MD/PA/NP Initial Note  02/02/2023 8:57 AM Megan Taylor   MRN:  [id number]  Chief Complaint:  Chief Complaint   Establish Care    HPI:  States she gets annoyed easily. Not as patient as she once was.  Feels down at times, comes and goes, can tell she needs to be around people more.  Patient is able to enjoy things when she has the opportunity.  Energy and motivation are good.  She does all of her housework and cooking.  Retired.  No extreme sadness, tearfulness, or feelings of hopelessness.  Sleep is disturbed, d/t pain in hip/back, chronic issues.  Personal hygiene is normal.  Appetite has not changed.  Weight is stable.  Denies suicidal or homicidal thoughts.  Likes to read, lately she can't focus, will forget things, can't concentrate, forgets what she went in a room for, things like that. Her Mom had dementia so that scares her.  No problems with finances or forgetting important family functions for example.  Patient denies increased energy with decreased need for sleep, increased talkativeness, racing thoughts, impulsivity or risky behaviors, increased spending, increased libido, grandiosity, increased irritability or anger, paranoia, or hallucinations.  Visit Diagnosis:    ICD-10-CM   1. Situational depression  F43.21     2. Insomnia due to medical condition  G47.01       Past Psychiatric History:   Past medications for mental health diagnoses include: Ashwaganda, melatonin  Marriage counseling in the past  Past Medical History:  Past Medical History:  Diagnosis Date   Dry eyes    uses Restasis eye drops.   Hip pain    "bursitis right hip"   Melanoma 2011   R ankle; sees dermatology regularly   Osteopenia    was on fosamax for awhile   Syncope and collapse 10/01/2016   Tremor    Wolff-Parkinson-White (WPW) syndrome 1992   heart ablation    Past Surgical History:  Procedure Laterality Date   BREAST BIOPSY Left    BREAST CYST ASPIRATION     benign    BREAST EXCISIONAL BIOPSY Right 1975   benign lump   BUNIONECTOMY Left    COLONOSCOPY     DILATION AND CURETTAGE OF UTERUS  1977   with BTL   EXCISION/RELEASE BURSA HIP Right 02/17/2016   Procedure: RIGHT HIP BURSECTOMY GLUTEAL TENDON REPAIR;  Surgeon: Ollen Gross, MD;  Location: [facility];  Service: Orthopedics;  Laterality: Right;   heart ablation  1992   [place]"Wolfe-Parkinson white syndrome"   LUMBAR DISC SURGERY  2018   [place] - decompression, foraminectomy.    MELANOMA EXCISION Right 06/2009   right ankle   TONSILLECTOMY     WISDOM TOOTH EXTRACTION      Family Psychiatric History:  See below  Family History:  Family History  Problem Relation Age of Onset   Hypertension Mother    Hyperlipidemia Mother    Congestive Heart Failure Mother    Lung cancer Father 69   Osteoporosis Sister 51   Hypertension Maternal Grandmother    Hyperlipidemia Maternal Grandmother    Hodgkin's lymphoma Paternal Grandfather    Congestive Heart Failure Paternal Grandmother    Tremor Daughter    Colon cancer Neg Hx    Esophageal cancer Neg Hx    Liver cancer Neg Hx    Pancreatic cancer Neg Hx    Rectal cancer Neg Hx    Stomach cancer Neg Hx     Social History:  Social History  Socioeconomic History   Marital status: Married    Spouse name: Not on file   Number of children: 2   Years of education: college   Highest education level: Not on file  Occupational History   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 She and husband moved around a lot. She worked with him in [employer].          Enjoys gardening    4 grandchildren    Previously lived in [place] before retiring       Drinks about 1/2 cup of coffee a day   Christian   No legal   Social Determinants of Health   Financial Resource Strain: Low Risk  (02/02/2023)   Overall Financial Resource Strain (CARDIA)    Difficulty of Paying Living Expenses: Not hard at all  Food Insecurity: No Food Insecurity (02/02/2023)   Hunger Vital Sign    Worried About Running Out of Food in the Last Year: Never true    Ran Out of Food in the Last Year: Never true  Transportation Needs: No Transportation Needs (02/02/2023)   PRAPARE - Administrator, Civil Service (Medical): No    Lack of Transportation (Non-Medical): No  Physical Activity: Sufficiently Active (02/02/2023)   Exercise Vital Sign    Days of Exercise per Week: 7 days    Minutes of Exercise per Session: 50 min  Stress: No Stress Concern Present (03/12/2022)   [employer] of Occupational Health - Occupational Stress Questionnaire    Feeling of Stress : Not at all  Social Connections: Socially Integrated (02/02/2023)   Social Connection and Isolation Panel [NHANES]    Frequency of Communication with Friends and Family: More than three times a week    Frequency of Social Gatherings with Friends and Family: Three times a week    Attends Religious Services: More than 4 times per year    Active Member of Clubs or Organizations: Yes    Attends Banker Meetings: More than 4 times per year    Marital Status: Married    Allergies:  Allergies  Allergen Reactions   St. John's Wort [St John's Wort]     Pt does not remember reaction   Aleve [Naproxen Sodium] Other (See Comments)    Hot flashes, fever   Augmentin [Amoxicillin-Pot Clavulanate] Other (See Comments)    Severe diarrhea after one dose    Metabolic Disorder Labs: No results  found for: "HGBA1C", "MPG" No results found for: "PROLACTIN" Lab Results  Component Value Date   CHOL 243 (H) 01/14/2021   TRIG 80.0 01/14/2021   HDL 108.50 01/14/2021   CHOLHDL 2 01/14/2021   VLDL 16.0 01/14/2021   LDLCALC 119 (H) 01/14/2021   LDLCALC 129 (H) 10/20/2017   Lab Results  Component Value Date   TSH 2.03 04/11/2018   TSH 3.09 07/13/2017  Therapeutic Level Labs: No results found for: "LITHIUM" No results found for: "VALPROATE" No results found for: "CBMZ"  Current Medications: Current Outpatient Medications  Medication Sig Dispense Refill   Acetylcysteine (NAC 600) 600 MG CAPS Take 1 capsule (600 mg total) by mouth 2 (two) times daily. 60 capsule 11   Ascorbic Acid Buffered (BUFFERED VITAMIN C) 1000 MG CAPS Take 1,000 mg by mouth daily.     B Complex Vitamins (B COMPLEX PO) Take 1 tablet by mouth daily.     calcium carbonate (OS-CAL) 600 MG tablet Take 600 mg by mouth daily.     Cholecalciferol (VITAMIN D) 2000 UNITS CAPS Take 2,000 Units by mouth daily.      Coenzyme Q10 (COQ10) 100 MG CAPS Take 1 capsule by mouth daily.      cycloSPORINE (RESTASIS) 0.05 % ophthalmic emulsion Place 1 drop into both eyes 2 (two) times daily.     fish oil-omega-3 fatty acids 1000 MG capsule Take 1 g by mouth 2 (two) times daily.      Ginkgo Biloba 200 MG CAPS Take 1 capsule (200 mg total) by mouth daily. 30 capsule 11   Multiple Vitamins-Minerals (ZINC PO) Take 20 mg by mouth daily in the afternoon.     Probiotic Product (PROBIOTIC DAILY PO) Take 1 capsule by mouth daily.      UNABLE TO FIND Ashwaganda     celecoxib (CELEBREX) 100 MG capsule Take 100 mg by mouth 2 (two) times daily as needed. (Patient not taking: Reported on 02/02/2023)     hydrOXYzine (ATARAX) 10 MG tablet Take 1 tablet (10 mg total) by mouth 3 (three) times daily as needed for itching. (Patient not taking: Reported on 02/02/2023) 30 tablet 0   triamcinolone cream (KENALOG) 0.1 % Apply 1 Application topically 2  (two) times daily. (Patient not taking: Reported on 02/02/2023) 30 g 0   No current facility-administered medications for this visit.    Medication Side Effects: none  Orders placed this visit:  No orders of the defined types were placed in this encounter.   Psychiatric Specialty Exam:  Review of Systems  Constitutional: Negative.   HENT: Negative.    Eyes: Negative.   Respiratory: Negative.    Cardiovascular: Negative.   Gastrointestinal: Negative.   Endocrine: Negative.   Genitourinary: Negative.   Musculoskeletal:        Chronic back pain and stiffness  Skin: Negative.   Allergic/Immunologic: Positive for environmental allergies.  Neurological: Negative.   Hematological: Negative.   Psychiatric/Behavioral:         See HPI    Blood pressure 138/77, pulse 79, height  (1.6 m), weight 102 lb 3.2 oz (46.4 kg), last menstrual period 10/12/1997.Body mass index is 18.1 kg/m.  General Appearance: Casual and Well Groomed  Eye Contact:  Good  Speech:  Clear and Coherent and Normal Rate  Volume:  Normal  Mood:  Euthymic  Affect:  Congruent  Thought Process:  Goal Directed and Descriptions of Associations: Circumstantial  Orientation:  Full (Time, Place, and Person)  Thought Content: Logical   Suicidal Thoughts:  No  Homicidal Thoughts:  No  Memory:   see HPI  Judgement:  Good  Insight:  Good  Psychomotor Activity:  Normal  Concentration:  Concentration: Fair  Recall:  Good  Fund of Knowledge: Good  Language: Good  Assets:  Desire for Improvement Financial Resources/Insurance Housing Transportation  ADL's:  Intact  Cognition: WNL  Prognosis:  Good   Screenings:  PHQ2-9  Flowsheet Row Office Visit from 02/02/2023 in [facility] Clinical Support from 03/12/2022 in [facility] Clinical Support from 03/06/2021 in [facility] Clinical Support from 02/06/2020 in [facility] Clinical Support from 01/26/2019 in [facility]  PHQ-2 Total Score 1 0 0 0 1      Receiving Psychotherapy: No   has seen Bray[facility] C[facility] in the past for marriage counseling  Treatment Plan/Recommendations:  PDMP reviewed.  No controlled substances. I provided 65 minutes of face to face time during this encounter, including time spent before and after the visit in records review, medical decision making, counseling pertinent to today's visit, and charting.   We discussed different options for the mild depression, likely age related minimal forgetfulness and some anxiety she is experiencing.  She does not really want to take any medication but is willing to try other supplements. Recommend Gingko Biloba, NAC 600 mg bid.  Benefits, risk and side effects were discussed and she accepts. I also discussed healthy diet which she already practices for the most part, sleep hygiene discussed, and she is already exercising, has a Systems analyst.  Also discussed possible volunteering to have more socialization.  She will look into that.  Start NAC 600 mg, 1 p.o. twice daily. Start ginkgo biloba 200 mg daily. Continue vitamin C, B complex, calcium, vitamin D, CoQ 10, fish oil, multivitamin, ashwaganda, and probiotic. Recommend she get back in with [facility]. Return in 6 weeks.  Melony Overly, PA-C

## 2023-02-03 ENCOUNTER — Encounter: Payer: Self-pay | Admitting: Physician Assistant

## 2023-02-03 MED ORDER — GINKGO BILOBA 200 MG PO CAPS: 200.0000 mg | ORAL_CAPSULE | Freq: Every day | ORAL | 11 refills | Status: AC

## 2023-02-03 MED ORDER — NAC 600 600 MG PO CAPS: 600.0000 mg | ORAL_CAPSULE | Freq: Two times a day (BID) | ORAL | 11 refills | Status: AC

## 2023-02-10 DIAGNOSIS — H43392 Other vitreous opacities, left eye: Secondary | ICD-10-CM | POA: Diagnosis not present

## 2023-02-10 DIAGNOSIS — H2513 Age-related nuclear cataract, bilateral: Secondary | ICD-10-CM | POA: Diagnosis not present

## 2023-03-16 ENCOUNTER — Ambulatory Visit: Payer: Medicare Other | Admitting: Physician Assistant

## 2023-03-23 ENCOUNTER — Ambulatory Visit (INDEPENDENT_AMBULATORY_CARE_PROVIDER_SITE_OTHER): Payer: Medicare Other

## 2023-03-23 VITALS — Wt 102.0 lb

## 2023-03-23 DIAGNOSIS — Z0000 Encounter for general adult medical examination without abnormal findings: Secondary | ICD-10-CM | POA: Diagnosis not present

## 2023-03-23 NOTE — Progress Notes (Signed)
I connected with  Jennelle Human on 03/23/23 by a audio enabled telemedicine application and verified that I am speaking with the correct person using two identifiers.  Patient Location: Home  Provider Location: Office/Clinic  I discussed the limitations of evaluation and management by telemedicine. The patient expressed understanding and agreed to proceed.    Patient Medicare AWV questionnaire was completed by the patient on 03/21/23; I have confirmed that all information answered by patient is correct and no changes since this date.     Subjective:   Megan Taylor is a 77 y.o. female who presents for Medicare Annual (Subsequent) preventive examination.  Review of Systems     Cardiac Risk Factors include: advanced age (>65men, >83 women)     Objective:    Today's Vitals   03/23/23 1057  Weight: 102 lb (46.3 kg)   Body mass index is 18.07 kg/m.     03/23/2023   11:02 AM 03/12/2022   11:28 AM 03/06/2021   11:00 AM 02/06/2020   11:20 AM 11/06/2019   12:02 PM 01/26/2019    1:12 PM 09/09/2017   10:40 AM  Advanced Directives  Does Patient Have a Medical Advance Directive? Yes Yes Yes Yes Yes Yes Yes  Type of Estate agent of [place];Living will Healthcare Power of [place];Living will Healthcare Power of Attorney Living will;Healthcare Power of [employer] Power of [place];Living will  Living will;Healthcare Power of Attorney  Does patient want to make changes to medical advance directive?    No - Patient declined No - Patient declined  No - Patient declined  Copy of Healthcare Power of Attorney in Chart? No - copy requested No - copy requested No - copy requested No - copy requested No - copy requested, Physician notified  No - copy requested    Current Medications (verified) Outpatient Encounter Medications as of 03/23/2023  Medication Sig   Acetylcysteine (NAC 600) 600 MG CAPS Take 1 capsule (600 mg total) by mouth 2 (two) times daily.   Ascorbic  Acid Buffered (BUFFERED VITAMIN C) 1000 MG CAPS Take 1,000 mg by mouth daily.   B Complex Vitamins (B COMPLEX PO) Take 1 tablet by mouth daily.   calcium carbonate (OS-CAL) 600 MG tablet Take 600 mg by mouth daily.   Cholecalciferol (VITAMIN D) 2000 UNITS CAPS Take 2,000 Units by mouth daily.    Coenzyme Q10 (COQ10) 100 MG CAPS Take 1 capsule by mouth daily.    cycloSPORINE (RESTASIS) 0.05 % ophthalmic emulsion Place 1 drop into both eyes 2 (two) times daily.   fish oil-omega-3 fatty acids 1000 MG capsule Take 1 g by mouth 2 (two) times daily.    Ginkgo Biloba 200 MG CAPS Take 1 capsule (200 mg total) by mouth daily.   Multiple Vitamins-Minerals (ZINC PO) Take 20 mg by mouth daily in the afternoon.   Probiotic Product (PROBIOTIC DAILY PO) Take 1 capsule by mouth daily.    UNABLE TO FIND Ashwaganda   celecoxib (CELEBREX) 100 MG capsule Take 100 mg by mouth 2 (two) times daily as needed. (Patient not taking: Reported on 03/23/2023)   [DISCONTINUED] hydrOXYzine (ATARAX) 10 MG tablet Take 1 tablet (10 mg total) by mouth 3 (three) times daily as needed for itching. (Patient not taking: Reported on 02/02/2023)   [DISCONTINUED] triamcinolone cream (KENALOG) 0.1 % Apply 1 Application topically 2 (two) times daily. (Patient not taking: Reported on 02/02/2023)   No facility-administered encounter medications on file as of 03/23/2023.    Allergies (  verified) St. john's wort [st john's wort], Aleve [naproxen sodium], and Augmentin [amoxicillin-pot clavulanate]   History: Past Medical History:  Diagnosis Date   Dry eyes    uses Restasis eye drops.   Hip pain    "bursitis right hip"   Melanoma (HCC) 2011   R ankle; sees dermatology regularly   Osteopenia    was on fosamax for awhile   Syncope and collapse 10/01/2016   Tremor    Wolff-Parkinson-White (WPW) syndrome 1992   heart ablation   Past Surgical History:  Procedure Laterality Date   BREAST BIOPSY Left    BREAST CYST ASPIRATION     benign    BREAST EXCISIONAL BIOPSY Right 1975   benign lump   BUNIONECTOMY Left    COLONOSCOPY     DILATION AND CURETTAGE OF UTERUS  1977   with BTL   EXCISION/RELEASE BURSA HIP Right 02/17/2016   Procedure: RIGHT HIP BURSECTOMY GLUTEAL TENDON REPAIR;  Surgeon: Ollen Gross, MD;  Location: [facility];  Service: Orthopedics;  Laterality: Right;   heart ablation  1992   [place]"Wolfe-Parkinson white syndrome"   LUMBAR DISC SURGERY  2018   [place] - decompression, foraminectomy.    MELANOMA EXCISION Right 06/2009   right ankle   TONSILLECTOMY     WISDOM TOOTH EXTRACTION     Family History  Problem Relation Age of Onset   Hypertension Mother    Hyperlipidemia Mother    Congestive Heart Failure Mother    Lung cancer Father 32   Osteoporosis Sister 63   Hypertension Maternal Grandmother    Hyperlipidemia Maternal Grandmother    Hodgkin's lymphoma Paternal Grandfather    Congestive Heart Failure Paternal Grandmother    Tremor Daughter    Colon cancer Neg Hx    Esophageal cancer Neg Hx    Liver cancer Neg Hx    Pancreatic cancer Neg Hx    Rectal cancer Neg Hx    Stomach cancer Neg Hx    Social History   Socioeconomic History   Marital status: Married    Spouse name: Not on file   Number of children: 2   Years of education: college   Highest education level: Not on file  Occupational History   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 She and husband moved around a lot. She worked with him in [employer].          Enjoys gardening    4 grandchildren    Previously lived in [place] before retiring       Drinks about 1/2 cup of coffee a day   Christian   No legal   Social Determinants of Health   Financial Resource Strain: Low Risk  (03/21/2023)   Overall Financial Resource Strain (CARDIA)    Difficulty of Paying Living Expenses: Not hard at all  Food Insecurity: No Food Insecurity (03/21/2023)   Hunger  Vital Sign    Worried About Programme researcher, broadcasting/film/video in the Last Year: Never true    Ran Out of Food in the Last Year: Never true  Transportation Needs: No Transportation Needs (03/21/2023)   PRAPARE - Administrator, Civil Service (Medical): No    Lack of Transportation (Non-Medical): No  Physical Activity: Sufficiently Active (03/21/2023)   Exercise Vital Sign    Days of Exercise per Week: 6 days    Minutes of Exercise per Session: 50 min  Stress: No Stress Concern Present (03/21/2023)   [employer] of Occupational Health - Occupational Stress Questionnaire    Feeling of Stress : Only a little  Social Connections: Socially Integrated (03/21/2023)   Social Connection and Isolation Panel [NHANES]    Frequency of Communication with Friends and Family: More than three times a week    Frequency of Social Gatherings with Friends and Family: Once a week    Attends Religious Services: More than 4 times per year    Active Member of [employer] or Organizations: Yes    Attends Engineer, structural: More than 4 times per year    Marital Status: Married    Tobacco Counseling Counseling given: Not Answered   Clinical Intake:  Pre-visit preparation completed: Yes  Pain : No/denies pain     BMI - recorded: 18.07 Nutritional Status: BMI <19  Underweight Nutritional Risks: None Diabetes: No  How often do you need to have someone help you when you read instructions, pamphlets, or other written materials from your doctor or pharmacy?: 1 -  Never  Diabetic?no  Interpreter Needed?: No  Information entered by :: Lanier Ensign, LPN   Activities of Daily Living    03/21/2023    7:38 PM  In your present state of health, do you have any difficulty performing the following activities:  Hearing? 0  Vision? 0  Difficulty concentrating or making decisions? 0  Walking or climbing stairs? 0  Dressing or bathing? 0  Doing errands, shopping? 0  Preparing Food and eating ? N  Using the Toilet? N  In the past six months, have you accidently leaked urine? N  Do you have problems with loss of bowel control? N  Managing your Medications? N  Managing your Finances? N  Housekeeping or managing your Housekeeping? N    Patient Care Team: Jarold Motto, [place] as PCP - General (Physician Assistant) Hazle Nordmann, Minerva Fester, LCSW (Inactive) as Social Worker (Licensed Clinical Social Worker) Danis, Andreas Blower, MD as Consulting Physician (Gastroenterology) Aris Lot, MD as Consulting Physician (Dermatology) Dione Booze, Bertram Millard, MD as Consulting Physician (Ophthalmology) Ardell Isaacs, Forrestine Him, MD as Consulting Physician (Obstetrics and Gynecology) Ollen Gross, MD as Consulting Physician (Orthopedic Surgery) Andrena Mews, DO as Consulting Physician (Sports Medicine)  Indicate any recent Medical Services you may have received from other than [facility] providers in the past year (date may be approximate).     Assessment:   This is a routine wellness examination for [place].  Hearing/Vision screen Hearing Screening - Comments:: Pt denies any hearing issues  Vision Screening - Comments:: Pt follows up with Dr Dione Booze for annual eye exams   Dietary issues and exercise activities discussed: Current Exercise Habits: Home exercise routine, Type of exercise: walking;stretching;strength training/weights, Time (Minutes): 50, Frequency (Times/Week): 6, Weekly Exercise (Minutes/Week): 300   Goals Addressed             This  Visit's Progress    Patient Stated  Stay healthy        Depression Screen    03/23/2023   11:01 AM 02/02/2023    3:35 PM 03/12/2022   11:27 AM 03/06/2021   10:59 AM 02/06/2020   11:21 AM 01/26/2019    1:13 PM 09/09/2017   10:46 AM  PHQ 2/9 Scores  PHQ - 2 Score 0  0 0 0 1 3  PHQ- 9 Score       6     Information is confidential and restricted. Go to Review Flowsheets to unlock data.    Fall Risk    03/21/2023    7:38 PM 03/12/2022   11:29 AM 03/06/2021   11:01 AM 02/06/2020   11:21 AM 01/26/2019    1:13 PM  Fall Risk   Falls in the past year?  0 0 0 0  Number falls in past yr: 0 0 0 0   Injury with Fall?  0 0 0   Risk for fall due to : Impaired vision Impaired vision Impaired vision    Follow up Falls prevention discussed Falls prevention discussed Falls prevention discussed Falls evaluation completed;Education provided;Falls prevention discussed     FALL RISK PREVENTION PERTAINING TO THE HOME:  Any stairs in or around the home? Yes  If so, are there any without handrails? Yes  Home free of loose throw rugs in walkways, pet beds, electrical cords, etc? Yes  Adequate lighting in your home to reduce risk of falls? Yes   ASSISTIVE DEVICES UTILIZED TO PREVENT FALLS:  Life alert? No  Use of a cane, walker or w/c? No  Grab bars in the bathroom? No  Shower chair or bench in shower? Yes  Elevated toilet seat or a handicapped toilet? No   TIMED UP AND GO:  Was the test performed? No .  Cognitive Function:        03/23/2023   11:04 AM 03/12/2022   11:31 AM 02/06/2020   11:21 AM  6CIT Screen  What Year? 0 points 0 points 0 points  What month? 0 points 0 points 0 points  What time? 0 points 0 points 0 points  Count back from 20 0 points 0 points 0 points  Months in reverse 0 points 0 points 0 points  Repeat phrase 0 points 0 points 0 points  Total Score 0 points 0 points 0 points    Immunizations Immunization History  Administered Date(s) Administered   Tdap  06/08/2017   Zoster, Live 08/09/2014    TDAP status: Up to date  Flu Vaccine status: Declined, Education has been provided regarding the importance of this vaccine but patient still declined. Advised may receive this vaccine at local pharmacy or Health Dept. Aware to provide a copy of the vaccination record if obtained from local pharmacy or Health Dept. Verbalized acceptance and understanding.  Pneumococcal vaccine status: Declined,  Education has been provided regarding the importance of this vaccine but patient still declined. Advised may receive this vaccine at local pharmacy or Health Dept. Aware to provide a copy of the vaccination record if obtained from local pharmacy or Health Dept. Verbalized acceptance and understanding.   Covid-19 vaccine status: Declined, Education has been provided regarding the importance of this vaccine but patient still declined. Advised may receive this vaccine at local pharmacy or Health Dept.or vaccine clinic. Aware to provide a copy of the vaccination record if obtained from local pharmacy or Health Dept. Verbalized acceptance and understanding.  Qualifies for Shingles Vaccine? Yes   Zostavax completed  No   Shingrix Completed?: No.    Education has been provided regarding the importance of this vaccine. Patient has been advised to call insurance company to determine out of pocket expense if they have not yet received this vaccine. Advised may also receive vaccine at local pharmacy or Health Dept. Verbalized acceptance and understanding.  Screening Tests Health Maintenance  Topic Date Due   Zoster Vaccines- Shingrix (1 of 2) Never done   Pneumonia Vaccine 94+ Years old (1 of 1 - PCV) 03/22/2024 (Originally 06/23/2013)   MAMMOGRAM  01/12/2024   Medicare Annual Wellness (AWV)  03/22/2024   DTaP/Tdap/Td (2 - Td or Tdap) 06/09/2027   Colonoscopy  04/21/2028   DEXA SCAN  Completed   Hepatitis C Screening  Completed   HPV VACCINES  Aged Out   INFLUENZA  VACCINE  Discontinued   COVID-19 Vaccine  Discontinued    Health Maintenance  Health Maintenance Due  Topic Date Due   Zoster Vaccines- Shingrix (1 of 2) Never done    Colorectal cancer screening: Type of screening: Colonoscopy. Completed 04/21/18. Repeat every 10 years  Mammogram status: Completed 01/12/23. Repeat every year  Bone Density status: Completed 01/21/21. Results reflect: Bone density results: OSTEOPENIA. Repeat every 2 years.   Additional Screening:  Hepatitis C Screening: Completed 05/25/16  Vision Screening: Recommended annual ophthalmology exams for early detection of glaucoma and other disorders of the eye. Is the patient up to date with their annual eye exam?  Yes  Who is the provider or what is the name of the office in which the patient attends annual eye exams? Dr Dione Booze  If pt is not established with a provider, would they like to be referred to a provider to establish care? No .   Dental Screening: Recommended annual dental exams for proper oral hygiene  Community Resource Referral / Chronic Care Management: CRR required this visit?  No   CCM required this visit?  No      Plan:     I have personally reviewed and noted the following in the patient's chart:   Medical and social history Use of alcohol, tobacco or illicit drugs  Current medications and supplements including opioid prescriptions. Patient is not currently taking opioid prescriptions. Functional ability and status Nutritional status Physical activity Advanced directives List of other physicians Hospitalizations, surgeries, and ER visits in previous 12 months Vitals Screenings to include cognitive, depression, and falls Referrals and appointments  In addition, I have reviewed and discussed with patient certain preventive protocols, quality metrics, and best practice recommendations. A written personalized care plan for preventive services as well as general preventive health  recommendations were provided to patient.     Marzella Schlein, LPN   1/61/0960   Nurse Notes: none

## 2023-03-23 NOTE — Patient Instructions (Signed)
Megan Taylor , Thank you for taking time to come for your Medicare Wellness Visit. I appreciate your ongoing commitment to your health goals. Please review the following plan we discussed and let me know if I can assist you in the future.   These are the goals we discussed:  Goals      Patient Stated     Continue working out with trainer and exercise      Patient Stated     Continue to work out with trainer and exercise      Patient Stated     Stay healthy         This is a list of the screening recommended for you and due dates:  Health Maintenance  Topic Date Due   Zoster (Shingles) Vaccine (1 of 2) Never done   Pneumonia Vaccine (1 of 1 - PCV) 03/22/2024*   Mammogram  01/12/2024   Medicare Annual Wellness Visit  03/22/2024   DTaP/Tdap/Td vaccine (2 - Td or Tdap) 06/09/2027   Colon Cancer Screening  04/21/2028   DEXA scan (bone density measurement)  Completed   Hepatitis C Screening  Completed   HPV Vaccine  Aged Out   Flu Shot  Discontinued   COVID-19 Vaccine  Discontinued  *Topic was postponed. The date shown is not the original due date.    Advanced directives: Please bring a copy of your health care power of attorney and living will to the office at your convenience.  Conditions/risks identified: stay healthy   Next appointment: Follow up in one year for your annual wellness visit    Preventive Care 65 Years and Older, Female Preventive care refers to lifestyle choices and visits with your health care provider that can promote health and wellness. What does preventive care include? A yearly physical exam. This is also called an annual well check. Dental exams once or twice a year. Routine eye exams. Ask your health care provider how often you should have your eyes checked. Personal lifestyle choices, including: Daily care of your teeth and gums. Regular physical activity. Eating a healthy diet. Avoiding tobacco and drug use. Limiting alcohol use. Practicing  safe sex. Taking low-dose aspirin every day. Taking vitamin and mineral supplements as recommended by your health care provider. What happens during an annual well check? The services and screenings done by your health care provider during your annual well check will depend on your age, overall health, lifestyle risk factors, and family history of disease. Counseling  Your health care provider may ask you questions about your: Alcohol use. Tobacco use. Drug use. Emotional well-being. Home and relationship well-being. Sexual activity. Eating habits. History of falls. Memory and ability to understand (cognition). Work and work Astronomer. Reproductive health. Screening  You may have the following tests or measurements: Height, weight, and BMI. Blood pressure. Lipid and cholesterol levels. These may be checked every 5 years, or more frequently if you are over 77 years old. Skin check. Lung cancer screening. You may have this screening every year starting at age 82 if you have a 30-pack-year history of smoking and currently smoke or have quit within the past 15 years. Fecal occult blood test (FOBT) of the stool. You may have this test every year starting at age 53. Flexible sigmoidoscopy or colonoscopy. You may have a sigmoidoscopy every 5 years or a colonoscopy every 10 years starting at age 67. Hepatitis C blood test. Hepatitis B blood test. Sexually transmitted disease (STD) testing. Diabetes screening. This is done  by checking your blood sugar (glucose) after you have not eaten for a while (fasting). You may have this done every 1-3 years. Bone density scan. This is done to screen for osteoporosis. You may have this done starting at age 90. Mammogram. This may be done every 1-2 years. Talk to your health care provider about how often you should have regular mammograms. Talk with your health care provider about your test results, treatment options, and if necessary, the need for more  tests. Vaccines  Your health care provider may recommend certain vaccines, such as: Influenza vaccine. This is recommended every year. Tetanus, diphtheria, and acellular pertussis (Tdap, Td) vaccine. You may need a Td booster every 10 years. Zoster vaccine. You may need this after age 106. Pneumococcal 13-valent conjugate (PCV13) vaccine. One dose is recommended after age 32. Pneumococcal polysaccharide (PPSV23) vaccine. One dose is recommended after age 72. Talk to your health care provider about which screenings and vaccines you need and how often you need them. This information is not intended to replace advice given to you by your health care provider. Make sure you discuss any questions you have with your health care provider. Document Released: 10/25/2015 Document Revised: 06/17/2016 Document Reviewed: 07/30/2015 Elsevier Interactive Patient Education  2017 [employer].  Fall Prevention in the Home Falls can cause injuries. They can happen to people of all ages. There are many things you can do to make your home safe and to help prevent falls. What can I do on the outside of my home? Regularly fix the edges of walkways and driveways and fix any cracks. Remove anything that might make you trip as you walk through a door, such as a raised step or threshold. Trim any bushes or trees on the path to your home. Use bright outdoor lighting. Clear any walking paths of anything that might make someone trip, such as rocks or tools. Regularly check to see if handrails are loose or broken. Make sure that both sides of any steps have handrails. Any raised decks and porches should have guardrails on the edges. Have any leaves, snow, or ice cleared regularly. Use sand or salt on walking paths during winter. Clean up any spills in your garage right away. This includes oil or grease spills. What can I do in the bathroom? Use night lights. Install grab bars by the toilet and in the tub and shower.  Do not use towel bars as grab bars. Use non-skid mats or decals in the tub or shower. If you need to sit down in the shower, use a plastic, non-slip stool. Keep the floor dry. Clean up any water that spills on the floor as soon as it happens. Remove soap buildup in the tub or shower regularly. Attach bath mats securely with double-sided non-slip rug tape. Do not have throw rugs and other things on the floor that can make you trip. What can I do in the bedroom? Use night lights. Make sure that you have a light by your bed that is easy to reach. Do not use any sheets or blankets that are too big for your bed. They should not hang down onto the floor. Have a firm chair that has side arms. You can use this for support while you get dressed. Do not have throw rugs and other things on the floor that can make you trip. What can I do in the kitchen? Clean up any spills right away. Avoid walking on wet floors. Keep items that you use  a lot in easy-to-reach places. If you need to reach something above you, use a strong step stool that has a grab bar. Keep electrical cords out of the way. Do not use floor polish or wax that makes floors slippery. If you must use wax, use non-skid floor wax. Do not have throw rugs and other things on the floor that can make you trip. What can I do with my stairs? Do not leave any items on the stairs. Make sure that there are handrails on both sides of the stairs and use them. Fix handrails that are broken or loose. Make sure that handrails are as long as the stairways. Check any carpeting to make sure that it is firmly attached to the stairs. Fix any carpet that is loose or worn. Avoid having throw rugs at the top or bottom of the stairs. If you do have throw rugs, attach them to the floor with carpet tape. Make sure that you have a light switch at the top of the stairs and the bottom of the stairs. If you do not have them, ask someone to add them for you. What else  can I do to help prevent falls? Wear shoes that: Do not have high heels. Have rubber bottoms. Are comfortable and fit you well. Are closed at the toe. Do not wear sandals. If you use a stepladder: Make sure that it is fully opened. Do not climb a closed stepladder. Make sure that both sides of the stepladder are locked into place. Ask someone to hold it for you, if possible. Clearly mark and make sure that you can see: Any grab bars or handrails. First and last steps. Where the edge of each step is. Use tools that help you move around (mobility aids) if they are needed. These include: Canes. Walkers. Scooters. Crutches. Turn on the lights when you go into a dark area. Replace any light bulbs as soon as they burn out. Set up your furniture so you have a clear path. Avoid moving your furniture around. If any of your floors are uneven, fix them. If there are any pets around you, be aware of where they are. Review your medicines with your doctor. Some medicines can make you feel dizzy. This can increase your chance of falling. Ask your doctor what other things that you can do to help prevent falls. This information is not intended to replace advice given to you by your health care provider. Make sure you discuss any questions you have with your health care provider. Document Released: 07/25/2009 Document Revised: 03/05/2016 Document Reviewed: 11/02/2014 Elsevier Interactive Patient Education  2017 [employer].

## 2023-12-08 ENCOUNTER — Other Ambulatory Visit: Payer: Self-pay | Admitting: Physician Assistant

## 2023-12-08 DIAGNOSIS — Z0000 Encounter for general adult medical examination without abnormal findings: Secondary | ICD-10-CM

## 2024-01-13 ENCOUNTER — Ambulatory Visit
Admission: RE | Admit: 2024-01-13 | Discharge: 2024-01-13 | Disposition: A | Discharge status: Home / Self Care | Payer: Medicare Other | Source: Ambulatory Visit | Attending: Physician Assistant | Admitting: Physician Assistant

## 2024-01-13 DIAGNOSIS — Z0000 Encounter for general adult medical examination without abnormal findings: Secondary | ICD-10-CM

## 2024-01-13 DIAGNOSIS — Z1231 Encounter for screening mammogram for malignant neoplasm of breast: Secondary | ICD-10-CM | POA: Diagnosis not present

## 2024-02-22 DIAGNOSIS — H2513 Age-related nuclear cataract, bilateral: Secondary | ICD-10-CM | POA: Diagnosis not present

## 2024-02-22 DIAGNOSIS — H16223 Keratoconjunctivitis sicca, not specified as Sjogren's, bilateral: Secondary | ICD-10-CM | POA: Diagnosis not present

## 2024-02-22 DIAGNOSIS — H43392 Other vitreous opacities, left eye: Secondary | ICD-10-CM | POA: Diagnosis not present

## 2024-03-21 ENCOUNTER — Telehealth: Payer: Self-pay

## 2024-03-21 NOTE — Telephone Encounter (Signed)
 Copied from CRM [id number]. Topic: Clinical - Request for Lab/Test Order >> Mar 21, 2024 10:47 AM Megan Taylor wrote: Reason for CRM: Patient would like blood work to be completed before OV 6/16

## 2024-03-22 ENCOUNTER — Other Ambulatory Visit: Payer: Self-pay | Admitting: Physician Assistant

## 2024-03-22 DIAGNOSIS — E785 Hyperlipidemia, unspecified: Secondary | ICD-10-CM

## 2024-03-22 NOTE — Telephone Encounter (Signed)
 Please call patient to schedule lab appt. Orders are in Epic.

## 2024-03-23 ENCOUNTER — Other Ambulatory Visit

## 2024-03-23 DIAGNOSIS — E785 Hyperlipidemia, unspecified: Secondary | ICD-10-CM

## 2024-03-24 ENCOUNTER — Ambulatory Visit: Payer: Self-pay | Admitting: Physician Assistant

## 2024-03-24 LAB — COMPREHENSIVE METABOLIC PANEL WITH GFR
AG Ratio: 2.4 (calc) (ref 1.0–2.5)
ALT: 13 U/L (ref 6–29)
AST: 23 U/L (ref 10–35)
Albumin: 4.5 g/dL (ref 3.6–5.1)
Alkaline phosphatase (APISO): 47 U/L (ref 37–153)
BUN: 17 mg/dL (ref 7–25)
CO2: 26 mmol/L (ref 20–32)
Calcium: 9.5 mg/dL (ref 8.6–10.4)
Chloride: 103 mmol/L (ref 98–110)
Creat: 0.73 mg/dL (ref 0.60–1.00)
Globulin: 1.9 g/dL (ref 1.9–3.7)
Glucose, Bld: 102 mg/dL — ABNORMAL HIGH (ref 65–99)
Potassium: 3.9 mmol/L (ref 3.5–5.3)
Sodium: 139 mmol/L (ref 135–146)
Total Bilirubin: 0.7 mg/dL (ref 0.2–1.2)
Total Protein: 6.4 g/dL (ref 6.1–8.1)
eGFR: 86 mL/min/{1.73_m2} (ref >=60)

## 2024-03-24 LAB — CBC WITH DIFFERENTIAL/PLATELET
Absolute Lymphocytes: 1637 {cells}/uL (ref 850–3900)
Absolute Monocytes: 427 {cells}/uL (ref 200–950)
Basophils Absolute: 48 {cells}/uL (ref 0–200)
Basophils Relative: 1.1 %
Eosinophils Absolute: 70 {cells}/uL (ref 15–500)
Eosinophils Relative: 1.6 %
HCT: 43 % (ref 35.0–45.0)
Hemoglobin: 14 g/dL (ref 11.7–15.5)
MCH: 32 pg (ref 27.0–33.0)
MCHC: 32.6 g/dL (ref 32.0–36.0)
MCV: 98.2 fL (ref 80.0–100.0)
MPV: 10 fL (ref 7.5–12.5)
Monocytes Relative: 9.7 %
Neutro Abs: 2218 {cells}/uL (ref 1500–7800)
Neutrophils Relative %: 50.4 %
Platelets: 216 10*3/uL (ref 140–400)
RBC: 4.38 10*6/uL (ref 3.80–5.10)
RDW: 11.9 % (ref 11.0–15.0)
Total Lymphocyte: 37.2 %
WBC: 4.4 10*3/uL (ref 3.8–10.8)

## 2024-03-24 LAB — LIPID PANEL
Cholesterol: 259 mg/dL — ABNORMAL HIGH (ref <200)
HDL: 110 mg/dL (ref >=50)
LDL Cholesterol (Calc): 134 mg/dL — ABNORMAL HIGH
Non-HDL Cholesterol (Calc): 149 mg/dL — ABNORMAL HIGH (ref <130)
Total CHOL/HDL Ratio: 2.4 (calc) (ref <5.0)
Triglycerides: 62 mg/dL (ref <150)

## 2024-03-27 ENCOUNTER — Encounter: Payer: Self-pay | Admitting: Physician Assistant

## 2024-03-27 ENCOUNTER — Ambulatory Visit (INDEPENDENT_AMBULATORY_CARE_PROVIDER_SITE_OTHER): Admitting: Physician Assistant

## 2024-03-27 VITALS — BP 102/60 | HR 71 | Temp 97.9°F | Ht 63.0 in | Wt 98.0 lb

## 2024-03-27 DIAGNOSIS — Z85820 Personal history of malignant melanoma of skin: Secondary | ICD-10-CM

## 2024-03-27 DIAGNOSIS — M8580 Other specified disorders of bone density and structure, unspecified site: Secondary | ICD-10-CM | POA: Diagnosis not present

## 2024-03-27 DIAGNOSIS — N951 Menopausal and female climacteric states: Secondary | ICD-10-CM

## 2024-03-27 DIAGNOSIS — E785 Hyperlipidemia, unspecified: Secondary | ICD-10-CM | POA: Diagnosis not present

## 2024-03-27 NOTE — Progress Notes (Signed)
 Subjective:    [name] is a 78 y.o. female and is here for a comprehensive physical exam.  HPI  Health Maintenance Due  Topic Date Due   Medicare Annual Wellness (AWV)  03/22/2024    Acute Concerns: No acute concerns reported today.  Chronic Issues: Osteopenia Pt complains of an issue with her hip. Endorses that her personal trainer helps her with lower body strength. She would like a new order for a bone density exam.  Elevated cholesterol Per pt, her cholesterol levels have always been high. Pt was informed of a calcium score and other tests/imaging but is not interested at this time.  Health Maintenance: Immunizations -- UTD Colonoscopy -- UTD, last done 04/21/2018 and results show diverticulosis in the left colon and normal mucosa that was biopsied. 10 year repeat recommended, next due 04/21/2028. Mammogram -- UTD, last done 01/13/2024 and results show no mammographic evidence of malignancy. 1 year repeat recommended, next due 01/12/2025. PAP -- Last done 05/25/2016 and results were NILM. Bone Density -- Overdue, last done 01/21/2021 and results show osteopenia. 2 year repeat recommended, due 01/22/2023. Diet -- Overall healthy diet with efforts towards high fiber intake. Also takes supplements. She says her and her husband will get a treat once in a while. Exercise -- Regular exercise habits reported, goes with a personal trainer three times a week. Also walks 5 miles with husband per week.  Sleep habits -- Good sleeping habits reported. Mood -- Stable.  UTD with dentist? - UTD, will see one next week. UTD with eye doctor? - UTD, saw one the other day. UTD with dermatology? - Currently looking for one, would like a referral for a female dermatologist. Last saw one 3 years ago and notes hx of melanoma.   Weight history: Wt Readings from Last 10 Encounters:  03/27/24 98 lb (44.5 kg)  03/23/23 102 lb (46.3 kg)  04/30/22 102 lb 8 oz (46.5 kg)  03/18/21 103 lb (46.7  kg)  01/14/21 105 lb 8 oz (47.9 kg)  11/21/20 104 lb 8 oz (47.4 kg)  02/06/20 108 lb (49 kg)  11/06/19 108 lb (49 kg)  05/16/19 107 lb 6.4 oz (48.7 kg)  11/07/18 109 lb 9.6 oz (49.7 kg)   Body mass index is 17.36 kg/m. Patient's last menstrual period was 10/12/1997 (lmp unknown).  Alcohol use:  reports current alcohol use of about 5.0 - 6.0 standard drinks of alcohol per week.  Tobacco use:  Tobacco Use: Low Risk  (03/27/2024)   Patient History    Smoking Tobacco Use: Never    Smokeless Tobacco Use: Never    Passive Exposure: Not on file   Eligible for lung cancer screening? no     03/27/2024   11:19 AM  Depression screen PHQ 2/9  Decreased Interest 0  Down, Depressed, Hopeless 0  PHQ - 2 Score 0     Other providers/specialists: Patient Care Team: Megan Taylor, [place] as PCP - General (Physician Assistant) Megan Taylor, Megan Coombes, LCSW (Inactive) as Social Worker (Licensed Clinical Social Worker) Megan Taylor, Megan Dessert, MD as Consulting Physician (Gastroenterology) Megan Keeling, MD as Consulting Physician (Dermatology) Megan Taylor, Megan Bouche, MD as Consulting Physician (Ophthalmology) Megan Leatherwood, MD as Consulting Physician (Obstetrics and Gynecology) Megan Rei, MD as Consulting Physician (Orthopedic Surgery) Megan Darling, DO as Consulting Physician (Sports Medicine)    PMHx, SurgHx, SocialHx, Medications, and Allergies were reviewed in the Visit Navigator and updated as appropriate.   Past Medical  History:  Diagnosis Date   Dry eyes    uses Restasis eye drops.   Hip pain    bursitis right hip   Melanoma (HCC) 2011   R ankle; sees dermatology regularly   Osteopenia    was on fosamax for awhile   Osteoporosis osteopenia   Syncope and collapse 10/01/2016   Tremor    Wolff-Parkinson-White (WPW) syndrome 1992   heart ablation     Past Surgical History:  Procedure Laterality Date   BREAST BIOPSY Left    BREAST CYST ASPIRATION      benign   BREAST EXCISIONAL BIOPSY Right 1975   benign lump   BUNIONECTOMY Left    COLONOSCOPY     DILATION AND CURETTAGE OF UTERUS  1977   with BTL   EXCISION/RELEASE BURSA HIP Right 02/17/2016   Procedure: RIGHT HIP BURSECTOMY GLUTEAL TENDON REPAIR;  Surgeon: Megan Rei, MD;  Location: [facility];  Service: Orthopedics;  Laterality: Right;   [place]   LUMBAR DISC SURGERY  2018   [place]  - decompression, foraminectomy.    MELANOMA EXCISION Right 06/2009   right ankle   SPINE SURGERY  2016 Decompressed nerve   TONSILLECTOMY     TUBAL LIGATION  1977   WISDOM TOOTH EXTRACTION       Family History  Problem Relation Age of Onset   Hypertension Mother    Hyperlipidemia Mother    Congestive Heart Failure Mother    Lung cancer Father 42   Cancer Father    Osteoporosis Sister 61   Hypertension Maternal Grandmother    Hyperlipidemia Maternal Grandmother    Hodgkin's lymphoma Paternal Grandfather    Congestive Heart Failure Paternal Grandmother    Tremor Daughter    Intellectual disability Sister    Colon cancer Neg Hx    Esophageal cancer Neg Hx    Liver cancer Neg Hx    Pancreatic cancer Neg Hx    Rectal cancer Neg Hx    Stomach cancer Neg Hx     Social History   Tobacco Use   Smoking status: Never   Smokeless tobacco: Never  Vaping Use   Vaping status: Never Used  Substance Use Topics   Alcohol use: Yes    Alcohol/week: 5.0 - 6.0 standard drinks of alcohol    Types: 5 - 6 Standard drinks or equivalent per week    Comment: wine   Drug use: No    Review of Systems:   Review of Systems  Constitutional:  Negative for chills, fever, malaise/fatigue and weight loss.  HENT:  Negative for hearing loss, sinus pain and sore throat.   Respiratory:  Negative for cough and hemoptysis.   Cardiovascular:  Negative for chest pain, palpitations, leg swelling and PND.  Gastrointestinal:  Negative for abdominal pain,  constipation, diarrhea, heartburn, nausea and vomiting.  Genitourinary:  Negative for dysuria, frequency and urgency.  Musculoskeletal:  Negative for back pain, myalgias and neck pain.  Skin:  Negative for itching and rash.  Neurological:  Negative for dizziness, tingling, seizures and headaches.  Endo/Heme/Allergies:  Negative for polydipsia.  Psychiatric/Behavioral:  Negative for depression. The patient is not nervous/anxious.      Objective:   BP 102/60 (BP Location: Left Arm, Patient Position: Sitting, Cuff Size: Normal)   Pulse 71   Temp 97.9 F (36.6 C) (Temporal)   Ht 5' 3 (1.6 m)   Wt 98 lb (44.5 kg)   LMP 10/12/1997 (LMP Unknown)  SpO2 99%   BMI 17.36 kg/m  Body mass index is 17.36 kg/m.   General Appearance:    Alert, cooperative, no distress, appears stated age  Head:    Normocephalic, without obvious abnormality, atraumatic  Eyes:    PERRL, conjunctiva/corneas clear, EOM's intact, fundi    benign, both eyes  Ears:    Normal TM's and external ear canals, both ears  Nose:   Nares normal, septum midline, mucosa normal, no drainage    or sinus tenderness  Throat:   Lips, mucosa, and tongue normal; teeth and gums normal  Neck:   Supple, symmetrical, trachea midline, no adenopathy;    thyroid :  no enlargement/tenderness/nodules; no carotid   bruit or JVD  Back:     Symmetric, no curvature, ROM normal, no CVA tenderness  Lungs:     Clear to auscultation bilaterally, respirations unlabored  Chest Wall:    No tenderness or deformity   Heart:    Regular rate and rhythm, S1 and S2 normal, no murmur, rub or gallop  Breast Exam:    Deferred  Abdomen:     Soft, non-tender, bowel sounds active all four quadrants,    no masses, no organomegaly  Genitalia:    Deferred  Extremities:   Extremities normal, atraumatic, no cyanosis or edema  Pulses:   2+ and symmetric all extremities  Skin:   Skin color, texture, turgor normal, no rashes or lesions  Lymph nodes:   Cervical,  supraclavicular, and axillary nodes normal  Neurologic:   CNII-XII intact, normal strength, sensation and reflexes    throughout    Assessment/Plan:   Osteopenia, unspecified location; Menopausal and female climacteric states Will place referral for updating bone density scan Continue efforts at calcium and vitamin D intake as well as strength training  History of melanoma I have placed new referral for dermatology for patient to get back into her screenings  Hyperlipidemia Consider calcium score for further evaluation and management, information provided   I, Timoteo Force, acting as a Neurosurgeon for [employer], [place]., have documented all relevant documentation on the behalf of Megan Taylor, [place], as directed by  Megan Iba, PA while in the presence of Megan Taylor, [place].  I, Darl Edu, have reviewed all documentation for this visit. The documentation on 03/27/24 for the exam, diagnosis, procedures, and orders are all accurate and complete.  Megan Iba, PA-C [facility]

## 2024-03-27 NOTE — Patient Instructions (Signed)
 It was great to see you!  We will order a bone density scan to follow-up on your prior osteopenia  Consider calcium score, please see attached handout These tests are currently $99 for [place] out-of-pocket  I have placed a dermatology referral for you  Let's follow-up in 1 year, sooner if you have concerns.  Take care,  Alexander Iba PA-C

## 2024-03-28 ENCOUNTER — Ambulatory Visit: Payer: Medicare Other

## 2024-04-11 ENCOUNTER — Ambulatory Visit (INDEPENDENT_AMBULATORY_CARE_PROVIDER_SITE_OTHER)
Admission: RE | Admit: 2024-04-11 | Discharge: 2024-04-11 | Disposition: A | Discharge status: Home / Self Care | Source: Ambulatory Visit | Attending: Physician Assistant

## 2024-04-11 ENCOUNTER — Ambulatory Visit: Payer: Self-pay | Admitting: Physician Assistant

## 2024-04-11 DIAGNOSIS — N951 Menopausal and female climacteric states: Secondary | ICD-10-CM

## 2024-04-11 DIAGNOSIS — M8580 Other specified disorders of bone density and structure, unspecified site: Secondary | ICD-10-CM | POA: Diagnosis not present

## 2024-04-27 DIAGNOSIS — M810 Age-related osteoporosis without current pathological fracture: Secondary | ICD-10-CM | POA: Diagnosis not present

## 2024-04-27 DIAGNOSIS — M9908 Segmental and somatic dysfunction of rib cage: Secondary | ICD-10-CM | POA: Diagnosis not present

## 2024-04-27 DIAGNOSIS — M9904 Segmental and somatic dysfunction of sacral region: Secondary | ICD-10-CM | POA: Diagnosis not present

## 2024-04-27 DIAGNOSIS — M9906 Segmental and somatic dysfunction of lower extremity: Secondary | ICD-10-CM | POA: Diagnosis not present

## 2024-04-27 DIAGNOSIS — M5459 Other low back pain: Secondary | ICD-10-CM | POA: Diagnosis not present

## 2024-04-27 DIAGNOSIS — M9903 Segmental and somatic dysfunction of lumbar region: Secondary | ICD-10-CM | POA: Diagnosis not present

## 2024-04-27 DIAGNOSIS — M25561 Pain in right knee: Secondary | ICD-10-CM | POA: Diagnosis not present

## 2024-04-27 DIAGNOSIS — M9905 Segmental and somatic dysfunction of pelvic region: Secondary | ICD-10-CM | POA: Diagnosis not present

## 2024-04-27 DIAGNOSIS — M6281 Muscle weakness (generalized): Secondary | ICD-10-CM | POA: Diagnosis not present

## 2024-05-18 ENCOUNTER — Ambulatory Visit: Admitting: Dermatology

## 2024-06-01 ENCOUNTER — Encounter: Payer: Self-pay | Admitting: Dermatology

## 2024-06-01 ENCOUNTER — Ambulatory Visit (INDEPENDENT_AMBULATORY_CARE_PROVIDER_SITE_OTHER): Admitting: Dermatology

## 2024-06-01 VITALS — BP 107/76 | HR 70

## 2024-06-01 DIAGNOSIS — L570 Actinic keratosis: Secondary | ICD-10-CM | POA: Diagnosis not present

## 2024-06-01 DIAGNOSIS — W908XXA Exposure to other nonionizing radiation, initial encounter: Secondary | ICD-10-CM

## 2024-06-01 DIAGNOSIS — Z79899 Other long term (current) drug therapy: Secondary | ICD-10-CM

## 2024-06-01 DIAGNOSIS — D1801 Hemangioma of skin and subcutaneous tissue: Secondary | ICD-10-CM | POA: Diagnosis not present

## 2024-06-01 DIAGNOSIS — Z1283 Encounter for screening for malignant neoplasm of skin: Secondary | ICD-10-CM | POA: Diagnosis not present

## 2024-06-01 DIAGNOSIS — L821 Other seborrheic keratosis: Secondary | ICD-10-CM | POA: Diagnosis not present

## 2024-06-01 DIAGNOSIS — L578 Other skin changes due to chronic exposure to nonionizing radiation: Secondary | ICD-10-CM | POA: Diagnosis not present

## 2024-06-01 DIAGNOSIS — Z85820 Personal history of malignant melanoma of skin: Secondary | ICD-10-CM | POA: Diagnosis not present

## 2024-06-01 DIAGNOSIS — D485 Neoplasm of uncertain behavior of skin: Secondary | ICD-10-CM

## 2024-06-01 DIAGNOSIS — L649 Androgenic alopecia, unspecified: Secondary | ICD-10-CM

## 2024-06-01 DIAGNOSIS — D2371 Other benign neoplasm of skin of right lower limb, including hip: Secondary | ICD-10-CM

## 2024-06-01 DIAGNOSIS — L814 Other melanin hyperpigmentation: Secondary | ICD-10-CM | POA: Diagnosis not present

## 2024-06-01 DIAGNOSIS — D229 Melanocytic nevi, unspecified: Secondary | ICD-10-CM

## 2024-06-01 MED ORDER — MINOXIDIL 2.5 MG PO TABS: 1.2500 mg | ORAL_TABLET | Freq: Every day | ORAL | 1 refills | Status: AC

## 2024-06-01 NOTE — Patient Instructions (Addendum)

## 2024-06-01 NOTE — Progress Notes (Unsigned)
 New Patient Visit   Subjective  Megan Taylor is a 78 y.o. female who presents for the following: Skin Cancer Screening and Full Body Skin Exam.  Hx of MM many years ago on right lower anterior leg, treated surgically.  The patient presents for Total-Body Skin Exam (TBSE) for skin cancer screening and mole check. The patient has spots, moles and lesions to be evaluated, some may be new or changing.  The following portions of the chart were reviewed this encounter and updated as appropriate: medications, allergies, medical history  Review of Systems:  No other skin or systemic complaints except as noted in HPI or Assessment and Plan.  Objective  Well appearing patient in no apparent distress; mood and affect are within normal limits.  A full examination was performed including scalp, head, eyes, ears, nose, lips, neck, chest, axillae, abdomen, back, buttocks, bilateral upper extremities, bilateral lower extremities, hands, feet, fingers, toes, fingernails, and toenails. All findings within normal limits unless otherwise noted below.   Relevant physical exam findings are noted in the Assessment and Plan.  Right Lower Leg - Anterior 5mm Erythematous firm nodule   Assessment & Plan   SKIN CANCER SCREENING PERFORMED TODAY.  ACTINIC DAMAGE - Chronic condition, secondary to cumulative UV/sun exposure - diffuse scaly erythematous macules with underlying dyspigmentation - Recommend daily broad spectrum sunscreen SPF 30+ to sun-exposed areas, reapply every 2 hours as needed.  - Staying in the shade or wearing long sleeves, sun glasses (UVA+UVB protection) and wide brim hats (4-inch brim around the entire circumference of the hat) are also recommended for sun protection.  - Call for new or changing lesions.  LENTIGINES, SEBORRHEIC KERATOSES, HEMANGIOMAS - Benign normal skin lesions - Benign-appearing - Call for any changes  MELANOCYTIC NEVI - Tan-Squier and/or pink-flesh-colored  symmetric macules and papules - Benign appearing on exam today - Observation - Call clinic for new or changing moles - Recommend daily use of broad spectrum spf 30+ sunscreen to sun-exposed areas.   ACTINIC KERATOSIS Exam: Erythematous thin papules/macules with gritty scale  Actinic keratoses are precancerous spots that appear secondary to cumulative UV radiation exposure/sun exposure over time. They are chronic with expected duration over 1 year. A portion of actinic keratoses will progress to squamous cell carcinoma of the skin. It is not possible to reliably predict which spots will progress to skin cancer and so treatment is recommended to prevent development of skin cancer.  Recommend daily broad spectrum sunscreen SPF 30+ to sun-exposed areas, reapply every 2 hours as needed.  Recommend staying in the shade or wearing long sleeves, sun glasses (UVA+UVB protection) and wide brim hats (4-inch brim around the entire circumference of the hat). Call for new or changing lesions.  Treatment Plan:  Prior to procedure, discussed risks of blister formation, small wound, skin dyspigmentation, or rare scar following cryotherapy. Recommend Vaseline ointment to treated areas while healing.  Destruction Procedure Note Destruction method: cryotherapy   Informed consent: discussed and consent obtained   Lesion destroyed using liquid nitrogen: Yes   Outcome: patient tolerated procedure well with no complications   Post-procedure details: wound care instructions given   Locations: cheek # of Lesions Treated: 1   ANDROGENETIC ALOPECIA (FEMALE PATTERN HAIR LOSS) Exam: Diffuse thinning of the crown and widening of the midline part with retention of the frontal hairline  Flared  Female Androgenic Alopecia is a chronic condition related to genetics and/or hormonal changes.  In women androgenetic alopecia is commonly associated with menopause but  may occur any time after puberty.  It causes hair  thinning primarily on the crown with widening of the part and temporal hairline recession.  Can use OTC Rogaine  (minoxidil ) 5% solution/foam as directed.  - Recommend starting hairloss supplements (I.e Viviscal vs Nutrafol) Oral treatments in female patients who have no contraindication may include : - Low dose oral minoxidil  1.25 - 5mg  daily  Treatment Plan: Start taking oral minoxidil  1.25mg  daily for -3 months then increase to full tablet. -Use topical minoxidil  5%  Long term medication management.  Patient is using long term (months to years) prescription medication  to control their dermatologic condition.  These medications require periodic monitoring to evaluate for efficacy and side effects and may require periodic laboratory monitoring.    HISTORY OF MELANOMA - No evidence of recurrence today - No lymphadenopathy - Recommend regular full body skin exams - Recommend daily broad spectrum sunscreen SPF 30+ to sun-exposed areas, reapply every 2 hours as needed.  - Call if any new or changing lesions are noted between office visits     NEOPLASM OF UNCERTAIN BEHAVIOR OF SKIN Right Lower Leg - Anterior Skin / nail biopsy Type of biopsy: tangential   Informed consent: discussed and consent obtained   Timeout: patient name, date of birth, surgical site, and procedure verified   Procedure prep:  Patient was prepped and draped in usual sterile fashion Prep type:  Isopropyl alcohol Anesthesia: the lesion was anesthetized in a standard fashion   Anesthetic:  1% lidocaine  w/ epinephrine 1-100,000 buffered w/ 8.4% NaHCO3 Instrument used: DermaBlade   Hemostasis achieved with: aluminum chloride   Outcome: patient tolerated procedure well   Post-procedure details: sterile dressing applied and wound care instructions given   Dressing type: petrolatum gauze and bandage    Specimen 1 - Surgical pathology Differential Diagnosis: r/o df vs other  Check Margins: No SEBORRHEIC  KERATOSIS   LENTIGINES   CHERRY ANGIOMA   ANDROGENETIC ALOPECIA   PERSONAL HISTORY OF MALIGNANT MELANOMA OF SKIN   ACTINIC KERATOSES   ACTINIC SKIN DAMAGE   MULTIPLE BENIGN NEVI    Return in about 1 year (around 06/01/2025) for TBSC and a apt for hair loss with Dr. CHARM FERNS, Berwyn Lesches, Surg Tech III, am acting as scribe for RUFUS CHRISTELLA HOLY, MD.   Documentation: I have reviewed the above documentation for accuracy and completeness, and I agree with the above.  RUFUS CHRISTELLA HOLY, MD

## 2024-06-05 ENCOUNTER — Ambulatory Visit: Payer: Self-pay | Admitting: Dermatology

## 2024-06-05 LAB — SURGICAL PATHOLOGY

## 2024-06-06 DIAGNOSIS — M25551 Pain in right hip: Secondary | ICD-10-CM | POA: Diagnosis not present

## 2024-06-06 DIAGNOSIS — M9906 Segmental and somatic dysfunction of lower extremity: Secondary | ICD-10-CM | POA: Diagnosis not present

## 2024-06-06 DIAGNOSIS — M25561 Pain in right knee: Secondary | ICD-10-CM | POA: Diagnosis not present

## 2024-08-03 ENCOUNTER — Encounter: Payer: Self-pay | Admitting: Physician Assistant

## 2024-08-07 ENCOUNTER — Telehealth: Payer: Self-pay | Admitting: *Deleted

## 2024-08-07 NOTE — Telephone Encounter (Signed)
 Copied from CRM [id number]. Topic: Clinical - Request for Lab/Test Order >> Aug 07, 2024  3:04 PM Anairis L wrote: Reason for CRM: 08/09/2024 Lab app please add lab order.Patient would also like to check thyroid  levels,Vitamin D, and Hormones .

## 2024-08-07 NOTE — Telephone Encounter (Signed)
 Please see message and advise lab work.

## 2024-08-08 ENCOUNTER — Other Ambulatory Visit: Payer: Self-pay | Admitting: Physician Assistant

## 2024-08-08 DIAGNOSIS — N951 Menopausal and female climacteric states: Secondary | ICD-10-CM

## 2024-08-08 DIAGNOSIS — F419 Anxiety disorder, unspecified: Secondary | ICD-10-CM

## 2024-08-08 DIAGNOSIS — E559 Vitamin D deficiency, unspecified: Secondary | ICD-10-CM

## 2024-08-09 ENCOUNTER — Other Ambulatory Visit (INDEPENDENT_AMBULATORY_CARE_PROVIDER_SITE_OTHER)

## 2024-08-09 DIAGNOSIS — F419 Anxiety disorder, unspecified: Secondary | ICD-10-CM | POA: Diagnosis not present

## 2024-08-09 DIAGNOSIS — E559 Vitamin D deficiency, unspecified: Secondary | ICD-10-CM | POA: Diagnosis not present

## 2024-08-09 DIAGNOSIS — N951 Menopausal and female climacteric states: Secondary | ICD-10-CM | POA: Diagnosis not present

## 2024-08-09 DIAGNOSIS — F32A Depression, unspecified: Secondary | ICD-10-CM

## 2024-08-09 LAB — VITAMIN D 25 HYDROXY (VIT D DEFICIENCY, FRACTURES): VITD: 85.37 ng/mL (ref 30.00–100.00)

## 2024-08-09 LAB — TSH: TSH: 2.69 u[IU]/mL (ref 0.35–5.50)

## 2024-08-10 ENCOUNTER — Ambulatory Visit: Payer: Self-pay | Admitting: Physician Assistant

## 2024-10-10 ENCOUNTER — Encounter: Payer: Self-pay | Admitting: Dermatology

## 2024-10-24 ENCOUNTER — Encounter: Payer: Self-pay | Admitting: Physician Assistant

## 2024-10-24 NOTE — Telephone Encounter (Signed)
 Megan Taylor, does pt need to schedule Colonoscopy?

## 2024-11-09 ENCOUNTER — Ambulatory Visit: Admitting: Dermatology

## 2025-06-05 ENCOUNTER — Ambulatory Visit: Admitting: Dermatology
